# Patient Record
Sex: Female | Born: 1940 | Race: White | Hispanic: No | Smoking: Former smoker
Health system: Southern US, Community
[De-identification: ages and names within clinical notes are randomized; demographics above are authoritative.]

## PROBLEM LIST (undated history)

## (undated) DIAGNOSIS — E7800 Pure hypercholesterolemia, unspecified: Secondary | ICD-10-CM

## (undated) DIAGNOSIS — G8929 Other chronic pain: Secondary | ICD-10-CM

## (undated) DIAGNOSIS — W19XXXA Unspecified fall, initial encounter: Secondary | ICD-10-CM

## (undated) DIAGNOSIS — K219 Gastro-esophageal reflux disease without esophagitis: Secondary | ICD-10-CM

## (undated) DIAGNOSIS — E079 Disorder of thyroid, unspecified: Secondary | ICD-10-CM

## (undated) DIAGNOSIS — M0820 Juvenile rheumatoid arthritis with systemic onset, unspecified site: Secondary | ICD-10-CM

## (undated) DIAGNOSIS — I10 Essential (primary) hypertension: Secondary | ICD-10-CM

## (undated) DIAGNOSIS — M545 Low back pain, unspecified: Secondary | ICD-10-CM

## (undated) DIAGNOSIS — R269 Unspecified abnormalities of gait and mobility: Principal | ICD-10-CM

## (undated) DIAGNOSIS — D649 Anemia, unspecified: Secondary | ICD-10-CM

## (undated) DIAGNOSIS — Z86718 Personal history of other venous thrombosis and embolism: Secondary | ICD-10-CM

## (undated) HISTORY — DX: Unspecified abnormalities of gait and mobility: R26.9

## (undated) HISTORY — DX: Low back pain: M54.5

## (undated) HISTORY — DX: Personal history of other venous thrombosis and embolism: Z86.718

## (undated) HISTORY — DX: Other chronic pain: G89.29

## (undated) HISTORY — DX: Low back pain, unspecified: M54.50

## (undated) HISTORY — PX: CARPAL TUNNEL RELEASE: SHX101

## (undated) HISTORY — PX: TONSILLECTOMY: SUR1361

## (undated) HISTORY — PX: ABDOMINAL HYSTERECTOMY: SHX81

## (undated) HISTORY — PX: SHOULDER SURGERY: SHX246

## (undated) HISTORY — PX: JOINT REPLACEMENT: SHX530

## (undated) HISTORY — PX: APPENDECTOMY: SHX54

---

## 1999-11-13 ENCOUNTER — Encounter: Admission: RE | Admit: 1999-11-13 | Discharge: 1999-11-13 | Payer: Self-pay

## 2000-07-22 ENCOUNTER — Other Ambulatory Visit: Admission: RE | Admit: 2000-07-22 | Discharge: 2000-07-22 | Payer: Self-pay | Admitting: Orthopedic Surgery

## 2002-12-21 ENCOUNTER — Encounter: Payer: Self-pay | Admitting: Orthopedic Surgery

## 2002-12-22 ENCOUNTER — Inpatient Hospital Stay (HOSPITAL_COMMUNITY): Admission: RE | Admit: 2002-12-22 | Discharge: 2002-12-24 | Payer: Self-pay | Admitting: Orthopedic Surgery

## 2002-12-22 ENCOUNTER — Encounter (INDEPENDENT_AMBULATORY_CARE_PROVIDER_SITE_OTHER): Payer: Self-pay | Admitting: Specialist

## 2006-04-22 ENCOUNTER — Ambulatory Visit: Payer: Self-pay | Admitting: Internal Medicine

## 2006-05-13 ENCOUNTER — Ambulatory Visit: Payer: Self-pay | Admitting: Internal Medicine

## 2009-05-14 ENCOUNTER — Emergency Department (HOSPITAL_BASED_OUTPATIENT_CLINIC_OR_DEPARTMENT_OTHER): Admission: EM | Admit: 2009-05-14 | Discharge: 2009-05-14 | Payer: Self-pay | Admitting: Emergency Medicine

## 2010-08-11 ENCOUNTER — Inpatient Hospital Stay (HOSPITAL_COMMUNITY): Admission: RE | Admit: 2010-08-11 | Discharge: 2010-08-15 | Payer: Self-pay | Admitting: Orthopedic Surgery

## 2011-01-08 LAB — URINALYSIS, ROUTINE W REFLEX MICROSCOPIC
Bilirubin Urine: NEGATIVE
Ketones, ur: NEGATIVE mg/dL
Protein, ur: NEGATIVE mg/dL
Urobilinogen, UA: 0.2 mg/dL (ref 0.0–1.0)

## 2011-01-08 LAB — BASIC METABOLIC PANEL
BUN: 11 mg/dL (ref 6–23)
Chloride: 106 mEq/L (ref 96–112)
Glucose, Bld: 92 mg/dL (ref 70–99)
Potassium: 4.6 mEq/L (ref 3.5–5.1)

## 2011-01-08 LAB — CBC
HCT: 37.1 % (ref 36.0–46.0)
MCH: 28 pg (ref 26.0–34.0)
MCHC: 31.8 g/dL (ref 30.0–36.0)
MCV: 88.1 fL (ref 78.0–100.0)
RDW: 15.2 % (ref 11.5–15.5)

## 2011-01-08 LAB — PROTIME-INR
INR: 0.97 (ref 0.00–1.49)
Prothrombin Time: 13.1 seconds (ref 11.6–15.2)

## 2011-01-08 LAB — DIFFERENTIAL
Basophils Absolute: 0.1 10*3/uL (ref 0.0–0.1)
Eosinophils Relative: 3 % (ref 0–5)
Lymphocytes Relative: 33 % (ref 12–46)
Monocytes Absolute: 0.6 10*3/uL (ref 0.1–1.0)

## 2011-01-08 LAB — SURGICAL PCR SCREEN: Staphylococcus aureus: NEGATIVE

## 2011-01-08 LAB — APTT: aPTT: 31 seconds (ref 24–37)

## 2011-01-12 ENCOUNTER — Inpatient Hospital Stay (HOSPITAL_COMMUNITY): Payer: Medicare Other

## 2011-01-12 ENCOUNTER — Inpatient Hospital Stay (HOSPITAL_COMMUNITY)
Admission: RE | Admit: 2011-01-12 | Discharge: 2011-01-15 | DRG: 554 | Disposition: A | Discharge status: Home / Self Care | Payer: Medicare Other | Source: Ambulatory Visit | Attending: Orthopedic Surgery | Admitting: Orthopedic Surgery

## 2011-01-12 DIAGNOSIS — Z7982 Long term (current) use of aspirin: Secondary | ICD-10-CM

## 2011-01-12 DIAGNOSIS — M19019 Primary osteoarthritis, unspecified shoulder: Principal | ICD-10-CM | POA: Diagnosis present

## 2011-01-12 DIAGNOSIS — D62 Acute posthemorrhagic anemia: Secondary | ICD-10-CM | POA: Diagnosis not present

## 2011-01-13 LAB — BASIC METABOLIC PANEL
Calcium: 8.5 mg/dL (ref 8.4–10.5)
Creatinine, Ser: 0.89 mg/dL (ref 0.4–1.2)
GFR calc Af Amer: >60 mL/min (ref >60)
GFR calc non Af Amer: >60 mL/min (ref >60)
Glucose, Bld: 99 mg/dL (ref 70–99)
Sodium: 138 mEq/L (ref 135–145)

## 2011-01-14 LAB — BASIC METABOLIC PANEL
CO2: 25 mEq/L (ref 19–32)
Calcium: 8.4 mg/dL (ref 8.4–10.5)
Creatinine, Ser: 0.81 mg/dL (ref 0.4–1.2)
GFR calc Af Amer: >60 mL/min (ref >60)
GFR calc non Af Amer: >60 mL/min (ref >60)
Sodium: 138 mEq/L (ref 135–145)

## 2011-01-14 LAB — CBC
Hemoglobin: 6.9 g/dL — CL (ref 12.0–15.0)
MCHC: 32.4 g/dL (ref 30.0–36.0)
Platelets: 226 10*3/uL (ref 150–400)

## 2011-01-14 LAB — PREPARE RBC (CROSSMATCH)

## 2011-01-15 LAB — TYPE AND SCREEN: Unit division: 0

## 2011-01-15 LAB — CBC
MCV: 84 fL (ref 78.0–100.0)
Platelets: 222 10*3/uL (ref 150–400)
RBC: 3.51 MIL/uL — ABNORMAL LOW (ref 3.87–5.11)
RDW: 15.8 % — ABNORMAL HIGH (ref 11.5–15.5)
WBC: 8.7 10*3/uL (ref 4.0–10.5)

## 2011-01-23 NOTE — Op Note (Signed)
NAME:  Rhonda Davenport, Rhonda Davenport                ACCOUNT NO.:  [id number]  MEDICAL RECORD NO.:  [id number]           PATIENT TYPE:  I  LOCATION:  5015                         FACILITY:  [facility]  PHYSICIAN:  [name], M.D. DATE OF BIRTH:  07-Feb-1941  DATE OF PROCEDURE:  01/12/2011 DATE OF DISCHARGE:                              OPERATIVE REPORT   PREOPERATIVE DIAGNOSIS:  Right shoulder severe rotator cuff arthropathy.  POSTOPERATIVE DIAGNOSIS:  Right shoulder severe rotator cuff arthropathy.  PROCEDURE PERFORMED:  Right shoulder reverse total shoulder arthroplasty using DePuy Delta Xtend system.  ATTENDING SURGEON:  [name], MD  ASSISTANT:  Donnie Coffin. Dixon, PA  General anesthesia plus interscalene block anesthesia was used.  BLOOD LOSS:  200 mL.  FLUID REPLACEMENT:  Crystalloid 1500 mL.  INSTRUMENT COUNTS:  Correct.  There were no complications.  Perioperative antibiotics were given.  INDICATIONS:  The patient is a 70 year old female with a history of worsening right shoulder pain secondary to severe end-stage right shoulder rotator cuff arthropathy.  The patient has failed an extensive period of conservative management.  She has had a prior reverse performed on her left shoulder and has done well with that.  She now has disabling pain and functional limitations, desiring total shoulder arthroplasty on the right.  Informed consent was obtained.  DESCRIPTION OF PROCEDURE:  After adequate local of anesthesia was achieved, the patient was positioned in the modified beach-chair position.  Right shoulder was sterilely prepped and draped in usual manner.  Deltopectoral incision was started at the coracoid process extending down to the anterior humerus, dissection down through subcu tissues using Bovie.  Cephalic vein was identified and taken laterally to the deltoid.  A 0.5-cm pectoralis was released.  Subscapularis was then released, and the shoulder was delivered out  of the wound.  We went ahead and placed our reamers through the superior humeral head down into the shaft reaming up to size 10.  We then selected the deltopectoral cutting guide, set on 10 degrees of retroversion and made our head cut, placed a Shim to protect the remaining bone on the humerus.  We subluxed the humerus posteriorly and then visualized the socket in a 360 manner. We performed a circumferential capsule release and labral debridement, protecting the axillary nerve.  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  We had to use a bur to bur down the anterior lip of the glenoid and as well the inferior portion of glenoid as well.  We had a good view of the anterior neck of the scapula which was our reference point and basically did our glenoid preparation for the metaglene off that anterior face and also we were able to feel down that inferior scapular neck.  Despite that, it was fairly difficult to place our guidepin in a position that would enable [place] to get a good metaglene preparation, but we positioned that guidepin again referencing the visible anatomy and the palpable anatomy that we had given so much deformity on the glenoid face.  Once we  had that guidepin, we used our reamer and reamed for the metaglene.  We then drilled out our central peg hole.  We did our peripheral reaming and then we placed our metaglene in position.  We had really good fit with the metaglene.  It was just up off the bone a little bit superiorly, but I felt if we had reamed anymore inferior bone we would have risked compromising any anchor point for the metaglene, so we packed bone graft up under that superiorly through the superior hole in the metaglene and packed as high as we could with available bone graft from the head and we secured the metaglene with 36 up on the superior and inferior holes.   They were locked screws and then the 24 locked posteriorly.  We had excellent purchase.  The anterior screw hole was only half on bone and half off. We then selected a 38 neutral glenosphere and screwed that in position. Next, directed our attention towards the humerus and prepared that with metaphyseal reaming using the metaphyseal reamer and placed our trial 10 stem with the size 1 right eccentric metaphysis, screwed into position. We planned that with 10 degrees of retroversion and then reduced the shoulder with first a 38 +3 and then a 36 +6.  The +6 worked a little bit better.  The patient did have some tension when I placed the arm on the stomach lifting the shoulder blade up.  We looked posteriorly and did find some excessive capsule back there that we removed carefully protecting the axillary nerve which we visualized coming around the posterior aspect of the humerus.  Once we had that capsule, it seemed to fit a little bit better with less soft tissue impingement just with the way the glenosphere was positioned.  It was positioned with an up- inclination such that with the arm is at the patient's side there is probably a third or half of the implant just hanging off the inferior part of the glenosphere.  This was quite stable throughout, full range of motion, no gapping, no sulcus.  No opening with external, so we felt like we had a nice stable construct.  Really the glenosphere had to be where it was.  There was no other possibility for stable fixation in any other orientation.  We were pleased with the range of motion.  Again a little tension with the patient's arm came onto her stomach, but the remainder of it no impingement.  Thoroughly irrigated everything, took out our trial size 10, one right, and then implanted the real Press-Fit insert with hydroxyapatite coating, had nice secure fit, trialed again with a +3, +6, and +6 worked the best.  We implanted that, reduced  the shoulder again, happy with soft tissue balance and stability, thoroughly irrigation, and then at this point went in and closed the deltopectoral interval with 0 Vicryl suture followed by 2-0 Vicryl for subcutaneous closure and 4-0 Monocryl for skin.  Steri-Strips were applied followed by a sterile dressing.  The patient tolerated the surgery well.     [name], M.D.     SRN/MEDQ  D:  01/12/2011  T:  01/13/2011  Job:  [id number]  Electronically Signed by Malon Kindle  on 01/23/2011 11:28:36 AM

## 2011-01-23 NOTE — Discharge Summary (Signed)
  NAME:  Rhonda Davenport, Rhonda Davenport                ACCOUNT NO.:  [id number]  MEDICAL RECORD NO.:  [id number]           PATIENT TYPE:  I  LOCATION:  5015                         FACILITY:  [facility]  PHYSICIAN:  [name], M.D. DATE OF BIRTH:  09/16/1941  DATE OF ADMISSION:  01/12/2011 DATE OF DISCHARGE:  01/15/2011                              DISCHARGE SUMMARY   ADMISSION DIAGNOSIS:  Right shoulder rotator cuff arthropathy causing severe shoulder pain.  DISCHARGE DIAGNOSES: 1. Right shoulder pain improved after right total shoulder     arthroplasty. 2. Blood loss anemia.  BRIEF HISTORY:  The patient is a 70 year old female with worsening right shoulder pain secondary to rotator cuff arthropathy.  The patient elected for surgery to decrease pain and increase function.  PROCEDURE:  The patient had a right total shoulder arthroplasty reversed by Dr. Malon Kindle on January 12, 2011.  Assistant was [employer], PA-C.  General anesthesia was used.  No complications.  HOSPITAL COURSE:  The patient was admitted on January 12, 2011, for the above-stated procedure which she tolerated well.  After adequate time in the Postanesthesia Anesthesia Care Unit, she was transferred to 5000. On postop day #1, the patient complained of minimal to moderate pain in the right shoulder, was otherwise stable.  On postop day #2, the patient did drop her blood counts down to about 6.9.  Thus, she did have a transfusion of 2 units, feeling much better on postop day #3 with a hemoglobin of 10.  The patient was stable and asymptomatic. Neurologically, she was intact.  She was able to work with occupational therapy quite well and wound was healing well.  DISCHARGE/PLAN:  The patient will be discharged home with home health physical therapy on January 15, 2011.  CONDITION:  Stable.  DIET:  Regular.  FOLLOWUP:  The patient will follow back up with Dr. Malon Kindle in 2 weeks.  The patient does have an allergy  to CODEINE.  DISCHARGE MEDICATIONS: 1. Aspirin 81 mg daily. 2. Os-Cal 1/2 tablet b.i.d. 3. Klonopin 1 mg nightly. 4. Folic acid 1 mg daily. 5. Levothyroxine 50 mcg daily. 6. Loxapine 10 mg nightly. 7. Robaxin 500 mg p.o. q.6 h. 8. Paxil 40 mg nightly. 9. Zocor 20 mg daily. 10.Ambien 10 mg nightly. 11.Norco 5/325 one to two tablets q.4-6 h. p.r.n. pain.     Thomas B. Dixon, P.A.   ______________________________ [name], M.D.    TBD/MEDQ  D:  01/15/2011  T:  01/15/2011  Job:  [id number]  Electronically Signed by Standley Dakins P.A. on 01/16/2011 10:35:03 AM Electronically Signed by Malon Kindle  on 01/23/2011 11:28:30 AM

## 2011-01-23 NOTE — H&P (Signed)
  NAME:  Rhonda Davenport, Moxie                ACCOUNT NO.:  [id number]  MEDICAL RECORD NO.:  [id number]           PATIENT TYPE:  LOCATION:                                 FACILITY:  PHYSICIAN:  [name], M.D. DATE OF BIRTH:  April 01, 1941  DATE OF ADMISSION: DATE OF DISCHARGE:                             HISTORY & PHYSICAL   CHIEF COMPLAINTS:  Right shoulder pain.  HISTORY OF PRESENT ILLNESS:  The patient is a 70 year old female with worsening right shoulder pain secondary to rotator cuff arthropathy. The patient has elected to have a right total shoulder arthroplasty reversed by Dr. Malon Kindle to decrease pain and increase function of the right upper extremity.  PAST MEDICAL HISTORY:  Hypertension, anxiety, rheumatoid arthritis.  FAMILY MEDICAL HISTORY:  Coronary artery disease.  SOCIAL HISTORY:  Patient of Dr. Suszanne Conners.  Does not smoke or use alcohol.  The patient does have allergies to CODEINE.  Current medications include Synthroid, Paxil, and Ambien, unknown dosages.  Review of systems shows pain on range of motion of the right upper extremity.  PHYSICAL EXAMINATION:  VITAL SIGNS:  Pulse 73, respirations 16, blood pressure 108/72. GENERAL:  The patient is a healthy-appearing 70 year old female in acute distress.  Pleasant mood and affect.  Alert and oriented x3. HEAD AND NECK:  Cranial nerves II through XII grossly intact.  She has full range of motion without any tenderness.  She has active breath sounds bilaterally with no wheezes, rhonchi, or rales. HEART:  Regular rate and rhythm.  No murmur. ABDOMEN:  Nontender, nondistended with active bowel sounds. EXTREMITIES:  Moderate tenderness and weakness of the right upper extremity and shoulder region with limited strength, 4/5. NEUROLOGIC:  Otherwise, she is intact. SKIN:  No rashes, and she has no pedal edema.  X-rays show end-stage osteoarthritis and rotator cuff arthropathy.  IMPRESSION:  Right shoulder pain  secondary to rotator cuff arthropathy.  PLAN OF ACTION:  Right total shoulder arthroplasty reversed by Dr. Malon Kindle.     Thomas B. Dixon, P.A.   ______________________________ [name], M.D.    TBD/MEDQ  D:  12/26/2010  T:  12/27/2010  Job:  [id number]  Electronically Signed by Standley Dakins P.A. on 01/02/2011 09:55:26 AM Electronically Signed by Malon Kindle  on 01/23/2011 11:28:34 AM

## 2011-02-22 LAB — BASIC METABOLIC PANEL
CO2: 26 mEq/L (ref 19–32)
CO2: 24 mEq/L (ref 19–32)
Calcium: 8.2 mg/dL — ABNORMAL LOW (ref 8.4–10.5)
Chloride: 106 mEq/L (ref 96–112)
Chloride: 109 mEq/L (ref 96–112)
GFR calc Af Amer: >60 mL/min (ref >60)
GFR calc Af Amer: >60 mL/min (ref >60)
GFR calc Af Amer: >60 mL/min (ref >60)
GFR calc non Af Amer: >60 mL/min (ref >60)
GFR calc non Af Amer: >60 mL/min (ref >60)
Glucose, Bld: 133 mg/dL — ABNORMAL HIGH (ref 70–99)
Glucose, Bld: 93 mg/dL (ref 70–99)
Potassium: 3.4 mEq/L — ABNORMAL LOW (ref 3.5–5.1)
Potassium: 3.7 mEq/L (ref 3.5–5.1)
Sodium: 135 mEq/L (ref 135–145)
Sodium: 136 mEq/L (ref 135–145)
Sodium: 138 mEq/L (ref 135–145)

## 2011-02-22 LAB — DIFFERENTIAL
Basophils Relative: 1 % (ref 0–1)
Eosinophils Absolute: 0.1 10*3/uL (ref 0.0–0.7)
Eosinophils Relative: 2 % (ref 0–5)
Lymphs Abs: 1.4 10*3/uL (ref 0.7–4.0)
Monocytes Absolute: 0.7 10*3/uL (ref 0.1–1.0)
Monocytes Relative: 12 % (ref 3–12)

## 2011-02-22 LAB — CBC
HCT: 27.2 % — ABNORMAL LOW (ref 36.0–46.0)
HCT: 29.5 % — ABNORMAL LOW (ref 36.0–46.0)
Hemoglobin: 9.2 g/dL — ABNORMAL LOW (ref 12.0–15.0)
Hemoglobin: 12.8 g/dL (ref 12.0–15.0)
MCH: 30.6 pg (ref 26.0–34.0)
MCH: 30.8 pg (ref 26.0–34.0)
MCHC: 33.8 g/dL (ref 30.0–36.0)
MCHC: 32.9 g/dL (ref 30.0–36.0)
MCV: 92.5 fL (ref 78.0–100.0)
MCV: 93.5 fL (ref 78.0–100.0)
RBC: 3.01 MIL/uL — ABNORMAL LOW (ref 3.87–5.11)
RBC: 3.19 MIL/uL — ABNORMAL LOW (ref 3.87–5.11)
RBC: 4.16 MIL/uL (ref 3.87–5.11)
RDW: 13.5 % (ref 11.5–15.5)
WBC: 8.2 10*3/uL (ref 4.0–10.5)

## 2011-02-22 LAB — URINALYSIS, ROUTINE W REFLEX MICROSCOPIC
Glucose, UA: NEGATIVE mg/dL
Hgb urine dipstick: NEGATIVE
Specific Gravity, Urine: 1.02 (ref 1.005–1.030)
Urobilinogen, UA: 0.2 mg/dL (ref 0.0–1.0)
pH: 5.5 (ref 5.0–8.0)

## 2011-02-22 LAB — SURGICAL PCR SCREEN: Staphylococcus aureus: NEGATIVE

## 2011-02-22 LAB — TYPE AND SCREEN
ABO/RH(D): O POS
Antibody Screen: NEGATIVE

## 2011-02-22 LAB — URINE MICROSCOPIC-ADD ON

## 2011-04-05 ENCOUNTER — Other Ambulatory Visit: Payer: Self-pay | Admitting: Orthopedic Surgery

## 2011-04-05 DIAGNOSIS — M25512 Pain in left shoulder: Secondary | ICD-10-CM

## 2011-04-06 ENCOUNTER — Ambulatory Visit
Admission: RE | Admit: 2011-04-06 | Discharge: 2011-04-06 | Disposition: A | Discharge status: Home / Self Care | Payer: Medicare Other | Source: Ambulatory Visit | Attending: Orthopedic Surgery | Admitting: Orthopedic Surgery

## 2011-04-06 DIAGNOSIS — M25512 Pain in left shoulder: Secondary | ICD-10-CM

## 2011-04-27 NOTE — Op Note (Signed)
NAME:  Rhonda Davenport, Rhonda Davenport                          ACCOUNT NO.:  [id number]   MEDICAL RECORD NO.:  [id number]                   PATIENT TYPE:  INP   LOCATION:  5002                                 FACILITY:  [facility]   PHYSICIAN:  Dionne Ano. Everlene Other, M.D.         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   PROCEDURES:  1. Tenosynovectomy of flexor carpi ulnaris tendon sheath of the right wrist.  2. Tenosynovectomy of the third dorsal compartment of the right wrist.  3. Tenosynovectomy of the second dorsal compartment of the right wrist.  4. Tenosynovectomy of the fourth dorsal compartment of the right wrist.  5. Right wrist fusion with Steinmann pin fixation.  6. Synovectomy of the right wrist.  7. Stress radiography of the right wrist.   SURGEON:  Dionne Ano. Amanda Pea, M.D.   ASSISTANT:  Karie Chimera, P.A.-C.   ANESTHESIA:  Axillary/infraclavicular block performed by Quita Skye. Krista Blue,  M.D., and IV sedation.   COMPLICATIONS:  None.   DRAINS:  Two.   SPECIMENS:  Cultures and specimen were sent of the diseased tissue.   TOURNIQUET TIME:  Approximately one hour.   INDICATIONS FOR THE PROCEDURE:  This patient is a very pleasant female who  presented with the above-mentioned diagnosis.  I have counseled in regards  to the preoperatively risks and benefits, including bleeding, infection,  anesthesia, damage to structures, and failure of surgery and possible need  for resurgery,  and tendinosis and ligament symptoms that may start  infection. The patient understands  and agrees to proceed.  All questions  have been encouraged and answered preoperatively.   OPERATIVE FINDINGS:  This patient had two masses about the wrist, one dorsal  and one volar ulna in location.  These appear to represent a combination of  thrombosed vein and inflammation.  I sent segments and tissue for specimen  and culture.  These have been present for quite some time and she does have  a history of superficial thrombophlebitis in this region.  The patient also  underwent tenosynovectomies about the ECU and second, third, and fourth  dorsal compartments.  She underwent synovectomy and right wrist fusion  without difficulty.  She had a significant amount of loss of bone with  erosion typical of rheumatologic disease.  She underwent bone grafting with  allograft bone graft without difficulty.  This was in the form of cancellous  bone chips and ostial inductive bone putty from [employer].   DESCRIPTION OF PROCEDURE:  The patient underwent general anesthesia and was  taken to the operative suite.  She was counseled in the holding area.  An  infraclavicular block was placed.  Preoperative antibiotics were given.  Permit signed.  Labs check.  All questions encouraged and answered.  She was  then taken to the operative suite.  She underwent appropriate padding.  She  was then laid supine.  She underwent a prep and drape about the right upper  extremity with tourniquet application.  The patient then underwent sterile  draping.  Following this, she had the tourniquet insufflated to 250 mmHg.  An incision was made in volar ulnar about the wrist.  Dissection was carried  down.  A thrombosed area was noted with gelatinous red fluid in the area.  This was cultured and sent for specimen.  I sent a portion of this also for  pathology.  It appeared to be a combination of tenosynovitis and portions of  a thrombosed vein.  I performed incision of the FCU sheath, decompressed  this, and removed all  tenosynovitic mass.  The dorsal sensory branch of the  ulnar nerve was intimately involved in this mass and stretched.  I noted  this as well. The nerve appeared to be very afraid and ragged and it was  treated with a barium procedure.  There was a portion of it where a jagged  end was noted.  I delicately handled the tissue at all times, creating full  flaps, I should note.  The distal radial ulnar preoperatively did not have  significant end-stage disease and thus we did not resect it.  I did perform  a tenosynovectomy in this area as well.  Following this, I deflated the  tourniquet and assured hemostasis.  I then placed a drain in this area and  loosely closed it.  I then made a dorsal radial incision.  Immediately  another area of thrombosed vein and tenosynovitis was noted.  I sent this  for specimen and pathology as well and then proceeded with culture and  specimen of this.  Following this, I accessed the fourth, third, and second  dorsal compartments.  The ETL tendon sheath was opened.  The ETL was  transposed anteriorly.  Following this, the patient had access to the wrist  joint accomplished.  Once this was done, I performed a synovectomy of the  wrist joint.  Following this, the ELB and ECRL underwent a tenosynovectomy  about the second dorsal compartment.  The ETL had previously undergone a  tenosynovectomy.  I then directed efforts at the fourth dorsal compartment,  leaving an area of retinaculum above it to prevent bow stringing  postoperatively.  The fourth dorsal compartment underwent a tenosynovectomy  without difficulty and I was pleased with this following this.  I then  performed additional synovectomy of the wrist joint itself.  The T capsule  incision allowed access to the joint.  I then prepared the surfaces and  remained any remaining cartilage.  There was a large amount of defect in the  radial styloid region indicative of inflammatory arthritis.  The bone  was crushed with rongeur.  Following this, I placed cancellus bone chips and  [employer] ostial inductive bone putty.  This filled the void well.  I then  made an incision over the second web space and introduced a large Steinmann  pin.  The Steinmann pin well-appearing placed between the second and third  metacarpals, went through the wrist carpals, and then engaged the radius.  I  seated this nicely and  it had firm excellent fixation.  Given the small  diameter of her wrist, I do not feel that additional fixation would be  necessary or worthy given the fact that it could crack her bone.  She had a  small canal diameter.  With this noted, we then recessed the pin and  deflated the tourniquet.  We packed additional bone in the area and then  took x-rays, which looked excellent in the AP and lateral views.  I was  pleased with the neutral position of the wrist and the hemostasis.  I then  closed the T capsule and following this irrigated the wounds copiously.  I  took a final copy of the x-rays and performed stress radiography without  difficulty.  Once this was done, we then closed the retinaculum  appropriately, ensured that there was no catching of the tendons in this,  and then closed the wound with Vicryl followed by Prolene in the skin edges  in an interrupted fashion over a drain.  The volar ulna wound was closed  similarly over a drain.  Prior to this, hemostasis was noted to be  excellent.  I was pleased with this and closed the incision between the  second and third metacarpal heads with simple sutures.  She had excellent  refill of soft compartments.  She was placed in a long-arm splint without  difficulty.  She was then transferred to the recovery room where she was  noted to  be stable condition.  All sponge, needle, and instrument counts were  reported as correct.  The patient will be monitored closely.  She will be  given IV antibiotics, OT consult, and postoperative measures.   We have  discussed with her all issues and all questions have been encouraged and  answered.                                               Dionne Ano. Everlene Other, M.D.    Nash Mantis  D:  12/24/2002  T:  12/24/2002  Job:  [id number]   cc:   Areatha Keas, M.D.  [address]  [address]  [place]  [place] [address]  Fax: [phone]   [facility]

## 2012-12-19 ENCOUNTER — Emergency Department (HOSPITAL_BASED_OUTPATIENT_CLINIC_OR_DEPARTMENT_OTHER)
Admission: EM | Admit: 2012-12-19 | Discharge: 2012-12-19 | Disposition: A | Discharge status: Home / Self Care | Payer: Medicare Other | Attending: Emergency Medicine | Admitting: Emergency Medicine

## 2012-12-19 ENCOUNTER — Emergency Department (HOSPITAL_BASED_OUTPATIENT_CLINIC_OR_DEPARTMENT_OTHER): Payer: Medicare Other

## 2012-12-19 ENCOUNTER — Encounter (HOSPITAL_BASED_OUTPATIENT_CLINIC_OR_DEPARTMENT_OTHER): Payer: Self-pay | Admitting: Emergency Medicine

## 2012-12-19 DIAGNOSIS — Z862 Personal history of diseases of the blood and blood-forming organs and certain disorders involving the immune mechanism: Secondary | ICD-10-CM | POA: Insufficient documentation

## 2012-12-19 DIAGNOSIS — Z87891 Personal history of nicotine dependence: Secondary | ICD-10-CM | POA: Insufficient documentation

## 2012-12-19 DIAGNOSIS — Z79899 Other long term (current) drug therapy: Secondary | ICD-10-CM | POA: Insufficient documentation

## 2012-12-19 DIAGNOSIS — J40 Bronchitis, not specified as acute or chronic: Secondary | ICD-10-CM

## 2012-12-19 DIAGNOSIS — Z90710 Acquired absence of both cervix and uterus: Secondary | ICD-10-CM | POA: Insufficient documentation

## 2012-12-19 DIAGNOSIS — R059 Cough, unspecified: Secondary | ICD-10-CM | POA: Insufficient documentation

## 2012-12-19 DIAGNOSIS — R062 Wheezing: Secondary | ICD-10-CM | POA: Insufficient documentation

## 2012-12-19 DIAGNOSIS — R05 Cough: Secondary | ICD-10-CM | POA: Insufficient documentation

## 2012-12-19 DIAGNOSIS — Z8739 Personal history of other diseases of the musculoskeletal system and connective tissue: Secondary | ICD-10-CM | POA: Insufficient documentation

## 2012-12-19 DIAGNOSIS — E079 Disorder of thyroid, unspecified: Secondary | ICD-10-CM | POA: Insufficient documentation

## 2012-12-19 DIAGNOSIS — I10 Essential (primary) hypertension: Secondary | ICD-10-CM | POA: Insufficient documentation

## 2012-12-19 DIAGNOSIS — J45909 Unspecified asthma, uncomplicated: Secondary | ICD-10-CM | POA: Insufficient documentation

## 2012-12-19 DIAGNOSIS — E7800 Pure hypercholesterolemia, unspecified: Secondary | ICD-10-CM | POA: Insufficient documentation

## 2012-12-19 DIAGNOSIS — K219 Gastro-esophageal reflux disease without esophagitis: Secondary | ICD-10-CM | POA: Insufficient documentation

## 2012-12-19 DIAGNOSIS — R079 Chest pain, unspecified: Secondary | ICD-10-CM | POA: Insufficient documentation

## 2012-12-19 HISTORY — DX: Anemia, unspecified: D64.9

## 2012-12-19 HISTORY — DX: Disorder of thyroid, unspecified: E07.9

## 2012-12-19 HISTORY — DX: Juvenile rheumatoid arthritis with systemic onset, unspecified site: M08.20

## 2012-12-19 HISTORY — DX: Pure hypercholesterolemia, unspecified: E78.00

## 2012-12-19 HISTORY — DX: Gastro-esophageal reflux disease without esophagitis: K21.9

## 2012-12-19 HISTORY — DX: Essential (primary) hypertension: I10

## 2012-12-19 LAB — COMPREHENSIVE METABOLIC PANEL
ALT: 9 U/L (ref 0–35)
Albumin: 3.8 g/dL (ref 3.5–5.2)
Calcium: 9.1 mg/dL (ref 8.4–10.5)
GFR calc Af Amer: 64 mL/min — ABNORMAL LOW (ref >90)
Glucose, Bld: 159 mg/dL — ABNORMAL HIGH (ref 70–99)
Potassium: 3.9 mEq/L (ref 3.5–5.1)
Sodium: 141 mEq/L (ref 135–145)
Total Protein: 7.4 g/dL (ref 6.0–8.3)

## 2012-12-19 LAB — CBC WITH DIFFERENTIAL/PLATELET
Eosinophils Absolute: 0 10*3/uL (ref 0.0–0.7)
Eosinophils Relative: 0 % (ref 0–5)
Lymphs Abs: 0.9 10*3/uL (ref 0.7–4.0)
MCH: 22.7 pg — ABNORMAL LOW (ref 26.0–34.0)
MCHC: 30.4 g/dL (ref 30.0–36.0)
MCV: 74.8 fL — ABNORMAL LOW (ref 78.0–100.0)
Monocytes Absolute: 0.6 10*3/uL (ref 0.1–1.0)
Neutrophils Relative %: 82 % — ABNORMAL HIGH (ref 43–77)
Platelets: 376 10*3/uL (ref 150–400)
RDW: 16.7 % — ABNORMAL HIGH (ref 11.5–15.5)

## 2012-12-19 MED ORDER — ALBUTEROL SULFATE (5 MG/ML) 0.5% IN NEBU
2.5000 mg | INHALATION_SOLUTION | Freq: Once | RESPIRATORY_TRACT | Status: AC
  Administered 2012-12-19: 2.5 mg via RESPIRATORY_TRACT

## 2012-12-19 MED ORDER — IPRATROPIUM BROMIDE 0.02 % IN SOLN
RESPIRATORY_TRACT | Status: AC
  Administered 2012-12-19: 0.5 mg
  Filled 2012-12-19: qty 2.5

## 2012-12-19 MED ORDER — ALBUTEROL SULFATE HFA 108 (90 BASE) MCG/ACT IN AERS
2.0000 | INHALATION_SPRAY | Freq: Once | RESPIRATORY_TRACT | Status: AC
  Administered 2012-12-19: 2 via RESPIRATORY_TRACT
  Filled 2012-12-19: qty 6.7

## 2012-12-19 MED ORDER — METHYLPREDNISOLONE SODIUM SUCC 125 MG IJ SOLR
125.0000 mg | Freq: Once | INTRAMUSCULAR | Status: AC
  Administered 2012-12-19: 125 mg via INTRAVENOUS
  Filled 2012-12-19: qty 2

## 2012-12-19 MED ORDER — ALBUTEROL SULFATE (5 MG/ML) 0.5% IN NEBU
INHALATION_SOLUTION | RESPIRATORY_TRACT | Status: AC
  Administered 2012-12-19: 2.5 mg via RESPIRATORY_TRACT
  Filled 2012-12-19: qty 0.5

## 2012-12-19 MED ORDER — ALBUTEROL SULFATE (5 MG/ML) 0.5% IN NEBU
INHALATION_SOLUTION | RESPIRATORY_TRACT | Status: AC
  Administered 2012-12-19: 5 mg
  Filled 2012-12-19: qty 1

## 2012-12-19 NOTE — Patient Instructions (Signed)
Instructed pt on the proper use of administering albuteral mdi via aerochamber pt tolerated well 

## 2012-12-19 NOTE — ED Provider Notes (Signed)
History     CSN: [id number]  Arrival date & time 12/19/12  1742   First MD Initiated Contact with Patient 12/19/12 1815      Chief Complaint  Patient presents with  . Shortness of Breath  . Cough    (Consider location/radiation/quality/duration/timing/severity/associated sxs/prior treatment) Patient is a 72 y.o. female presenting with cough. The history is provided by the patient. No language interpreter was used.  Cough This is a new problem. The current episode started 2 days ago. The problem occurs constantly. The problem has been gradually worsening. The cough is non-productive. There has been no fever. Associated symptoms include chest pain, shortness of breath and wheezing. She has tried nothing for the symptoms. The treatment provided moderate relief. She is not a smoker. Her past medical history is significant for asthma.  Pt began having shortness of breath and asthma problems. After surgery in September.   Pt is on zithromax and prednisone.   Pt has inhaler but is expired  Past Medical History  Diagnosis Date  . Hypertension   . High cholesterol   . Still's disease   . GERD (gastroesophageal reflux disease)   . Anemia   . Thyroid disease     Past Surgical History  Procedure Date  . Arm surgery   . Joint replacement   . Abdominal hysterectomy   . Appendectomy     No family history on file.  History  Substance Use Topics  . Smoking status: Former Games developer  . Smokeless tobacco: Never Used  . Alcohol Use: No    OB History    Grav Para Term Preterm Abortions TAB SAB Ect Mult Living                  Review of Systems  Respiratory: Positive for cough, shortness of breath and wheezing.   Cardiovascular: Positive for chest pain.  All other systems reviewed and are negative.    Allergies  Codeine  Home Medications   Current Outpatient Rx  Name  Route  Sig  Dispense  Refill  . AZITHROMYCIN 1 G PO PACK   Oral   Take 1 packet by mouth once.           Marland Kitchen CALCIUM CITRATE 950 MG PO TABS   Oral   Take 1 tablet by mouth daily.         Marland Kitchen CLONAZEPAM 1 MG PO TABS   Oral   Take 1 mg by mouth 2 (two) times daily as needed.         Marland Kitchen FOLIC ACID 20 MG PO CAPS   Oral   Take 20 mg by mouth daily.         Marland Kitchen HYDROCODONE-ACETAMINOPHEN 5-500 MG PO CAPS   Oral   Take 1 capsule by mouth every 6 (six) hours as needed.         Marland Kitchen POLYSACCHARIDE IRON COMPLEX 150 MG PO CAPS   Oral   Take 150 mg by mouth 2 (two) times daily.         Marland Kitchen LEVOTHYROXINE SODIUM 50 MCG PO TABS   Oral   Take 50 mcg by mouth daily.         Marland Kitchen LOVASTATIN 40 MG PO TABS   Oral   Take 40 mg by mouth at bedtime.         Marland Kitchen METOPROLOL TARTRATE 5 MG/5ML IV SOLN   Intravenous   Inject into the vein daily.         Marland Kitchen  NAPROXEN 500 MG PO TABS   Oral   Take 500 mg by mouth daily as needed.         . OMEGA-3 EPA FISH OIL PO   Oral   Take 980-1,400 mg by mouth daily.         Marland Kitchen OMEPRAZOLE 40 MG PO CPDR   Oral   Take 40 mg by mouth daily.         Marland Kitchen PAROXETINE HCL 40 MG PO TABS   Oral   Take 40 mg by mouth every morning.         Marland Kitchen PERPHENAZINE 2 MG PO TABS   Oral   Take 2 mg by mouth at bedtime.         Marland Kitchen PREDNISONE 10 MG PO TABS   Oral   Take 10 mg by mouth daily.         Marland Kitchen ZOLPIDEM TARTRATE 10 MG PO TABS   Oral   Take 10 mg by mouth at bedtime as needed.           BP 148/80  Pulse 77  Temp 99 F (37.2 C) (Oral)  Resp 24  Ht 4\' 11"  (1.499 m)  Wt 101 lb (45.813 kg)  BMI 20.40 kg/m2  SpO2 98%  Physical Exam  Nursing note and vitals reviewed. Constitutional: She is oriented to person, place, and time. She appears well-developed and well-nourished.  HENT:  Head: Normocephalic.  Right Ear: External ear normal.  Left Ear: External ear normal.  Nose: Nose normal.  Mouth/Throat: Oropharynx is clear and moist.  Eyes: Conjunctivae normal are normal. Pupils are equal, round, and reactive to light.  Neck: Normal range of motion.  Neck supple.  Cardiovascular: Normal rate and normal heart sounds.   Pulmonary/Chest: Effort normal. She has wheezes.  Abdominal: Soft.  Neurological: She is alert and oriented to person, place, and time.  Skin: Skin is warm.  Psychiatric: She has a normal mood and affect.    ED Course  Procedures (including critical care time)  Labs Reviewed - No data to display No results found.   1. Bronchitis       MDM  Pt has some relief with albuterol,  Continued expiratory wheezing.   Pt much improved after 2nd neb,   Pt given IV solumedrol.   Pt drank ice water and reports some increased shortness of breath.  Pt given albuterol inhaler  2 puffs,   Pt advised recheck with primary on Monday.         Lonia Skinner [place], [place] 12/19/12 2159  Lonia Skinner [place], [place] 12/19/12 2203

## 2012-12-19 NOTE — ED Notes (Signed)
Ok to give po fluids per K. Sophia PA

## 2012-12-19 NOTE — ED Notes (Signed)
PA at bedside.

## 2012-12-19 NOTE — ED Notes (Signed)
Dx w/ "bronchitis" x 2 weeks ago. Increased difficulty breathing x 2 hrs ago. Also c/o nonproductive cough. Pt currently on Prednisone and Z-Pack.

## 2012-12-20 NOTE — ED Provider Notes (Signed)
Medical screening examination/treatment/procedure(s) were performed by non-physician practitioner and as supervising physician I was immediately available for consultation/collaboration.    Nelia Shi, MD 12/20/12 [phone]

## 2012-12-25 ENCOUNTER — Ambulatory Visit (INDEPENDENT_AMBULATORY_CARE_PROVIDER_SITE_OTHER): Payer: Medicare Other | Admitting: Internal Medicine

## 2012-12-25 ENCOUNTER — Encounter: Payer: Self-pay | Admitting: Internal Medicine

## 2012-12-25 VITALS — BP 122/82 | HR 99 | Temp 98.7°F | Ht 59.0 in | Wt 121.2 lb

## 2012-12-25 DIAGNOSIS — R05 Cough: Secondary | ICD-10-CM | POA: Insufficient documentation

## 2012-12-25 MED ORDER — FAMOTIDINE 20 MG PO TABS: ORAL_TABLET | ORAL | Status: DC

## 2012-12-25 MED ORDER — MOMETASONE FURO-FORMOTEROL FUM 100-5 MCG/ACT IN AERO: INHALATION_SPRAY | RESPIRATORY_TRACT | Status: DC

## 2012-12-25 MED ORDER — OMEPRAZOLE 40 MG PO CPDR: 40.0000 mg | DELAYED_RELEASE_CAPSULE | Freq: Every day | ORAL | Status: DC

## 2012-12-25 NOTE — Progress Notes (Signed)
  Subjective:    Patient ID: Rhonda Davenport, female    DOB: [birth year]  MRN: [id number]  HPI  11 yowf quit smoking in 1984 with pattern recurrent cough "all her life" seemed sine better p quit smoking but still needed to see Dr several times a year for same problem referred Dr Derrell Lolling with recurrent cough since Dec 2013.   12/25/2012  1st pulmonary ov/ acute onset mid Dec 2013 cough with chest tightness and no mucus production present daily worse when head hits pillow and interferes with sleep with 3 visits > 0 better including p ventolin hfa and prednisone. Mostly sob just when coughing. Did have sore throat but his is better.  No better p saba but note technique quite poor (see a/p)  Sleeping ok without nocturnal  or early am exacerbation  of respiratory  c/o's or need for noct saba. Also denies any obvious fluctuation of symptoms with weather or environmental changes or other aggravating or alleviating factors except as outlined above   Review of Systems  Constitutional: Negative for fever, chills and unexpected weight change.  HENT: Positive for sore throat. Negative for ear pain, nosebleeds, congestion, rhinorrhea, sneezing, trouble swallowing, dental problem, voice change, postnasal drip and sinus pressure.   Eyes: Negative for visual disturbance.  Respiratory: Positive for cough and shortness of breath. Negative for choking.   Cardiovascular: Negative for chest pain and leg swelling.  Gastrointestinal: Negative for vomiting, abdominal pain and diarrhea.  Genitourinary: Negative for difficulty urinating.  Musculoskeletal: Positive for arthralgias.  Skin: Negative for rash.  Neurological: Negative for tremors, syncope and headaches.  Hematological: Does not bruise/bleed easily.       Objective:   Physical Exam  amb wf nad with classic pseudowheeze who  failed to answer a single question asked in a straightforward manner, tending to go off on tangents or answer questions with ambiguous  medical terms or diagnoses ("bronchitis" "pneumonia"and seemed genuinely perplexed  when asked the same question more than once for clarification. )  HEENT mild turbinate edema.  Oropharynx no thrush or excess pnd or cobblestoning.  No JVD or cervical adenopathy. Mild accessory muscle hypertrophy. Trachea midline, nl thryroid. Chest was hyperinflated by percussion with diminished breath sounds and moderate increased exp time without wheeze. Hoover sign positive at mid inspiration. Regular rate and rhythm without murmur gallop or rub or increase P2 or edema.  Abd: no hsm, nl excursion. Ext warm without cyanosis or clubbing.   cxr reported nl 12/19/12      Assessment & Plan:

## 2012-12-25 NOTE — Assessment & Plan Note (Addendum)
The most common causes of chronic cough in immunocompetent adults include the following: upper airway cough syndrome (UACS), previously referred to as postnasal drip syndrome (PNDS), which is caused by variety of rhinosinus conditions; (2) asthma; (3) GERD; (4) chronic bronchitis from cigarette smoking or other inhaled environmental irritants; (5) nonasthmatic eosinophilic bronchitis; and (6) bronchiectasis.   These conditions, singly or in combination, have accounted for up to 94% of the causes of chronic cough in prospective studies.   Other conditions have constituted no >6% of the causes in prospective studies These have included bronchogenic carcinoma, chronic interstitial pneumonia, sarcoidosis, left ventricular failure, ACEI-induced cough, and aspiration from a condition associated with pharyngeal dysfunction.    Chronic cough is often simultaneously caused by more than one condition. A single cause has been found from 38 to 82% of the time, multiple causes from 18 to 62%. Multiply caused cough has been the result of three diseases up to 42% of the time.       ? If this is  Classic Upper airway cough syndrome, so named because it's frequently impossible to sort out how much is  CR/sinusitis with freq throat clearing (which can be related to primary GERD)   vs  causing  secondary (" extra esophageal")  GERD from wide swings in gastric pressure that occur with throat clearing, often  promoting self use of mint and menthol lozenges that reduce the lower esophageal sphincter tone and exacerbate the problem further in a cyclical fashion.   These are the same pts (now being labeled as having "irritable larynx syndrome" by some cough centers) who not infrequently have a history of having failed to tolerate ace inhibitors,  dry powder inhalers or biphosphonates or report having atypical reflux symptoms that don't respond to standard doses of PPI , and are easily confused as having aecopd or asthma flares  by even experienced allergists/ pulmonologists.   Since hasn't really tried saba yet due to poor technique worthwhile giving a trial of dulera 100 2 bid while max rx of gerd.  If better in 2 weeks would do trial off dulera but on gerd rx to see if flares again ( the reverse of a therapeutic trial)

## 2012-12-25 NOTE — Patient Instructions (Addendum)
Dulera 100 (blue inhaler) 100 Take 2 puffs first thing in am and then another 2 puffs about 12 hours later.   Work on inhaler technique:  relax and gently blow all the way out then take a nice smooth deep breath back in, triggering the inhaler at same time you start breathing in.  Hold for up to 5 seconds if you can.  Rinse and gargle with water when done   If your mouth or throat starts to bother you,   I suggest you time the inhaler to your dental care and after using the inhaler(s) brush teeth and tongue with a baking soda containing toothpaste and when you rinse this out, gargle with it first to see if this helps your mouth and throat.     Try prilosec (omeprazole) 40mg   Take 30-60 min before first meal of the day and Pepcid (famotidine) 20 mg one bedtime until cough is completely gone for at least a week without the need for cough suppression  For cough use lorcet take one every 4 hours  Follow up: See Tammy in [place] in 2 weeks

## 2013-01-13 ENCOUNTER — Ambulatory Visit: Payer: Medicare Other | Admitting: Adult Health

## 2013-02-03 ENCOUNTER — Ambulatory Visit: Payer: Medicare Other | Admitting: Adult Health

## 2013-02-28 ENCOUNTER — Emergency Department (HOSPITAL_BASED_OUTPATIENT_CLINIC_OR_DEPARTMENT_OTHER)
Admission: EM | Admit: 2013-02-28 | Discharge: 2013-02-28 | Disposition: A | Discharge status: Home / Self Care | Payer: Medicare Other | Attending: Emergency Medicine | Admitting: Emergency Medicine

## 2013-02-28 ENCOUNTER — Emergency Department (HOSPITAL_BASED_OUTPATIENT_CLINIC_OR_DEPARTMENT_OTHER): Payer: Medicare Other

## 2013-02-28 ENCOUNTER — Encounter (HOSPITAL_BASED_OUTPATIENT_CLINIC_OR_DEPARTMENT_OTHER): Payer: Self-pay | Admitting: *Deleted

## 2013-02-28 DIAGNOSIS — E7800 Pure hypercholesterolemia, unspecified: Secondary | ICD-10-CM | POA: Insufficient documentation

## 2013-02-28 DIAGNOSIS — I10 Essential (primary) hypertension: Secondary | ICD-10-CM | POA: Insufficient documentation

## 2013-02-28 DIAGNOSIS — E079 Disorder of thyroid, unspecified: Secondary | ICD-10-CM | POA: Insufficient documentation

## 2013-02-28 DIAGNOSIS — R42 Dizziness and giddiness: Secondary | ICD-10-CM | POA: Insufficient documentation

## 2013-02-28 DIAGNOSIS — D649 Anemia, unspecified: Secondary | ICD-10-CM | POA: Insufficient documentation

## 2013-02-28 DIAGNOSIS — Z86718 Personal history of other venous thrombosis and embolism: Secondary | ICD-10-CM | POA: Insufficient documentation

## 2013-02-28 DIAGNOSIS — K219 Gastro-esophageal reflux disease without esophagitis: Secondary | ICD-10-CM | POA: Insufficient documentation

## 2013-02-28 DIAGNOSIS — Z87891 Personal history of nicotine dependence: Secondary | ICD-10-CM | POA: Insufficient documentation

## 2013-02-28 DIAGNOSIS — Z79899 Other long term (current) drug therapy: Secondary | ICD-10-CM | POA: Insufficient documentation

## 2013-02-28 LAB — CBC WITH DIFFERENTIAL/PLATELET
Eosinophils Relative: 4 % (ref 0–5)
HCT: 31 % — ABNORMAL LOW (ref 36.0–46.0)
Hemoglobin: 9.4 g/dL — ABNORMAL LOW (ref 12.0–15.0)
Lymphocytes Relative: 29 % (ref 12–46)
MCHC: 30.3 g/dL (ref 30.0–36.0)
MCV: 76.4 fL — ABNORMAL LOW (ref 78.0–100.0)
Monocytes Absolute: 0.5 10*3/uL (ref 0.1–1.0)
Monocytes Relative: 11 % (ref 3–12)
Neutro Abs: 2.5 10*3/uL (ref 1.7–7.7)
WBC: 4.5 10*3/uL (ref 4.0–10.5)

## 2013-02-28 LAB — COMPREHENSIVE METABOLIC PANEL
BUN: 12 mg/dL (ref 6–23)
CO2: 23 mEq/L (ref 19–32)
Calcium: 9.4 mg/dL (ref 8.4–10.5)
Chloride: 106 mEq/L (ref 96–112)
Creatinine, Ser: 1.2 mg/dL — ABNORMAL HIGH (ref 0.50–1.10)
GFR calc Af Amer: 51 mL/min — ABNORMAL LOW (ref >90)
GFR calc non Af Amer: 44 mL/min — ABNORMAL LOW (ref >90)
Total Bilirubin: 0.2 mg/dL — ABNORMAL LOW (ref 0.3–1.2)

## 2013-02-28 LAB — URINALYSIS, ROUTINE W REFLEX MICROSCOPIC
Leukocytes, UA: NEGATIVE
Nitrite: NEGATIVE
Protein, ur: NEGATIVE mg/dL
Urobilinogen, UA: 0.2 mg/dL (ref 0.0–1.0)

## 2013-02-28 MED ORDER — MECLIZINE HCL 25 MG PO TABS: 25.0000 mg | ORAL_TABLET | Freq: Three times a day (TID) | ORAL | Status: DC | PRN

## 2013-02-28 MED ORDER — GADOBENATE DIMEGLUMINE 529 MG/ML IV SOLN
5.0000 mL | Freq: Once | INTRAVENOUS | Status: AC | PRN
  Administered 2013-02-28: 5 mL via INTRAVENOUS

## 2013-02-28 NOTE — ED Notes (Signed)
Patient transported to MRI 

## 2013-02-28 NOTE — ED Notes (Signed)
Pt states she has been dizzy and weak since the holidays (November/December) Has been evaluated and had CT, labs, etc. "but they have not found anything". S/S worse for about a week.

## 2013-02-28 NOTE — ED Provider Notes (Signed)
History     CSN: [id number]  Arrival date & time 02/28/13  1308   First MD Initiated Contact with Patient 02/28/13 1325      Chief Complaint  Patient presents with  . Fatigue    (Consider location/radiation/quality/duration/timing/severity/associated sxs/prior treatment) HPI Comments: Patient presents with dizziness and weakness. She states this has been going on for the last 3 months. She states she originally saw her primary care physician and have labs and CT scan but they did not found.the etiology of her symptoms. She states she continues to have dizziness and generalized weakness. She describes the dizziness as feeling like she's going to passout. She denies any vertiginous-type symptoms. She now states that she feels like she's trunk and has a staggering gait. She states she's had this before but it seems to be worse today since she woke up. She also feels like her right arm is weaker than it has been in the past. She denies any numbness to her extremities. She denies any slurred speech. She denies any vision changes. She denies any cough or chest congestion. She denies any chest pain or shortness of breath. She denies any fevers or chills or other recent illnesses.   Past Medical History  Diagnosis Date  . Hypertension   . High cholesterol   . Still's disease   . GERD (gastroesophageal reflux disease)   . Anemia   . Thyroid disease   . H/O blood clots     pt states had blood clot in her rt arm    Past Surgical History  Procedure Laterality Date  . Joint replacement    . Abdominal hysterectomy    . Appendectomy      Family History  Problem Relation Age of Onset  . Rheum arthritis Mother   . Rheum arthritis Father     History  Substance Use Topics  . Smoking status: Former Smoker -- 1.00 packs/day for 26 years    Types: Cigarettes    Quit date: 12/10/1982  . Smokeless tobacco: Never Used  . Alcohol Use: No    OB History   Grav Para Term Preterm Abortions TAB  SAB Ect Mult Living                  Review of Systems  Constitutional: Positive for fatigue. Negative for fever, chills and diaphoresis.  HENT: Negative for congestion, rhinorrhea and sneezing.   Eyes: Negative.   Respiratory: Negative for cough, chest tightness and shortness of breath.   Cardiovascular: Negative for chest pain and leg swelling.  Gastrointestinal: Negative for nausea, vomiting, abdominal pain, diarrhea and blood in stool.  Genitourinary: Negative for frequency, hematuria, flank pain and difficulty urinating.  Musculoskeletal: Negative for back pain and arthralgias.  Skin: Negative for rash.  Neurological: Positive for dizziness, weakness and light-headedness. Negative for speech difficulty, numbness and headaches.    Allergies  Codeine  Home Medications   Current Outpatient Rx  Name  Route  Sig  Dispense  Refill  . calcium citrate (CALCITRATE - DOSED IN MG ELEMENTAL CALCIUM) 950 MG tablet   Oral   Take 1 tablet by mouth daily.         . clonazePAM (KLONOPIN) 1 MG tablet   Oral   Take 1 mg by mouth 2 (two) times daily as needed.         . famotidine (PEPCID) 20 MG tablet      One at bedtime   30 tablet   11   .  Folic Acid 20 MG CAPS   Oral   Take 20 mg by mouth daily.         . hydrocodone-acetaminophen (LORCET-HD) 5-500 MG per capsule   Oral   Take 1 capsule by mouth every 6 (six) hours as needed.         . iron polysaccharides (NIFEREX) 150 MG capsule   Oral   Take 150 mg by mouth 2 (two) times daily.         Marland Kitchen levothyroxine (SYNTHROID, LEVOTHROID) 50 MCG tablet   Oral   Take 50 mcg by mouth daily.         Marland Kitchen lovastatin (MEVACOR) 40 MG tablet   Oral   Take 40 mg by mouth at bedtime.         . meclizine (ANTIVERT) 25 MG tablet   Oral   Take 1 tablet (25 mg total) by mouth 3 (three) times daily as needed.   15 tablet   0   . metoprolol succinate (TOPROL-XL) 25 MG 24 hr tablet   Oral   Take 25 mg by mouth daily.          . mometasone-formoterol (DULERA) 100-5 MCG/ACT AERO      Take 2 puffs first thing in am and then another 2 puffs about 12 hours later.   1 Inhaler   3   . naproxen (NAPROSYN) 500 MG tablet   Oral   Take 500 mg by mouth daily as needed.         Marland Kitchen omeprazole (PRILOSEC) 40 MG capsule   Oral   Take 1 capsule (40 mg total) by mouth daily.   30 capsule   2   . PARoxetine (PAXIL) 40 MG tablet   Oral   Take 40 mg by mouth every morning.         Marland Kitchen perphenazine (TRILAFON) 2 MG tablet   Oral   Take 2 mg by mouth at bedtime.         Marland Kitchen zolpidem (AMBIEN) 10 MG tablet   Oral   Take 10 mg by mouth at bedtime as needed.           BP 135/73  Pulse 92  Temp(Src) 98.2 F (36.8 C) (Oral)  Resp 20  Ht 5' (1.524 m)  Wt 110 lb (49.896 kg)  BMI 21.48 kg/m2  SpO2 97%  Physical Exam  Constitutional: She is oriented to person, place, and time. She appears well-developed and well-nourished.  HENT:  Head: Normocephalic and atraumatic.  No nystagmus  Eyes: Pupils are equal, round, and reactive to light.  Neck: Normal range of motion. Neck supple.  Cardiovascular: Normal rate, regular rhythm and normal heart sounds.   Pulmonary/Chest: Effort normal and breath sounds normal. No respiratory distress. She has no wheezes. She has no rales. She exhibits no tenderness.  Abdominal: Soft. Bowel sounds are normal. There is no tenderness. There is no rebound and no guarding.  Musculoskeletal: Normal range of motion. She exhibits no edema.  Lymphadenopathy:    She has no cervical adenopathy.  Neurological: She is alert and oriented to person, place, and time. She has normal strength. No cranial nerve deficit or sensory deficit. GCS eye subscore is 4. GCS verbal subscore is 5. GCS motor subscore is 6.  Skin: Skin is warm and dry. No rash noted.  Psychiatric: She has a normal mood and affect.    ED Course  Procedures (including critical care time)  Results for orders placed during the  hospital encounter of 02/28/13  CBC WITH DIFFERENTIAL      Result Value Range   WBC 4.5  4.0 - 10.5 K/uL   RBC 4.06  3.87 - 5.11 MIL/uL   Hemoglobin 9.4 (*) 12.0 - 15.0 g/dL   HCT 16.1 (*) 09.6 - 04.5 %   MCV 76.4 (*) 78.0 - 100.0 fL   MCH 23.2 (*) 26.0 - 34.0 pg   MCHC 30.3  30.0 - 36.0 g/dL   RDW 40.9 (*) 81.1 - 91.4 %   Platelets 385  150 - 400 K/uL   Neutrophils Relative 54  43 - 77 %   Neutro Abs 2.5  1.7 - 7.7 K/uL   Lymphocytes Relative 29  12 - 46 %   Lymphs Abs 1.3  0.7 - 4.0 K/uL   Monocytes Relative 11  3 - 12 %   Monocytes Absolute 0.5  0.1 - 1.0 K/uL   Eosinophils Relative 4  0 - 5 %   Eosinophils Absolute 0.2  0.0 - 0.7 K/uL   Basophils Relative 1  0 - 1 %   Basophils Absolute 0.1  0.0 - 0.1 K/uL  COMPREHENSIVE METABOLIC PANEL      Result Value Range   Sodium 142  135 - 145 mEq/L   Potassium 4.4  3.5 - 5.1 mEq/L   Chloride 106  96 - 112 mEq/L   CO2 23  19 - 32 mEq/L   Glucose, Bld 96  70 - 99 mg/dL   BUN 12  6 - 23 mg/dL   Creatinine, Ser 7.82 (*) 0.50 - 1.10 mg/dL   Calcium 9.4  8.4 - 95.6 mg/dL   Total Protein 7.3  6.0 - 8.3 g/dL   Albumin 4.0  3.5 - 5.2 g/dL   AST 21  0 - 37 U/L   ALT 10  0 - 35 U/L   Alkaline Phosphatase 117  39 - 117 U/L   Total Bilirubin 0.2 (*) 0.3 - 1.2 mg/dL   GFR calc non Af Amer 44 (*) >90 mL/min   GFR calc Af Amer 51 (*) >90 mL/min  URINALYSIS, ROUTINE W REFLEX MICROSCOPIC      Result Value Range   Color, Urine YELLOW  YELLOW   APPearance CLEAR  CLEAR   Specific Gravity, Urine 1.022  1.005 - 1.030   pH 5.5  5.0 - 8.0   Glucose, UA NEGATIVE  NEGATIVE mg/dL   Hgb urine dipstick NEGATIVE  NEGATIVE   Bilirubin Urine NEGATIVE  NEGATIVE   Ketones, ur NEGATIVE  NEGATIVE mg/dL   Protein, ur NEGATIVE  NEGATIVE mg/dL   Urobilinogen, UA 0.2  0.0 - 1.0 mg/dL   Nitrite NEGATIVE  NEGATIVE   Leukocytes, UA NEGATIVE  NEGATIVE   No results found.    Date: 02/28/2013  Rate: 87  Rhythm: normal sinus rhythm  QRS Axis: normal   Intervals: normal  ST/T Wave abnormalities: normal  Conduction Disutrbances:none  Narrative Interpretation:   Old EKG Reviewed: unchanged   1. Dizziness       MDM  Pt with weakness/dizziness for 3 months associated with worsening staggering gait (although she has had this before).  Awaiting MRI.  Hgb low, but similar to prior values.  Creatinine mildly elevated over prior values.        Rolan Bucco, MD 03/01/13 [phone]

## 2013-05-16 DIAGNOSIS — E039 Hypothyroidism, unspecified: Secondary | ICD-10-CM | POA: Insufficient documentation

## 2013-05-16 DIAGNOSIS — E785 Hyperlipidemia, unspecified: Secondary | ICD-10-CM | POA: Insufficient documentation

## 2014-02-15 DIAGNOSIS — F411 Generalized anxiety disorder: Secondary | ICD-10-CM | POA: Insufficient documentation

## 2014-03-21 ENCOUNTER — Emergency Department (HOSPITAL_COMMUNITY): Payer: Medicare Other

## 2014-03-21 ENCOUNTER — Encounter (HOSPITAL_COMMUNITY): Payer: Self-pay | Admitting: Emergency Medicine

## 2014-03-21 ENCOUNTER — Emergency Department (HOSPITAL_COMMUNITY)
Admission: EM | Admit: 2014-03-21 | Discharge: 2014-03-21 | Disposition: A | Discharge status: Home / Self Care | Payer: Medicare Other | Attending: Emergency Medicine | Admitting: Emergency Medicine

## 2014-03-21 DIAGNOSIS — Y929 Unspecified place or not applicable: Secondary | ICD-10-CM | POA: Insufficient documentation

## 2014-03-21 DIAGNOSIS — Y9389 Activity, other specified: Secondary | ICD-10-CM | POA: Insufficient documentation

## 2014-03-21 DIAGNOSIS — I10 Essential (primary) hypertension: Secondary | ICD-10-CM | POA: Insufficient documentation

## 2014-03-21 DIAGNOSIS — W19XXXA Unspecified fall, initial encounter: Secondary | ICD-10-CM

## 2014-03-21 DIAGNOSIS — E7800 Pure hypercholesterolemia, unspecified: Secondary | ICD-10-CM | POA: Insufficient documentation

## 2014-03-21 DIAGNOSIS — Z86718 Personal history of other venous thrombosis and embolism: Secondary | ICD-10-CM | POA: Insufficient documentation

## 2014-03-21 DIAGNOSIS — Z79899 Other long term (current) drug therapy: Secondary | ICD-10-CM | POA: Insufficient documentation

## 2014-03-21 DIAGNOSIS — E079 Disorder of thyroid, unspecified: Secondary | ICD-10-CM | POA: Insufficient documentation

## 2014-03-21 DIAGNOSIS — W010XXA Fall on same level from slipping, tripping and stumbling without subsequent striking against object, initial encounter: Secondary | ICD-10-CM | POA: Insufficient documentation

## 2014-03-21 DIAGNOSIS — Z87891 Personal history of nicotine dependence: Secondary | ICD-10-CM | POA: Insufficient documentation

## 2014-03-21 DIAGNOSIS — Z9181 History of falling: Secondary | ICD-10-CM | POA: Insufficient documentation

## 2014-03-21 DIAGNOSIS — S0990XA Unspecified injury of head, initial encounter: Secondary | ICD-10-CM | POA: Insufficient documentation

## 2014-03-21 DIAGNOSIS — K219 Gastro-esophageal reflux disease without esophagitis: Secondary | ICD-10-CM | POA: Insufficient documentation

## 2014-03-21 DIAGNOSIS — Z7982 Long term (current) use of aspirin: Secondary | ICD-10-CM | POA: Insufficient documentation

## 2014-03-21 DIAGNOSIS — M083 Juvenile rheumatoid polyarthritis (seronegative): Secondary | ICD-10-CM | POA: Insufficient documentation

## 2014-03-21 HISTORY — DX: Unspecified fall, initial encounter: W19.XXXA

## 2014-03-21 LAB — URINALYSIS, ROUTINE W REFLEX MICROSCOPIC
BILIRUBIN URINE: NEGATIVE
Glucose, UA: NEGATIVE mg/dL
HGB URINE DIPSTICK: NEGATIVE
Ketones, ur: NEGATIVE mg/dL
Nitrite: NEGATIVE
PH: 6 (ref 5.0–8.0)
Protein, ur: NEGATIVE mg/dL
SPECIFIC GRAVITY, URINE: 1.024 (ref 1.005–1.030)
UROBILINOGEN UA: 0.2 mg/dL (ref 0.0–1.0)

## 2014-03-21 LAB — CBC WITH DIFFERENTIAL/PLATELET
Basophils Absolute: 0 10*3/uL (ref 0.0–0.1)
Basophils Relative: 0 % (ref 0–1)
EOS ABS: 0.2 10*3/uL (ref 0.0–0.7)
Eosinophils Relative: 2 % (ref 0–5)
HCT: 33.5 % — ABNORMAL LOW (ref 36.0–46.0)
HEMOGLOBIN: 10.6 g/dL — AB (ref 12.0–15.0)
LYMPHS ABS: 1.4 10*3/uL (ref 0.7–4.0)
LYMPHS PCT: 14 % (ref 12–46)
MCH: 30.5 pg (ref 26.0–34.0)
MCHC: 31.6 g/dL (ref 30.0–36.0)
MCV: 96.3 fL (ref 78.0–100.0)
MONOS PCT: 7 % (ref 3–12)
Monocytes Absolute: 0.7 10*3/uL (ref 0.1–1.0)
NEUTROS ABS: 8 10*3/uL — AB (ref 1.7–7.7)
NEUTROS PCT: 78 % — AB (ref 43–77)
PLATELETS: 540 10*3/uL — AB (ref 150–400)
RBC: 3.48 MIL/uL — AB (ref 3.87–5.11)
RDW: 14.7 % (ref 11.5–15.5)
WBC: 10.3 10*3/uL (ref 4.0–10.5)

## 2014-03-21 LAB — BASIC METABOLIC PANEL
BUN: 23 mg/dL (ref 6–23)
CHLORIDE: 104 meq/L (ref 96–112)
CO2: 22 meq/L (ref 19–32)
Calcium: 9.6 mg/dL (ref 8.4–10.5)
Creatinine, Ser: 0.89 mg/dL (ref 0.50–1.10)
GFR calc Af Amer: 73 mL/min — ABNORMAL LOW (ref >90)
GFR, EST NON AFRICAN AMERICAN: 63 mL/min — AB (ref >90)
GLUCOSE: 90 mg/dL (ref 70–99)
POTASSIUM: 4.4 meq/L (ref 3.7–5.3)
SODIUM: 142 meq/L (ref 137–147)

## 2014-03-21 LAB — I-STAT TROPONIN, ED: TROPONIN I, POC: 0.01 ng/mL (ref 0.00–0.08)

## 2014-03-21 LAB — URINE MICROSCOPIC-ADD ON

## 2014-03-21 MED ORDER — SODIUM CHLORIDE 0.9 % IV BOLUS (SEPSIS)
1000.0000 mL | Freq: Once | INTRAVENOUS | Status: AC
  Administered 2014-03-21: 1000 mL via INTRAVENOUS

## 2014-03-21 NOTE — Discharge Instructions (Signed)
Fall Prevention and Home Safety Falls cause injuries and can affect all age groups. It is possible to use preventive measures to significantly decrease the likelihood of falls. There are many simple measures which can make your home safer and prevent falls. OUTDOORS  Repair cracks and edges of walkways and driveways.  Remove high doorway thresholds.  Trim shrubbery on the main path into your home.  Have good outside lighting.  Clear walkways of tools, rocks, debris, and clutter.  Check that handrails are not broken and are securely fastened. Both sides of steps should have handrails.  Have leaves, snow, and ice cleared regularly.  Use sand or salt on walkways during winter months.  In the garage, clean up grease or oil spills. BATHROOM  Install night lights.  Install grab bars by the toilet and in the tub and shower.  Use non-skid mats or decals in the tub or shower.  Place a plastic non-slip stool in the shower to sit on, if needed.  Keep floors dry and clean up all water on the floor immediately.  Remove soap buildup in the tub or shower on a regular basis.  Secure bath mats with non-slip, double-sided rug tape.  Remove throw rugs and tripping hazards from the floors. BEDROOMS  Install night lights.  Make sure a bedside light is easy to reach.  Do not use oversized bedding.  Keep a telephone by your bedside.  Have a firm chair with side arms to use for getting dressed.  Remove throw rugs and tripping hazards from the floor. KITCHEN  Keep handles on pots and pans turned toward the center of the stove. Use back burners when possible.  Clean up spills quickly and allow time for drying.  Avoid walking on wet floors.  Avoid hot utensils and knives.  Position shelves so they are not too high or low.  Place commonly used objects within easy reach.  If necessary, use a sturdy step stool with a grab bar when reaching.  Keep electrical cables out of the  way.  Do not use floor polish or wax that makes floors slippery. If you must use wax, use non-skid floor wax.  Remove throw rugs and tripping hazards from the floor. STAIRWAYS  Never leave objects on stairs.  Place handrails on both sides of stairways and use them. Fix any loose handrails. Make sure handrails on both sides of the stairways are as long as the stairs.  Check carpeting to make sure it is firmly attached along stairs. Make repairs to worn or loose carpet promptly.  Avoid placing throw rugs at the top or bottom of stairways, or properly secure the rug with carpet tape to prevent slippage. Get rid of throw rugs, if possible.  Have an electrician put in a light switch at the top and bottom of the stairs. OTHER FALL PREVENTION TIPS  Wear low-heel or rubber-soled shoes that are supportive and fit well. Wear closed toe shoes.  When using a stepladder, make sure it is fully opened and both spreaders are firmly locked. Do not climb a closed stepladder.  Add color or contrast paint or tape to grab bars and handrails in your home. Place contrasting color strips on first and last steps.  Learn and use mobility aids as needed. Install an electrical emergency response system.  Turn on lights to avoid dark areas. Replace light bulbs that burn out immediately. Get light switches that glow.  Arrange furniture to create clear pathways. Keep furniture in the same place.  Firmly attach carpet with non-skid or double-sided tape. °· Eliminate uneven floor surfaces. °· Select a carpet pattern that does not visually hide the edge of steps. °· Be aware of all pets. °OTHER HOME SAFETY TIPS °· Set the water temperature for 120° F (48.8° C). °· Keep emergency numbers on or near the telephone. °· Keep smoke detectors on every level of the home and near sleeping areas. °Document Released: 11/16/2002 Document Revised: 05/27/2012 Document Reviewed: 02/15/2012 °[employer]® Patient Information ©2014  [employer]. ° °Head Injury, Adult °You have received a head injury. It does not appear serious at this time. Headaches and vomiting are common following head injury. It should be easy to awaken from sleeping. Sometimes it is necessary for you to stay in the emergency department for a while for observation. Sometimes admission to the hospital may be needed. After injuries such as yours, most problems occur within the first 24 hours, but side effects may occur up to 7 10 days after the injury. It is important for you to carefully monitor your condition and contact your health care provider or seek immediate medical care if there is a change in your condition. °WHAT ARE THE TYPES OF HEAD INJURIES? °Head injuries can be as minor as a bump. Some head injuries can be more severe. More severe head injuries include: °· A jarring injury to the brain (concussion). °· A bruise of the brain (contusion). This mean there is bleeding in the brain that can cause swelling. °· A cracked skull (skull fracture). °· Bleeding in the brain that collects, clots, and forms a bump (hematoma). °WHAT CAUSES A HEAD INJURY? °A serious head injury is most likely to happen to someone who is in a car wreck and is not wearing a seat belt. Other causes of major head injuries include bicycle or motorcycle accidents, sports injuries, and falls. °HOW ARE HEAD INJURIES DIAGNOSED? °A complete history of the event leading to the injury and your current symptoms will be helpful in diagnosing head injuries. Many times, pictures of the brain, such as CT or MRI are needed to see the extent of the injury. Often, an overnight hospital stay is necessary for observation.  °WHEN SHOULD I SEEK IMMEDIATE MEDICAL CARE?  °You should get help right away if: °· You have confusion or drowsiness. °· You feel sick to your stomach (nauseous) or have continued, forceful vomiting. °· You have dizziness or unsteadiness that is getting worse. °· You have severe, continued  headaches not relieved by medicine. Only take over-the-counter or prescription medicines for pain, fever, or discomfort as directed by your health care provider. °· You do not have normal function of the arms or legs or are unable to walk. °· You notice changes in the black spots in the center of the colored part of your eye (pupil). °· You have a clear or bloody fluid coming from your nose or ears. °· You have a loss of vision. °During the next 24 hours after the injury, you must stay with someone who can watch you for the warning signs. This person should contact local emergency services (911 in the [place] if you have seizures, you become unconscious, or you are unable to wake up. °HOW CAN I PREVENT A HEAD INJURY IN THE FUTURE? °The most important factor for preventing major head injuries is avoiding motor vehicle accidents.  To minimize the potential for damage to your head, it is crucial to wear seat belts while riding in motor vehicles. Wearing helmets while bike riding   and playing collision sports (like football) is also helpful. Also, avoiding dangerous activities around the house will further help reduce your risk of head injury.  WHEN CAN I RETURN TO NORMAL ACTIVITIES AND ATHLETICS? You should be reevaluated by your health care provider before returning to these activities. If you have any of the following symptoms, you should not return to activities or contact sports until 1 week after the symptoms have stopped:  Persistent headache.  Dizziness or vertigo.  Poor attention and concentration.  Confusion.  Memory problems.  Nausea or vomiting.  Fatigue or tire easily.  Irritability.  Intolerant of bright lights or loud noises.  Anxiety or depression.  Disturbed sleep. MAKE SURE YOU:   Understand these instructions.  Will watch your condition.  Will get help right away if you are not doing well or get worse. Document Released: 11/26/2005 Document Revised: 09/16/2013 Document  Reviewed: 08/03/2013 [facility] Patient Information 2014 [place].  Head Injury, Adult You have received a head injury. It does not appear serious at this time. Headaches and vomiting are common following head injury. It should be easy to awaken from sleeping. Sometimes it is necessary for you to stay in the emergency department for a while for observation. Sometimes admission to the hospital may be needed. After injuries such as yours, most problems occur within the first 24 hours, but side effects may occur up to 7 10 days after the injury. It is important for you to carefully monitor your condition and contact your health care provider or seek immediate medical care if there is a change in your condition. WHAT ARE THE TYPES OF HEAD INJURIES? Head injuries can be as minor as a bump. Some head injuries can be more severe. More severe head injuries include:  A jarring injury to the brain (concussion).  A bruise of the brain (contusion). This mean there is bleeding in the brain that can cause swelling.  A cracked skull (skull fracture).  Bleeding in the brain that collects, clots, and forms a bump (hematoma). WHAT CAUSES A HEAD INJURY? A serious head injury is most likely to happen to someone who is in a car wreck and is not wearing a seat belt. Other causes of major head injuries include bicycle or motorcycle accidents, sports injuries, and falls. HOW ARE HEAD INJURIES DIAGNOSED? A complete history of the event leading to the injury and your current symptoms will be helpful in diagnosing head injuries. Many times, pictures of the brain, such as CT or MRI are needed to see the extent of the injury. Often, an overnight hospital stay is necessary for observation.  WHEN SHOULD I SEEK IMMEDIATE MEDICAL CARE?  You should get help right away if:  You have confusion or drowsiness.  You feel sick to your stomach (nauseous) or have continued, forceful vomiting.  You have dizziness or  unsteadiness that is getting worse.  You have severe, continued headaches not relieved by medicine. Only take over-the-counter or prescription medicines for pain, fever, or discomfort as directed by your health care provider.  You do not have normal function of the arms or legs or are unable to walk.  You notice changes in the black spots in the center of the colored part of your eye (pupil).  You have a clear or bloody fluid coming from your nose or ears.  You have a loss of vision. During the next 24 hours after the injury, you must stay with someone who can watch you for the warning signs.  This person should contact local emergency services (911 in the [place] if you have seizures, you become unconscious, or you are unable to wake up. HOW CAN I PREVENT A HEAD INJURY IN THE FUTURE? The most important factor for preventing major head injuries is avoiding motor vehicle accidents. To minimize the potential for damage to your head, it is crucial to wear seat belts while riding in motor vehicles. Wearing helmets while bike riding and playing collision sports (like football) is also helpful. Also, avoiding dangerous activities around the house will further help reduce your risk of head injury.  WHEN CAN I RETURN TO NORMAL ACTIVITIES AND ATHLETICS? You should be reevaluated by your health care provider before returning to these activities. If you have any of the following symptoms, you should not return to activities or contact sports until 1 week after the symptoms have stopped:  Persistent headache.  Dizziness or vertigo.  Poor attention and concentration.  Confusion.  Memory problems.  Nausea or vomiting.  Fatigue or tire easily.  Irritability.  Intolerant of bright lights or loud noises.  Anxiety or depression.  Disturbed sleep. MAKE SURE YOU:   Understand these instructions.  Will watch your condition.  Will get help right away if you are not doing well or get  worse. Document Released: 11/26/2005 Document Revised: 09/16/2013 Document Reviewed: 08/03/2013 [facility] Patient Information 2014 [place].

## 2014-03-21 NOTE — ED Provider Notes (Signed)
TIME SEEN: 1:45 PM  CHIEF COMPLAINT: Frequent falls, head injury  HPI: Patient is a 73 y.o. F with history of hypertension, hyperlipidemia, hypothyroidism who presents emergency department with a fall and head injury today. Per patient's sister normal, patient normally ambulates with a walker. Over the past 6 months she has had increasing falls or she will lose her balance. They report over this week she has had 5 falls. Today she fell and hit her head on concrete. There was no loss of consciousness. She is not on anticoagulation. She's been seen by her PCP regarding his frequent falls and has had negative workups. She is currently wearing a Holter monitor. She denies any chest pain, shortness of breath, dizziness, palpitations that led to her fall. No recent fevers, cough, vomiting or diarrhea. No bloody stool or melena. No numbness, tingling or focal weakness.  ROS: See HPI Constitutional: no fever  Eyes: no drainage  ENT: no runny nose   Cardiovascular:  no chest pain  Resp: no SOB  GI: no vomiting GU: no dysuria Integumentary: no rash  Allergy: no hives  Musculoskeletal: no leg swelling  Neurological: no slurred speech ROS otherwise negative  PAST MEDICAL HISTORY/PAST SURGICAL HISTORY:  Past Medical History  Diagnosis Date  . Hypertension   . High cholesterol   . Still's disease   . GERD (gastroesophageal reflux disease)   . Anemia   . Thyroid disease   . H/O blood clots     pt states had blood clot in her rt arm  . Fall     MEDICATIONS:  Prior to Admission medications   Medication Sig Start Date End Date Taking? Authorizing Provider  calcium citrate (CALCITRATE - DOSED IN MG ELEMENTAL CALCIUM) 950 MG tablet Take 1 tablet by mouth daily.    Historical Provider, MD  clonazePAM (KLONOPIN) 1 MG tablet Take 1 mg by mouth 2 (two) times daily as needed.    Historical Provider, MD  famotidine (PEPCID) 20 MG tablet One at bedtime 12/25/12   Tanda Rockers, MD  Folic Acid 20 MG CAPS  Take 20 mg by mouth daily.    Historical Provider, MD  hydrocodone-acetaminophen (LORCET-HD) 5-500 MG per capsule Take 1 capsule by mouth every 6 (six) hours as needed.    Historical Provider, MD  iron polysaccharides (NIFEREX) 150 MG capsule Take 150 mg by mouth 2 (two) times daily.    Historical Provider, MD  levothyroxine (SYNTHROID, LEVOTHROID) 50 MCG tablet Take 50 mcg by mouth daily.    Historical Provider, MD  lovastatin (MEVACOR) 40 MG tablet Take 40 mg by mouth at bedtime.    Historical Provider, MD  meclizine (ANTIVERT) 25 MG tablet Take 1 tablet (25 mg total) by mouth 3 (three) times daily as needed. 02/28/13   Veryl Speak, MD  metoprolol succinate (TOPROL-XL) 25 MG 24 hr tablet Take 25 mg by mouth daily.    Historical Provider, MD  mometasone-formoterol (DULERA) 100-5 MCG/ACT AERO Take 2 puffs first thing in am and then another 2 puffs about 12 hours later. 12/25/12   Tanda Rockers, MD  naproxen (NAPROSYN) 500 MG tablet Take 500 mg by mouth daily as needed.    Historical Provider, MD  omeprazole (PRILOSEC) 40 MG capsule Take 1 capsule (40 mg total) by mouth daily. 12/25/12   Tanda Rockers, MD  PARoxetine (PAXIL) 40 MG tablet Take 40 mg by mouth every morning.    Historical Provider, MD  perphenazine (TRILAFON) 2 MG tablet Take 2 mg by  mouth at bedtime.    Historical Provider, MD  zolpidem (AMBIEN) 10 MG tablet Take 10 mg by mouth at bedtime as needed.    Historical Provider, MD    ALLERGIES:  Allergies  Allergen Reactions  . Codeine Palpitations    SOCIAL HISTORY:  History  Substance Use Topics  . Smoking status: Former Smoker -- 1.00 packs/day for 26 years    Types: Cigarettes    Quit date: 12/10/1982  . Smokeless tobacco: Never Used  . Alcohol Use: No    FAMILY HISTORY: Family History  Problem Relation Age of Onset  . Rheum arthritis Mother   . Rheum arthritis Father     EXAM: BP 147/76  Pulse 83  Temp(Src) 98.1 F (36.7 C) (Oral)  Resp 16  SpO2  94% CONSTITUTIONAL: Alert and oriented and responds appropriately to questions. Well-appearing; well-nourished; GCS 15 HEAD: Normocephalic; atraumatic EYES: Conjunctivae clear, PERRL, EOMI ENT: normal nose; no rhinorrhea; moist mucous membranes; pharynx without lesions noted; no dental injury; no septal hematoma NECK: Supple, no meningismus, no LAD; no midline spinal tenderness, step-off or deformity CARD: RRR; S1 and S2 appreciated; no murmurs, no clicks, no rubs, no gallops RESP: Normal chest excursion without splinting or tachypnea; breath sounds clear and equal bilaterally; no wheezes, no rhonchi, no rales; chest wall stable, nontender to palpation ABD/GI: Normal bowel sounds; non-distended; soft, non-tender, no rebound, no guarding PELVIS:  stable, nontender to palpation BACK:  The back appears normal and is non-tender to palpation, there is no CVA tenderness; no midline spinal tenderness, step-off or deformity EXT: Normal ROM in all joints; non-tender to palpation; no edema; normal capillary refill; no cyanosis    SKIN: Normal color for age and race; warm NEURO: Moves all extremities equally PSYCH: The patient's mood and manner are appropriate. Grooming and personal hygiene are appropriate.  MEDICAL DECISION MAKING: Patient here with frequent falls. We'll obtain CT imaging of her head, cervical spine. No other signs of trauma on exam. Will also obtain labs and urine to evaluate for any possible organic causes for her falls. We'll give IV fluids and attempt to ambulate.  ED PROGRESS: Patient's labs are unremarkable. Urine shows moderate leukocytes but no other sign of infection. Troponin is negative. EKG shows no new ischemic changes. Head and cervical spine imaging showed no acute changes. Patient is able to ambulate but is very unsteady. Discussed with patient's sister-in-law that we have no criteria for inpatient admission. Social work, Social research officer, government, has seen the patient and the case manager,  Elmo Putt, will see the patient as well. Patient is not able to meet criteria for a three-day inpatient stay and therefore placement would have to be paid for out of pocket. Will order home physical therapy and OT to eval and treat. Physical therapist, Cyndi, is at bedside for evaluation.  Sister in law states someone can stay with patient overnight and they will see in PCP in AM.       [place], DO 03/21/14 1654

## 2014-03-21 NOTE — Progress Notes (Signed)
   CARE MANAGEMENT NOTE 03/21/2014  Patient:  Rhonda Davenport, Rhonda Davenport   Account Number:  [id number]  Date Initiated:  03/21/2014  Documentation initiated by:  [facility]  Subjective/Objective Assessment:   falls     Action/Plan:   lives alone   Anticipated DC Date:  03/21/2014   Anticipated DC Plan:  [place]  CM consult      [facility] Choice  HOME HEALTH   Choice offered to / List presented to:  C-1 Patient   DME arranged  3-N-1  [place]      DME agency  [place] arranged  [facility].   Status of service:  Completed, signed off Medicare Important Message given?   (If response is "NO", the following Medicare IM given date fields will be blank) Date Medicare IM given:   Date Additional Medicare IM given:    Discharge Disposition:  [place]  Per UR Regulation:    If discussed at Long Length of Stay Meetings, dates discussed:    Comments:  03/21/2014 1745 NCM spoke to pt and offered choice for [facility]. Pt requested [facility] for HH. Notified [facility] for DME and HH. Spoke to pt and sister-in-law, Barnie Alderman # [phone], brother, Rennis Petty # [phone], niece Alison Murray # [phone]. Pt having multiple falls at home. States she does use her RW at home. Pt can stay with brother but she wants to stay home. Niece will stay tonight with pt and family will arrange assist/aide to come into assist pt. Jonnie Finner RN CCM Case Mgmt phone [phone]

## 2014-03-21 NOTE — ED Notes (Signed)
PT in with patient and ambulated patient in the hallway with her walker and a gait belt.

## 2014-03-21 NOTE — ED Notes (Signed)
Patient ambulated into the hallway with a walker. Patient walks with weight on bilateral heels, leaning backwards. Patient did not comprehend putting weight on toes and lean slightly forward.

## 2014-03-21 NOTE — ED Notes (Signed)
Patient transported to CT 

## 2014-03-21 NOTE — ED Notes (Signed)
Bed: FV43 Expected date: 03/21/14 Expected time: 1:30 PM Means of arrival: Ambulance Comments: fall

## 2014-03-21 NOTE — Evaluation (Signed)
Physical Therapy Evaluation Patient Details Name: Rhonda Davenport MRN: [id number] DOB: 06-19-1941 Today's Date: 03/21/2014   History of Present Illness  HPI: Patient is a 73 y.o. F with history of hypertension, hyperlipidemia, hypothyroidism who presents emergency department with a fall and head injury today.  Clinical Impression  Patient presents with multiple falls with injury.  Though she is unsafe to be left alone, she is unable to be placed for SNF rehab due to cannot afford to pay out of pocket and MD unable to admit.  Patient will benefit from HHPT/OT/aide and wheelchair to improve safety and independence with decreased falls.      Follow Up Recommendations Home health PT;Other (comment);Supervision/Assistance - 24 hour (HHOT, [place] aide)    Equipment Recommendations  Wheelchair (measurements PT) (16x16 with low floor to seat height (17"))    Recommendations for Other Services       Precautions / Restrictions Precautions Precautions: Fall Precaution Comments: has been falling at home about a year, has been able to get up or else uses her life alert system Required Braces or Orthoses: Other Brace/Splint Other Brace/Splint: left knee hinged brace      Mobility  Bed Mobility Overal bed mobility: Needs Assistance Bed Mobility: Supine to Sit     Supine to sit: Min guard;Supervision     General bed mobility comments: up from high stretcher in ED assist for safety (sister in law reports her bed at home really high, she recommends using bed in another room)  Transfers Overall transfer level: Needs assistance Equipment used: Rolling walker (2 wheeled) Transfers: Sit to/from Stand Sit to Stand: Min guard         General transfer comment: leans posteriorly initially with hips braced against bed, min assist to initiate anterior weight shift  Ambulation/Gait Ambulation/Gait assistance: Min assist Ambulation Distance (Feet): 90 Feet Assistive device: Rolling walker (2  wheeled) Gait Pattern/deviations: Step-through pattern;Decreased stride length;Shuffle;Leaning posteriorly   Gait velocity interpretation: <1.8 ft/sec, indicative of risk for recurrent falls General Gait Details: slow shuffling gait with initial steps with center of gravity posterior to base of support, improved with increased ambulation to get closer to neutral positioning; with turning needed assist for safety for walker management and for balance  Stairs            Wheelchair Mobility    Modified Rankin (Stroke Patients Only)       Balance Overall balance assessment: Needs assistance;History of Falls Sitting-balance support: Feet unsupported Sitting balance-Leahy Scale: Fair Sitting balance - Comments: able to balance no UE support sitting edge of stretcher, but leaning posteriorly Postural control: Posterior lean Standing balance support: Bilateral upper extremity supported Standing balance-Leahy Scale: Poor Standing balance comment: requires assist for balance with UE support initially                             Pertinent Vitals/Pain 9/10 left knee with ambulation    Home Living Family/patient expects to be discharged to:: Private residence Living Arrangements: Alone   Type of Home: House Home Access: Level entry     Home Layout: One level Home Equipment: Environmental consultant - 2 wheels;Cane - single point      Prior Function Level of Independence: Independent         Comments: sister-in-law with patient reports pt has fallen multiple times a week for a year; has had two broken arms, broken fibula and vertebrae; has frozen shoulders     Hand  Dominance        Extremity/Trunk Assessment               Lower Extremity Assessment: RLE deficits/detail;LLE deficits/detail RLE Deficits / Details: AROM WFL except ankle DF about -10 , strength grossly 4/5 LLE Deficits / Details: AROM WFL except ankle DF about -10 and knee flexion limited to about 95  with knee brace under pants , strength grossly 4/5; severe valgus deformity with ankle inversion  Cervical / Trunk Assessment: Kyphotic  Communication   Communication: No difficulties  Cognition Arousal/Alertness: Awake/alert Behavior During Therapy: WFL for tasks assessed/performed Overall Cognitive Status: Within Functional Limits for tasks assessed                      General Comments      Exercises Other Exercises Other Exercises: educated pt in need for rubber soled shoes with backs, for adequate lighting, for removing obstacles in path including rugs if able due to high fall risk; encouraged continued wearing of left knee brace      Assessment/Plan    PT Assessment All further PT needs can be met in the next venue of care  PT Diagnosis Abnormality of gait;Acute pain   PT Problem List Decreased balance;Decreased mobility;Decreased safety awareness;Decreased knowledge of use of DME;Pain  PT Treatment Interventions     PT Goals (Current goals can be found in the Care Plan section) Acute Rehab PT Goals PT Goal Formulation: No goals set, d/c therapy    Frequency     Barriers to discharge        Co-evaluation               End of Session Equipment Utilized During Treatment: Gait belt;Other (comment) (left hinged knee brace) Activity Tolerance: Patient limited by pain Patient left: in chair;with family/visitor present      Functional Assessment Tool Used: Clinical judgement Functional Limitation: Mobility: Walking and moving around Mobility: Walking and Moving Around Current Status ([id number]): At least 20 percent but less than 40 percent impaired, limited or restricted Mobility: Walking and Moving Around Goal Status [phone]): At least 20 percent but less than 40 percent impaired, limited or restricted Mobility: Walking and Moving Around Discharge Status [phone]): At least 20 percent but less than 40 percent impaired, limited or restricted    Time:  1640-1720 PT Time Calculation (min): 40 min   Charges:   PT Evaluation $Initial PT Evaluation Tier I: 1 Procedure PT Treatments $Gait Training: 8-22 mins $Self Care/Home Management: 8-22   PT G Codes:   Functional Assessment Tool Used: Clinical judgement Functional Limitation: Mobility: Walking and moving around    Max Sane 03/21/2014, 5:38 PM Magda Kiel, [place] 03/21/2014

## 2014-03-21 NOTE — ED Notes (Signed)
Per EMS- patient has fallen 5 times in a week. Today, patient states she turned loose of her walker and fell backwards, hitting her head on a tile floor. No hematoma or laceration./ Fall was witnessed. Patient has seen physicians this week regarding falls. Patient presently has a cardiac monitor on.

## 2014-03-21 NOTE — ED Notes (Signed)
Patient ambulated to the bathroom with 2 person assist. patient had "wobbly" knees and a shuffling gait.

## 2014-03-24 ENCOUNTER — Emergency Department (HOSPITAL_COMMUNITY): Payer: Medicare Other

## 2014-03-24 ENCOUNTER — Inpatient Hospital Stay (HOSPITAL_COMMUNITY)
Admission: EM | Admit: 2014-03-24 | Discharge: 2014-03-29 | DRG: 640 | Disposition: A | Discharge status: Skilled Nursing Facility | Payer: Medicare Other | Attending: Internal Medicine | Admitting: Internal Medicine

## 2014-03-24 ENCOUNTER — Encounter (HOSPITAL_COMMUNITY): Payer: Self-pay | Admitting: Emergency Medicine

## 2014-03-24 ENCOUNTER — Other Ambulatory Visit: Payer: Self-pay

## 2014-03-24 DIAGNOSIS — K219 Gastro-esophageal reflux disease without esophagitis: Secondary | ICD-10-CM | POA: Diagnosis present

## 2014-03-24 DIAGNOSIS — R4182 Altered mental status, unspecified: Secondary | ICD-10-CM | POA: Diagnosis present

## 2014-03-24 DIAGNOSIS — Z79899 Other long term (current) drug therapy: Secondary | ICD-10-CM

## 2014-03-24 DIAGNOSIS — R296 Repeated falls: Secondary | ICD-10-CM

## 2014-03-24 DIAGNOSIS — F05 Delirium due to known physiological condition: Secondary | ICD-10-CM

## 2014-03-24 DIAGNOSIS — E860 Dehydration: Principal | ICD-10-CM | POA: Diagnosis present

## 2014-03-24 DIAGNOSIS — Z9181 History of falling: Secondary | ICD-10-CM

## 2014-03-24 DIAGNOSIS — G20A1 Parkinson's disease without dyskinesia, without mention of fluctuations: Secondary | ICD-10-CM | POA: Diagnosis present

## 2014-03-24 DIAGNOSIS — R410 Disorientation, unspecified: Secondary | ICD-10-CM

## 2014-03-24 DIAGNOSIS — G20 Parkinson's disease: Secondary | ICD-10-CM | POA: Diagnosis present

## 2014-03-24 DIAGNOSIS — E039 Hypothyroidism, unspecified: Secondary | ICD-10-CM | POA: Diagnosis present

## 2014-03-24 DIAGNOSIS — G9340 Encephalopathy, unspecified: Secondary | ICD-10-CM | POA: Diagnosis present

## 2014-03-24 DIAGNOSIS — I10 Essential (primary) hypertension: Secondary | ICD-10-CM | POA: Diagnosis present

## 2014-03-24 DIAGNOSIS — F0390 Unspecified dementia without behavioral disturbance: Secondary | ICD-10-CM | POA: Diagnosis present

## 2014-03-24 DIAGNOSIS — F329 Major depressive disorder, single episode, unspecified: Secondary | ICD-10-CM | POA: Diagnosis present

## 2014-03-24 DIAGNOSIS — Z87891 Personal history of nicotine dependence: Secondary | ICD-10-CM

## 2014-03-24 DIAGNOSIS — N390 Urinary tract infection, site not specified: Secondary | ICD-10-CM | POA: Diagnosis present

## 2014-03-24 DIAGNOSIS — M083 Juvenile rheumatoid polyarthritis (seronegative): Secondary | ICD-10-CM | POA: Diagnosis present

## 2014-03-24 DIAGNOSIS — F3289 Other specified depressive episodes: Secondary | ICD-10-CM | POA: Diagnosis present

## 2014-03-24 DIAGNOSIS — E079 Disorder of thyroid, unspecified: Secondary | ICD-10-CM | POA: Diagnosis present

## 2014-03-24 DIAGNOSIS — F29 Unspecified psychosis not due to a substance or known physiological condition: Secondary | ICD-10-CM

## 2014-03-24 DIAGNOSIS — E7800 Pure hypercholesterolemia, unspecified: Secondary | ICD-10-CM | POA: Diagnosis present

## 2014-03-24 DIAGNOSIS — R05 Cough: Secondary | ICD-10-CM

## 2014-03-24 DIAGNOSIS — R569 Unspecified convulsions: Secondary | ICD-10-CM | POA: Diagnosis present

## 2014-03-24 DIAGNOSIS — R059 Cough, unspecified: Secondary | ICD-10-CM

## 2014-03-24 LAB — COMPREHENSIVE METABOLIC PANEL
ALT: 14 U/L (ref 0–35)
AST: 18 U/L (ref 0–37)
Albumin: 3.7 g/dL (ref 3.5–5.2)
Alkaline Phosphatase: 88 U/L (ref 39–117)
BUN: 20 mg/dL (ref 6–23)
CALCIUM: 9.1 mg/dL (ref 8.4–10.5)
CO2: 22 meq/L (ref 19–32)
CREATININE: 0.8 mg/dL (ref 0.50–1.10)
Chloride: 108 mEq/L (ref 96–112)
GFR, EST AFRICAN AMERICAN: 83 mL/min — AB (ref >90)
GFR, EST NON AFRICAN AMERICAN: 71 mL/min — AB (ref >90)
GLUCOSE: 89 mg/dL (ref 70–99)
Potassium: 3.8 mEq/L (ref 3.7–5.3)
Sodium: 145 mEq/L (ref 137–147)
TOTAL PROTEIN: 6.9 g/dL (ref 6.0–8.3)
Total Bilirubin: 0.3 mg/dL (ref 0.3–1.2)

## 2014-03-24 LAB — CBC WITH DIFFERENTIAL/PLATELET
Basophils Absolute: 0 10*3/uL (ref 0.0–0.1)
Basophils Relative: 0 % (ref 0–1)
EOS PCT: 2 % (ref 0–5)
Eosinophils Absolute: 0.1 10*3/uL (ref 0.0–0.7)
HEMATOCRIT: 32 % — AB (ref 36.0–46.0)
HEMOGLOBIN: 10.1 g/dL — AB (ref 12.0–15.0)
LYMPHS ABS: 0.7 10*3/uL (ref 0.7–4.0)
LYMPHS PCT: 11 % — AB (ref 12–46)
MCH: 30.1 pg (ref 26.0–34.0)
MCHC: 31.6 g/dL (ref 30.0–36.0)
MCV: 95.5 fL (ref 78.0–100.0)
MONOS PCT: 10 % (ref 3–12)
Monocytes Absolute: 0.7 10*3/uL (ref 0.1–1.0)
Neutro Abs: 5.2 10*3/uL (ref 1.7–7.7)
Neutrophils Relative %: 78 % — ABNORMAL HIGH (ref 43–77)
PLATELETS: 410 10*3/uL — AB (ref 150–400)
RBC: 3.35 MIL/uL — AB (ref 3.87–5.11)
RDW: 15.1 % (ref 11.5–15.5)
WBC: 6.7 10*3/uL (ref 4.0–10.5)

## 2014-03-24 LAB — TROPONIN I: Troponin I: <0.3 ng/mL (ref <0.30)

## 2014-03-24 LAB — TSH: TSH: 1.97 u[IU]/mL (ref 0.350–4.500)

## 2014-03-24 LAB — URINALYSIS, ROUTINE W REFLEX MICROSCOPIC
Bilirubin Urine: NEGATIVE
Glucose, UA: NEGATIVE mg/dL
Hgb urine dipstick: NEGATIVE
Ketones, ur: NEGATIVE mg/dL
Leukocytes, UA: NEGATIVE
NITRITE: NEGATIVE
PROTEIN: NEGATIVE mg/dL
Specific Gravity, Urine: 1.019 (ref 1.005–1.030)
UROBILINOGEN UA: 1 mg/dL (ref 0.0–1.0)
pH: 6.5 (ref 5.0–8.0)

## 2014-03-24 LAB — PHOSPHORUS: Phosphorus: 2.9 mg/dL (ref 2.3–4.6)

## 2014-03-24 LAB — AMMONIA: Ammonia: 65 umol/L — ABNORMAL HIGH (ref 11–60)

## 2014-03-24 LAB — CBG MONITORING, ED: GLUCOSE-CAPILLARY: 114 mg/dL — AB (ref 70–99)

## 2014-03-24 LAB — MRSA PCR SCREENING: MRSA BY PCR: NEGATIVE

## 2014-03-24 LAB — I-STAT TROPONIN, ED: TROPONIN I, POC: 0.03 ng/mL (ref 0.00–0.08)

## 2014-03-24 LAB — MAGNESIUM: MAGNESIUM: 2.1 mg/dL (ref 1.5–2.5)

## 2014-03-24 LAB — I-STAT CG4 LACTIC ACID, ED: Lactic Acid, Venous: 3.27 mmol/L — ABNORMAL HIGH (ref 0.5–2.2)

## 2014-03-24 MED ORDER — ONDANSETRON HCL 4 MG PO TABS: 4.0000 mg | ORAL_TABLET | Freq: Four times a day (QID) | ORAL | Status: DC | PRN

## 2014-03-24 MED ORDER — ONDANSETRON HCL 4 MG/2ML IJ SOLN: 4.0000 mg | Freq: Four times a day (QID) | INTRAMUSCULAR | Status: DC | PRN

## 2014-03-24 MED ORDER — SODIUM CHLORIDE 0.9 % IV SOLN: INTRAVENOUS | Status: DC

## 2014-03-24 MED ORDER — ENOXAPARIN SODIUM 40 MG/0.4ML ~~LOC~~ SOLN
40.0000 mg | SUBCUTANEOUS | Status: DC
  Administered 2014-03-24 – 2014-03-28 (×4): 40 mg via SUBCUTANEOUS
  Filled 2014-03-24 (×6): qty 0.4

## 2014-03-24 MED ORDER — LEVOTHYROXINE SODIUM 50 MCG PO TABS
50.0000 ug | ORAL_TABLET | Freq: Every day | ORAL | Status: DC
  Administered 2014-03-25 – 2014-03-29 (×5): 50 ug via ORAL
  Filled 2014-03-24 (×6): qty 1

## 2014-03-24 MED ORDER — METHOTREXATE 2.5 MG PO TABS: 15.0000 mg | ORAL_TABLET | ORAL | Status: DC

## 2014-03-24 MED ORDER — SODIUM CHLORIDE 0.9 % IV SOLN
INTRAVENOUS | Status: DC
  Administered 2014-03-24: 75 mL/h via INTRAVENOUS
  Administered 2014-03-25 – 2014-03-26 (×2): via INTRAVENOUS
  Administered 2014-03-27: 75 mL via INTRAVENOUS
  Administered 2014-03-27 – 2014-03-29 (×3): via INTRAVENOUS

## 2014-03-24 MED ORDER — BUPROPION HCL ER (XL) 150 MG PO TB24
150.0000 mg | ORAL_TABLET | Freq: Every day | ORAL | Status: DC
  Filled 2014-03-24 (×2): qty 1

## 2014-03-24 MED ORDER — PANTOPRAZOLE SODIUM 40 MG PO TBEC
40.0000 mg | DELAYED_RELEASE_TABLET | Freq: Every day | ORAL | Status: DC
  Administered 2014-03-25 – 2014-03-29 (×5): 40 mg via ORAL
  Filled 2014-03-24 (×5): qty 1

## 2014-03-24 MED ORDER — SERTRALINE HCL 100 MG PO TABS
100.0000 mg | ORAL_TABLET | Freq: Every day | ORAL | Status: DC
  Administered 2014-03-24 – 2014-03-28 (×5): 100 mg via ORAL
  Filled 2014-03-24 (×6): qty 1

## 2014-03-24 MED ORDER — SODIUM CHLORIDE 0.9 % IV SOLN
1000.0000 mg | Freq: Once | INTRAVENOUS | Status: DC
  Filled 2014-03-24: qty 10

## 2014-03-24 MED ORDER — HYDRALAZINE HCL 20 MG/ML IJ SOLN
10.0000 mg | Freq: Once | INTRAMUSCULAR | Status: AC
  Administered 2014-03-24: 10 mg via INTRAVENOUS
  Filled 2014-03-24: qty 1

## 2014-03-24 MED ORDER — ASPIRIN 81 MG PO TABS: 81.0000 mg | ORAL_TABLET | Freq: Every day | ORAL | Status: DC

## 2014-03-24 MED ORDER — CEFTRIAXONE SODIUM 1 G IJ SOLR
1.0000 g | INTRAMUSCULAR | Status: DC
  Administered 2014-03-24 – 2014-03-28 (×5): 1 g via INTRAVENOUS
  Filled 2014-03-24 (×7): qty 10

## 2014-03-24 MED ORDER — ASPIRIN 81 MG PO CHEW
81.0000 mg | CHEWABLE_TABLET | Freq: Every day | ORAL | Status: DC
  Administered 2014-03-24 – 2014-03-28 (×5): 81 mg via ORAL
  Filled 2014-03-24 (×6): qty 1

## 2014-03-24 MED ORDER — AMANTADINE HCL 100 MG PO CAPS
100.0000 mg | ORAL_CAPSULE | Freq: Two times a day (BID) | ORAL | Status: DC
  Administered 2014-03-24 – 2014-03-26 (×5): 100 mg via ORAL
  Filled 2014-03-24 (×7): qty 1

## 2014-03-24 MED ORDER — METOPROLOL TARTRATE 1 MG/ML IV SOLN
5.0000 mg | Freq: Two times a day (BID) | INTRAVENOUS | Status: DC
  Administered 2014-03-25 (×2): 5 mg via INTRAVENOUS
  Filled 2014-03-24 (×3): qty 5

## 2014-03-24 MED ORDER — ONDANSETRON HCL 4 MG/2ML IJ SOLN: 4.0000 mg | Freq: Three times a day (TID) | INTRAMUSCULAR | Status: DC | PRN

## 2014-03-24 MED ORDER — SODIUM CHLORIDE 0.9 % IV BOLUS (SEPSIS)
500.0000 mL | Freq: Once | INTRAVENOUS | Status: AC
  Administered 2014-03-24: 500 mL via INTRAVENOUS

## 2014-03-24 MED ORDER — LORAZEPAM 2 MG/ML IJ SOLN
0.5000 mg | Freq: Once | INTRAMUSCULAR | Status: AC
  Administered 2014-03-24: 0.5 mg via INTRAVENOUS
  Filled 2014-03-24: qty 1

## 2014-03-24 MED ORDER — PERPHENAZINE 2 MG PO TABS
2.0000 mg | ORAL_TABLET | Freq: Every day | ORAL | Status: DC
  Administered 2014-03-24 – 2014-03-26 (×3): 2 mg via ORAL
  Filled 2014-03-24 (×4): qty 1

## 2014-03-24 MED ORDER — LORAZEPAM 2 MG/ML IJ SOLN: 1.0000 mg | INTRAMUSCULAR | Status: DC | PRN

## 2014-03-24 MED ORDER — FLUDROCORTISONE ACETATE 0.1 MG PO TABS
0.1000 mg | ORAL_TABLET | Freq: Every day | ORAL | Status: DC
  Administered 2014-03-25 – 2014-03-29 (×5): 0.1 mg via ORAL
  Filled 2014-03-24 (×6): qty 1

## 2014-03-24 NOTE — ED Provider Notes (Signed)
CSN: [id number]     Arrival date & time 03/24/14  1154 History   First MD Initiated Contact with Patient 03/24/14 1239     Chief Complaint  Patient presents with  . Weakness  . 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  Pt has had worsening general weakness and more frequent falls.  Lightheaded and general weakness causing falls.  Pt has monitor in place for cardiac monitoring.  ASA use.  Pt hit head and her entire body hurts.  Mild tremor worse RLE recently.  Pt has had work ups for frequent falls, no known cause.  Mild confusion recently.  Constant sxs.  Recently stopped bp med and placed on florinef to see if that would prevent falls/ lightheaded.   Patient is a 73 y.o. female presenting with weakness and fall. The history is provided by the patient and a relative.  Weakness This is a recurrent problem. Pertinent negatives include no chest pain, no abdominal pain, no headaches and no shortness of breath.  Fall Pertinent negatives include no chest pain, no abdominal pain, no headaches and no shortness of breath.    Past Medical History  Diagnosis Date  . Hypertension   . High cholesterol   . Still's disease   . GERD (gastroesophageal reflux disease)   . Anemia   . Thyroid disease   . H/O blood clots     pt states had blood clot in her rt arm  . Fall    Past Surgical History  Procedure Laterality Date  . Joint replacement    . Abdominal hysterectomy    . Appendectomy    . Shoulder surgery     Family History  Problem Relation Age of Onset  . Rheum arthritis Mother   . Rheum arthritis Father    History  Substance Use Topics  . Smoking status: Former Smoker -- 1.00 packs/day for 26 years    Types: Cigarettes    Quit date: 12/10/1982  . Smokeless tobacco: Never Used  .  Alcohol Use: No   OB History   Grav Para Term Preterm Abortions TAB SAB Ect Mult Living                 Review of Systems  Constitutional: Negative for fever and chills.  HENT: Negative for congestion.   Eyes: Positive for visual disturbance (blurry).  Respiratory: Negative for shortness of breath.   Cardiovascular: Negative for chest pain.  Gastrointestinal: Negative for vomiting and abdominal pain.  Genitourinary: Negative for dysuria and flank pain.  Musculoskeletal: Positive for arthralgias and back pain (lower). Negative for neck pain and neck stiffness.  Skin: Negative for rash.  Neurological: Positive for tremors, syncope, weakness and light-headedness. Negative for headaches.      Allergies  Codeine  Home Medications   Prior to Admission medications   Medication Sig Start Date End Date Taking? Authorizing Provider  amantadine (SYMMETREL) 100 MG capsule Take 100 mg by mouth 2 (two) times daily.   Yes Historical Provider, MD  aspirin 81 MG tablet Take 81 mg by mouth at bedtime.   Yes Historical Provider, MD  buPROPion (WELLBUTRIN XL) 150 MG 24 hr tablet Take 150 mg by mouth daily after breakfast.   Yes Historical Provider, MD  clonazePAM (KLONOPIN) 1 MG tablet Take 1 mg by mouth at bedtime.    Yes Historical Provider,  MD  ergocalciferol (VITAMIN D2) 50000 UNITS capsule Take 50,000 Units by mouth once a week. Wednesdays. For 12 weeks, ends on 04/21/14.   Yes Historical Provider, MD  fludrocortisone (FLORINEF) 0.1 MG tablet Take 0.1 mg by mouth daily after breakfast.   Yes Historical Provider, MD  HYDROcodone-acetaminophen (NORCO/VICODIN) 5-325 MG per tablet Take 1-2 tablets by mouth every 4 (four) hours as needed for moderate pain.   Yes Historical Provider, MD  levothyroxine (SYNTHROID, LEVOTHROID) 50 MCG tablet Take 50 mcg by mouth daily before breakfast.    Yes Historical Provider, MD  meclizine (ANTIVERT) 12.5 MG tablet Take 12.5 mg by mouth 2 (two) times daily as needed  for dizziness. Twice a day for 1 week(reduced from TID starting on 4/13), then once a day for a week, then discontinue.   Yes Historical Provider, MD  methotrexate (RHEUMATREX) 2.5 MG tablet Take 15 mg by mouth once a week. Caution:Chemotherapy. Protect from light. Takes on Wednesday's   Yes Historical Provider, MD  omeprazole (PRILOSEC) 40 MG capsule Take 1 capsule (40 mg total) by mouth daily. 12/25/12  Yes Tanda Rockers, MD  perphenazine (TRILAFON) 2 MG tablet Take 2 mg by mouth at bedtime.   Yes Historical Provider, MD  polyvinyl alcohol (LIQUIFILM TEARS) 1.4 % ophthalmic solution Place 1 drop into both eyes 3 (three) times daily as needed for dry eyes.   Yes Historical Provider, MD  sertraline (ZOLOFT) 100 MG tablet Take 100 mg by mouth at bedtime.   Yes Historical Provider, MD  zolpidem (AMBIEN) 5 MG tablet Take 5 mg by mouth at bedtime as needed for sleep.   Yes Historical Provider, MD   BP 134/83  Pulse 95  Temp(Src) 98.3 F (36.8 C) (Oral)  Resp 16  SpO2 94% Physical Exam  Nursing note and vitals reviewed. Constitutional: She appears well-nourished. No distress.  HENT:  Head: Normocephalic and atraumatic.  Mild gen weakness Dry mm  Eyes: Conjunctivae are normal. Right eye exhibits no discharge. Left eye exhibits no discharge.  Neck: Normal range of motion. Neck supple. No tracheal deviation present.  Cardiovascular: Normal rate and regular rhythm.   Pulmonary/Chest: Effort normal and breath sounds normal.  Abdominal: Soft. She exhibits no distension. There is no tenderness. There is no guarding.  Musculoskeletal: She exhibits no edema.  Neurological: She is alert. No sensory deficit. GCS eye subscore is 4. GCS verbal subscore is 4. GCS motor subscore is 6.  Alert, mild gen confusion General weakness, moves all ext equal bilateral however decr due to shoulder pain which is chronic EOMFI, perrl Finger nose showed dysmetria  Skin: Skin is warm. No rash noted. She is not  diaphoretic.  Psychiatric: She has a normal mood and affect.    ED Course  Procedures (including critical care time) Labs Review Labs Reviewed  CBC WITH DIFFERENTIAL - Abnormal; Notable for the following:    RBC 3.35 (*)    Hemoglobin 10.1 (*)    HCT 32.0 (*)    Platelets 410 (*)    Neutrophils Relative % 78 (*)    Lymphocytes Relative 11 (*)    All other components within normal limits  COMPREHENSIVE METABOLIC PANEL - Abnormal; Notable for the following:    GFR calc non Af Amer 71 (*)    GFR calc Af Amer 83 (*)    All other components within normal limits  AMMONIA - Abnormal; Notable for the following:    Ammonia 65 (*)    All other components within normal  limits  CBG MONITORING, ED - Abnormal; Notable for the following:    Glucose-Capillary 114 (*)    All other components within normal limits  I-STAT CG4 LACTIC ACID, ED - Abnormal; Notable for the following:    Lactic Acid, Venous 3.27 (*)    All other components within normal limits  URINE CULTURE  MRSA PCR SCREENING  URINALYSIS, ROUTINE W REFLEX MICROSCOPIC  MAGNESIUM  PHOSPHORUS  TROPONIN I  TSH  TROPONIN I  TROPONIN I  URINALYSIS, ROUTINE W REFLEX MICROSCOPIC  I-STAT TROPOININ, ED    Imaging Review Dg Hip Complete Left  03/24/2014   CLINICAL DATA:  Weakness, fall and left hip pain.  EXAM: LEFT HIP - COMPLETE 2+ VIEW  COMPARISON:  None.  FINDINGS: No acute fracture or dislocation is identified. Mild degenerative changes are seen involving the hip joint. The bony pelvis appears unremarkable. Soft tissues are within normal limits. No bony lesions are seen.  IMPRESSION: No acute fracture identified.   Electronically Signed   By: Aletta Edouard M.D.   On: 03/24/2014 14:49   Dg Tibia/fibula Right  03/24/2014   CLINICAL DATA:  Weakness/fall  EXAM: RIGHT TIBIA AND FIBULA - 2 VIEW  COMPARISON:  None.  FINDINGS: No fracture or dislocation is seen.  Mild degenerative changes of the knee.  The visualized soft tissues are  unremarkable.  IMPRESSION: No fracture or dislocation is seen.   Electronically Signed   By: Julian Hy M.D.   On: 03/24/2014 15:06   Ct Head Wo Contrast  03/24/2014   CLINICAL DATA:  Patient was seen in treated for weakness 3 days ago, patient fell last night and was found on floor, has developed tremors and complaining of dizziness  EXAM: CT HEAD WITHOUT CONTRAST  CT CERVICAL SPINE WITHOUT CONTRAST  TECHNIQUE: Multidetector CT imaging of the head and cervical spine was performed following the standard protocol without intravenous contrast. Multiplanar CT image reconstructions of the cervical spine were also generated.  COMPARISON:  CT HEAD W/O CM dated 03/21/2014; CT C SPINE W/O CM dated 03/21/2014  FINDINGS: CT HEAD FINDINGS  No skull fracture. Moderate inflammatory change left maxillary sinus. Moderate diffuse atrophy and low attenuation in the deep white matter. No evidence of hemorrhage, extra-axial fluid, or vascular territory infarct.  CT CERVICAL SPINE FINDINGS  No fracture. No prevertebral soft tissue swelling. Sharply reversed lordosis in the central cervical spine with grade 1 borderline grade 2 anterior listhesis of C4 on C5. Severe multilevel degenerative disc disease. C5 and C6 are fused.  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  Chronic intracranial involutional change. Mild to moderate inflammatory change in the maxillary sinus on the left   Electronically Signed   By: Skipper Cliche M.D.   On: 03/24/2014 15:45   Ct Cervical Spine Wo Contrast  03/24/2014   CLINICAL DATA:  Patient was seen in treated for weakness 3 days ago, patient fell last night and was found on floor, has developed tremors and complaining of dizziness  EXAM: CT HEAD WITHOUT CONTRAST  CT CERVICAL SPINE WITHOUT CONTRAST  TECHNIQUE: Multidetector CT imaging of the head and cervical spine was performed following the standard protocol without intravenous contrast.  Multiplanar CT image reconstructions of the cervical spine were also generated.  COMPARISON:  CT HEAD W/O CM dated 03/21/2014; CT C SPINE W/O CM dated 03/21/2014  FINDINGS: CT HEAD FINDINGS  No skull fracture. Moderate inflammatory change left maxillary sinus. Moderate diffuse atrophy and low attenuation  in the deep white matter. No evidence of hemorrhage, extra-axial fluid, or vascular territory infarct.  CT CERVICAL SPINE FINDINGS  No fracture. No prevertebral soft tissue swelling. Sharply reversed lordosis in the central cervical spine with grade 1 borderline grade 2 anterior listhesis of C4 on C5. Severe multilevel degenerative disc disease. C5 and C6 are fused.  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  Chronic intracranial involutional change. Mild to moderate inflammatory change in the maxillary sinus on the left   Electronically Signed   By: Skipper Cliche M.D.   On: 03/24/2014 15:45     EKG Interpretation None      Date: 03/24/2014  Rate: 106  Rhythm: sinus tachycardia  QRS Axis: indeterminate  Intervals: QT prolonged  ST/T Wave abnormalities: nonspecific ST changes  Conduction Disutrbances:none   MDM   Final diagnoses:  Seizure  Altered mental state  Frequent falls    Pt had witnessed seizure in ED lasting 1 min. POC glu, ekg, CT head.  No neck stiffness or fevers.  Unknown cause of seizures at this time. Neuro paged.  Discussed with neuro, rec keppra and they will evaluate. CT no acute findings.    Any x-rays performed were personally reviewed by myself.   Differential diagnosis were considered with the presenting HPI.  EKG: reviewed  Filed Vitals:   03/24/14 1203 03/24/14 1207  BP: 134/83   Pulse: 95   Temp: 98.3 F (36.8 C)   TempSrc: Oral   Resp: 16   SpO2: 95% 94%    Admission/ observation were discussed with the admitting physician, patient and/or family and they are comfortable with the plan.     Mariea Clonts, MD 03/24/14 [phone]

## 2014-03-24 NOTE — ED Notes (Signed)
Bed: WA08 Expected date:  Expected time:  Means of arrival:  Comments: EMS- AMS, lethargic

## 2014-03-24 NOTE — H&P (Signed)
[facility] History and Physical  Rhonda Davenport NFA:[id number] DOB: January 09, 1941 DOA: 03/24/2014  Referring physician: ER physician PCP: Rhonda Davenport., MD   Chief Complaint: altered mental status  HPI:  73 year old pleasant female with multiple medical co-morbidities including but not limited to rheumatoid arthritis, anxiety and depression, hypertension, GERD who presented to [facility] ED 03/24/2014 with ongoing weakness and although inconsistent but nevertheless present on and off mental stats changes in past few months prior to this admission. Pt was seen in ED 4/12 for evaluation of fall and weakness but ultimately discharged home as her imaging studies were unremarkable. She now comes back, her PO intake is very poor, she is weak and has sustained several falls even in past few days or so. No complaints of fever or chills but apparently she was thought to have UTI on this recent ED visit (I can't confirm if she had any antibiotics on discharge). No abdominal pain, nausea or vomiting. No reports of blood in stool or urine.  In ED, vitals are stable with BP 129/90, HR 95-105 and Tmax 99.3 F. Oxygen saturation was 99 % on room air. Her UA did not show leukocytes on this admission but on 4/12 there were some leukocytes present. In addition, she had 1 episode of generalized seizure which has improved on its own somewhat as patient was still post-ictal. She did receive ativan 0.5 mg IV in ED. Neurology was consulted by ED physician.  Assessment and Plan:  Principal Problem: Acute encephalopathy, weakness, dehydration - unclear etiology but would start rocephin to treat possible UTI. Although her UA was unremarkable on this admission she did have some leukocytes on UA on 4/12. She was not given abx. She continues to have changes in mental status. Will order urine culture as well. Start IV fluids 75 cc/hr - PT evaluation for possible SNF placement versus CIR - neurology consultation requested as  well - may require psych eval once able to participate to eval psych meds she is currently taking (maybe they are too strong or not helping and needs readjustment)  Active Problems: Hypertension - start metoprolol 5 mg IV every 12 hours Seizure disorder - obtain EEG - ativan 1 mg every 4 hours PRN IV - neurology consulted  Code Status: Full Family Communication: Pt at bedside Disposition Plan: Admit for further evaluation  [name], MD  [facility] Pager [phone]  Review of Systems:  Constitutional: Negative for fever, chills and malaise/fatigue. Negative for diaphoresis.  HENT: Negative for hearing loss, ear pain, nosebleeds, congestion, sore throat, neck pain, tinnitus and ear discharge.   Eyes: Negative for blurred vision, double vision, photophobia, pain, discharge and redness.  Respiratory: Negative for cough, hemoptysis, sputum production, shortness of breath, wheezing and stridor.   Cardiovascular: Negative for chest pain, palpitations, orthopnea, claudication and leg swelling.  Gastrointestinal: Negative for nausea, vomiting and abdominal pain. Negative for heartburn, constipation, blood in stool and melena.  Genitourinary: Negative for dysuria, urgency, frequency, hematuria and flank pain.  Musculoskeletal: Negative for myalgias, back pain Skin: Negative for itching and rash.  Neurological: positive for weakness. Positive for seizure in ED  Endo/Heme/Allergies: Negative for environmental allergies and polydipsia. Does not bruise/bleed easily.  Psychiatric/Behavioral: Negative for suicidal ideas. The patient is not nervous/anxious.      Past Medical History  Diagnosis Date  . Hypertension   . High cholesterol   . Still's disease   . GERD (gastroesophageal reflux disease)   . Anemia   . Thyroid disease   .  H/O blood clots     pt states had blood clot in her rt arm  . Fall    Past Surgical History  Procedure Laterality Date  . Joint replacement    .  Abdominal hysterectomy    . Appendectomy    . Shoulder surgery     Social History:  reports that she quit smoking about 31 years ago. Her smoking use included Cigarettes. She has a 26 pack-year smoking history. She has never used smokeless tobacco. She reports that she does not drink alcohol or use illicit drugs.  Allergies  Allergen Reactions  . Codeine Palpitations    Family History  Problem Relation Age of Onset  . Rheum arthritis Mother   . Rheum arthritis Father      Prior to Admission medications   Medication Sig Start Date End Date Taking? Authorizing Provider  amantadine (SYMMETREL) 100 MG capsule Take 100 mg by mouth 2 (two) times daily.   Yes Historical Provider, MD  aspirin 81 MG tablet Take 81 mg by mouth at bedtime.   Yes Historical Provider, MD  buPROPion (WELLBUTRIN XL) 150 MG 24 hr tablet Take 150 mg by mouth daily after breakfast.   Yes Historical Provider, MD  clonazePAM (KLONOPIN) 1 MG tablet Take 1 mg by mouth at bedtime.    Yes Historical Provider, MD  ergocalciferol (VITAMIN D2) 50000 UNITS capsule Take 50,000 Units by mouth once a week. Wednesdays. For 12 weeks, ends on 04/21/14.   Yes Historical Provider, MD  fludrocortisone (FLORINEF) 0.1 MG tablet Take 0.1 mg by mouth daily after breakfast.   Yes Historical Provider, MD  HYDROcodone-acetaminophen (NORCO/VICODIN) 5-325 MG per tablet Take 1-2 tablets by mouth every 4 (four) hours as needed for moderate pain.   Yes Historical Provider, MD  levothyroxine (SYNTHROID, LEVOTHROID) 50 MCG tablet Take 50 mcg by mouth daily before breakfast.    Yes Historical Provider, MD  meclizine (ANTIVERT) 12.5 MG tablet Take 12.5 mg by mouth 2 (two) times daily as needed for dizziness. Twice a day for 1 week(reduced from TID starting on 4/13), then once a day for a week, then discontinue.   Yes Historical Provider, MD  methotrexate (RHEUMATREX) 2.5 MG tablet Take 15 mg by mouth once a week. Caution:Chemotherapy. Protect from  light. Takes on Wednesday's   Yes Historical Provider, MD  omeprazole (PRILOSEC) 40 MG capsule Take 1 capsule (40 mg total) by mouth daily. 12/25/12  Yes Tanda Rockers, MD  perphenazine (TRILAFON) 2 MG tablet Take 2 mg by mouth at bedtime.   Yes Historical Provider, MD  polyvinyl alcohol (LIQUIFILM TEARS) 1.4 % ophthalmic solution Place 1 drop into both eyes 3 (three) times daily as needed for dry eyes.   Yes Historical Provider, MD  sertraline (ZOLOFT) 100 MG tablet Take 100 mg by mouth at bedtime.   Yes Historical Provider, MD  zolpidem (AMBIEN) 5 MG tablet Take 5 mg by mouth at bedtime as needed for sleep.   Yes Historical Provider, MD   Physical Exam: Filed Vitals:   03/24/14 1203 03/24/14 1207 03/24/14 1400  BP: 134/83  137/69  Pulse: 95  103  Temp: 98.3 F (36.8 C)    TempSrc: Oral    Resp: 16  24  SpO2: 95% 94% 97%    Physical Exam  Constitutional: Appears well-developed and well-nourished. No distress.  HENT: Normocephalic. External right and left ear normal. Oropharynx is clear and moist.  Eyes: Conjunctivae and EOM are normal. PERRLA, no scleral icterus.  Neck:  Normal ROM. Neck supple. No JVD. No tracheal deviation. No thyromegaly.  CVS: RRR, S1/S2 +, no murmurs, no gallops, no carotid bruit.  Pulmonary: Effort and breath sounds normal, no stridor, rhonchi, wheezes, rales.  Abdominal: Soft. BS +,  no distension, tenderness, rebound or guarding.  Musculoskeletal: Normal range of motion. No edema and no tenderness.  Lymphadenopathy: No lymphadenopathy noted, cervical, inguinal. Neuro: Alert. Normal reflexes, muscle tone coordination. No cranial nerve deficit. Skin: Skin is warm and dry. No rash noted. Not diaphoretic. No erythema. No pallor.  Psychiatric: Normal mood and affect. Behavior, judgment, thought content normal.   Labs on Admission:  Basic Metabolic Panel:  Recent Labs Lab 03/21/14 1430  NA 142  K 4.4  CL 104  CO2 22  GLUCOSE 90  BUN 23  CREATININE 0.89   CALCIUM 9.6   Liver Function Tests: No results found for this basename: AST, ALT, ALKPHOS, BILITOT, PROT, ALBUMIN,  in the last 168 hours No results found for this basename: LIPASE, AMYLASE,  in the last 168 hours No results found for this basename: AMMONIA,  in the last 168 hours CBC:  Recent Labs Lab 03/21/14 1430 03/24/14 1351  WBC 10.3 6.7  NEUTROABS 8.0* 5.2  HGB 10.6* 10.1*  HCT 33.5* 32.0*  MCV 96.3 95.5  PLT 540* 410*   Cardiac Enzymes: No results found for this basename: CKTOTAL, CKMB, CKMBINDEX, TROPONINI,  in the last 168 hours BNP: No components found with this basename: POCBNP,  CBG:  Recent Labs Lab 03/24/14 1355  GLUCAP 114*    If 7PM-7AM, please contact night-coverage [email or web address] Password [email or web address] 03/24/2014, 2:26 PM

## 2014-03-24 NOTE — ED Notes (Signed)
MD at bedside. 

## 2014-03-24 NOTE — Progress Notes (Signed)
Pt family has an extensive medical background, Brother Rennis Petty is a 20yr [facility], Barnie Alderman Sister in Sports coach is a 72yr [place] nurse & Niece is a Education officer, museum at [employer]. The pt has no children of her own but these family members are caring for the pt at home and have a lot of concerns.   Family members have been staying with the pt 24 hours for the last 1.5 weeks, despite their care the pt has fallen 8 times in 1.5 weeks.   With pt visits to her Rheumatologist Dr Ouida Sills and also with home health PT Claiborne Billings) both stated that they feel there is a possible neuro problem due to her gate and inability to stand. Family also stated that pt's oral intake has been reduced with very little protein. Pt resort's to eating mostly cookies and snacks.

## 2014-03-24 NOTE — Progress Notes (Deleted)
Clinical Social Work Department BRIEF PSYCHOSOCIAL ASSESSMENT 03/24/2014  Patient:  Rhonda Davenport     Account Number:  [id number]     Admit date:  03/23/2014  Clinical Social Worker:  [employer]  Date/Time:  03/24/2014 03:24 PM  Referred by:  CSW  Date Referred:  03/24/2014 Referred for  SNF Placement   Other Referral:   Interview type:  Patient Other interview type:   and family    PSYCHOSOCIAL DATA Living Status:  ALONE Admitted from facility:   Level of care:   Primary support name:  Rennis Petty Primary support relationship to patient:  CHILD, ADULT Degree of support available:   strong    CURRENT CONCERNS Current Concerns  Post-Acute Placement   Other Concerns:    SOCIAL WORK ASSESSMENT / PLAN CSW met wiht pt and pt family at bedside. Pt alert to self and place however confused and not enaging in assessment. Pt currently lives at home alone with support from family. Pt has had multiple falls recently and has been weak. Pt famly interested in skilled nursing placement for short term rehab. Pt family interested in [place] skilled nursing facilities. CSW will complete fl2 for MD signature.   Assessment/plan status:  Psychosocial Support/Ongoing Assessment of Needs Other assessment/ plan:   Information/referral to community resources:   skilled nursing facility list    PATIENT'S/FAMILY'S RESPONSE TO PLAN OF CARE: Patient family thanked csw for concern and support.       Noreene Larsson [phone]  ED CSW 03/24/2014 1526pm

## 2014-03-24 NOTE — ED Notes (Signed)
Attempted in and out cath she did not have any urine return. Will try again once she gets some fluids.

## 2014-03-24 NOTE — ED Notes (Signed)
Pt in and out cath, with no urine output

## 2014-03-24 NOTE — ED Notes (Signed)
Family and tech reported pt had two seizures. First time family has seen pt have seizures. No incontinence or tongue injury noted.

## 2014-03-24 NOTE — ED Notes (Signed)
Per PTAR. Pt seen on Sunday  and treated for weakness at [facility]. Pt fell last night un witnessed and found on floor. Pt continues to be weak per family and has developed tremors. Denies N/V/D and fever. C/o of dizziness and denies pain

## 2014-03-24 NOTE — ED Notes (Signed)
Report given to [facility] on floor

## 2014-03-24 NOTE — Consult Note (Signed)
Reason for Consult: Altered mental status and generalized seizure.  HPI:                                                                                                                                          Rhonda Davenport is an 73 y.o. female with a history of rheumatoid arthritis, hypertension, GERD, hypercholesterolemia and hypothyroidism who presented to the emergency room today with weakness and mental status changes which have been progressing over several months. She was seen in the emergency room on 412 following a fall complaining of weakness. Evaluation was unremarkable and patient was discharged home. Intake of food and fluid reportedly has been poor for the past several days. Urinalysis and CBC were unremarkable. Comprehensive metabolic panel was unremarkable. Serum ammonia was 65. Patient had a witnessed generalized seizure in the emergency room. She has no previous history of seizure activity. CT scan of her head showed no acute intracranial abnormality. She's been taking Wellbutrin XL 150 mg every 24 hours. She was admitted for acute encephalopathy as well as weakness and dehydration. She's she is on Rocephin 1 g every 24 hours.  Past Medical History  Diagnosis Date  . Hypertension   . High cholesterol   . Still's disease   . GERD (gastroesophageal reflux disease)   . Anemia   . Thyroid disease   . H/O blood clots     pt states had blood clot in her rt arm  . Fall     Past Surgical History  Procedure Laterality Date  . Joint replacement    . Abdominal hysterectomy    . Appendectomy    . Shoulder surgery      Family History  Problem Relation Age of Onset  . Rheum arthritis Mother   . Rheum arthritis Father     Social History:  reports that she quit smoking about 31 years ago. Her smoking use included Cigarettes. She has a 26 pack-year smoking history. She has never used smokeless tobacco. She reports that she does not drink alcohol or use illicit drugs.  Allergies   Allergen Reactions  . Codeine Palpitations    MEDICATIONS:                                                                                                                     I have reviewed the patient's current medications.   ROS:  History obtained from sibling and the patient  General ROS: negative for - chills, fatigue, fever, night sweats, weight gain or weight loss Psychological ROS: negative for - behavioral disorder, hallucinations, memory difficulties, mood swings or suicidal ideation Ophthalmic ROS: negative for - blurry vision, double vision, eye pain or loss of vision ENT ROS: negative for - epistaxis, nasal discharge, oral lesions, sore throat, tinnitus or vertigo Allergy and Immunology ROS: negative for - hives or itchy/watery eyes Hematological and Lymphatic ROS: negative for - bleeding problems, bruising or swollen lymph nodes Endocrine ROS: negative for - galactorrhea, hair pattern changes, polydipsia/polyuria or temperature intolerance Respiratory ROS: negative for - cough, hemoptysis, shortness of breath or wheezing Cardiovascular ROS: negative for - chest pain, dyspnea on exertion, edema or irregular heartbeat Gastrointestinal ROS: negative for - abdominal pain, diarrhea, hematemesis, nausea/vomiting or stool incontinence Genito-Urinary ROS: negative for - dysuria, hematuria, incontinence or urinary frequency/urgency Musculoskeletal ROS: negative for - joint swelling or muscular weakness Neurological ROS: as noted in HPI Dermatological ROS: negative for rash and skin lesion changes   Blood pressure 149/77, pulse 93, temperature 99.1 F (37.3 C), temperature source Oral, resp. rate 22, height _0  (1.448 m), weight 49.2 kg (108 lb 7.5 oz), SpO2 99.00%.   Neurologic Examination:                                                                                                       Mental Status: Somewhat drowsy, oriented, thought content appropriate.  Speech fluent without evidence of aphasia. Able to follow commands without difficulty. Cranial Nerves: II-Visual fields were normal. III/IV/VI-Pupils were equal and reacted. Extraocular movements were full and conjugate.    V/VII-no facial numbness and no facial weakness. VIII-normal. X-normal speech and symmetrical palatal movement. XII-midline tongue extension Motor: 5/5 bilaterally with normal tone and bulk; tremulous at times, involving upper and lower extremities. Sensory: Normal throughout. Deep Tendon Reflexes: 1+ and symmetric. Plantars: Flexor bilaterally Cerebellar: Normal finger-to-nose testing. Meningeal signs: Negative.  No results found for this basename: cbc, bmp, coags, chol, tri, ldl, hga1c    Results for orders placed during the hospital encounter of 03/24/14 (from the past 48 hour(s))  CBC WITH DIFFERENTIAL     Status: Abnormal   Collection Time    03/24/14  1:51 PM      Result Value Ref Range   WBC 6.7  4.0 - 10.5 K/uL   RBC 3.35 (*) 3.87 - 5.11 MIL/uL   Hemoglobin 10.1 (*) 12.0 - 15.0 g/dL   HCT 32.0 (*) 36.0 - 46.0 %   MCV 95.5  78.0 - 100.0 fL   MCH 30.1  26.0 - 34.0 pg   MCHC 31.6  30.0 - 36.0 g/dL   RDW 15.1  11.5 - 15.5 %   Platelets 410 (*) 150 - 400 K/uL   Neutrophils Relative % 78 (*) 43 - 77 %   Neutro Abs 5.2  1.7 - 7.7 K/uL   Lymphocytes Relative 11 (*) 12 - 46 %   Lymphs Abs 0.7  0.7 - 4.0 K/uL   Monocytes Relative  10  3 - 12 %   Monocytes Absolute 0.7  0.1 - 1.0 K/uL   Eosinophils Relative 2  0 - 5 %   Eosinophils Absolute 0.1  0.0 - 0.7 K/uL   Basophils Relative 0  0 - 1 %   Basophils Absolute 0.0  0.0 - 0.1 K/uL  COMPREHENSIVE METABOLIC PANEL     Status: Abnormal   Collection Time    03/24/14  1:51 PM      Result Value Ref Range   Sodium 145  137 - 147 mEq/L   Potassium 3.8  3.7 - 5.3 mEq/L   Chloride 108  96 - 112  mEq/L   CO2 22  19 - 32 mEq/L   Glucose, Bld 89  70 - 99 mg/dL   BUN 20  6 - 23 mg/dL   Creatinine, Ser 0.80  0.50 - 1.10 mg/dL   Calcium 9.1  8.4 - 10.5 mg/dL   Total Protein 6.9  6.0 - 8.3 g/dL   Albumin 3.7  3.5 - 5.2 g/dL   AST 18  0 - 37 U/L   ALT 14  0 - 35 U/L   Alkaline Phosphatase 88  39 - 117 U/L   Total Bilirubin 0.3  0.3 - 1.2 mg/dL   GFR calc non Af Amer 71 (*) >90 mL/min   GFR calc Af Amer 83 (*) >90 mL/min   Comment: (NOTE)     The eGFR has been calculated using the CKD EPI equation.     This calculation has not been validated in all clinical situations.     eGFR's persistently <90 mL/min signify possible Chronic Kidney     Disease.  AMMONIA     Status: Abnormal   Collection Time    03/24/14  1:52 PM      Result Value Ref Range   Ammonia 65 (*) 11 - 60 umol/L  CBG MONITORING, ED     Status: Abnormal   Collection Time    03/24/14  1:55 PM      Result Value Ref Range   Glucose-Capillary 114 (*) 70 - 99 mg/dL  I-STAT TROPOININ, ED     Status: None   Collection Time    03/24/14  2:15 PM      Result Value Ref Range   Troponin i, poc 0.03  0.00 - 0.08 ng/mL   Comment 3            Comment: Due to the release kinetics of cTnI,     a negative result within the first hours     of the onset of symptoms does not rule out     myocardial infarction with certainty.     If myocardial infarction is still suspected,     repeat the test at appropriate intervals.  I-STAT CG4 LACTIC ACID, ED     Status: Abnormal   Collection Time    03/24/14  2:17 PM      Result Value Ref Range   Lactic Acid, Venous 3.27 (*) 0.5 - 2.2 mmol/L  URINALYSIS, ROUTINE W REFLEX MICROSCOPIC     Status: None   Collection Time    03/24/14  3:00 PM      Result Value Ref Range   Color, Urine YELLOW  YELLOW   APPearance CLEAR  CLEAR   Specific Gravity, Urine 1.019  1.005 - 1.030   pH 6.5  5.0 - 8.0   Glucose, UA NEGATIVE  NEGATIVE mg/dL   Hgb urine dipstick NEGATIVE  NEGATIVE   Bilirubin Urine  NEGATIVE  NEGATIVE   Ketones, ur NEGATIVE  NEGATIVE mg/dL   Protein, ur NEGATIVE  NEGATIVE mg/dL   Urobilinogen, UA 1.0  0.0 - 1.0 mg/dL   Nitrite NEGATIVE  NEGATIVE   Leukocytes, UA NEGATIVE  NEGATIVE   Comment: MICROSCOPIC NOT DONE ON URINES WITH NEGATIVE PROTEIN, BLOOD, LEUKOCYTES, NITRITE, OR GLUCOSE <1000 mg/dL.  TSH     Status: None   Collection Time    03/24/14  3:00 PM      Result Value Ref Range   TSH 1.970  0.350 - 4.500 uIU/mL   Comment: Please note change in reference range.     Performed at [facility]     Status: None   Collection Time    03/24/14  3:06 PM      Result Value Ref Range   Magnesium 2.1  1.5 - 2.5 mg/dL  PHOSPHORUS     Status: None   Collection Time    03/24/14  3:06 PM      Result Value Ref Range   Phosphorus 2.9  2.3 - 4.6 mg/dL  TROPONIN I     Status: None   Collection Time    03/24/14  3:06 PM      Result Value Ref Range   Troponin I <0.30  <0.30 ng/mL   Comment:            Due to the release kinetics of cTnI,     a negative result within the first hours     of the onset of symptoms does not rule out     myocardial infarction with certainty.     If myocardial infarction is still suspected,     repeat the test at appropriate intervals.  MRSA PCR SCREENING     Status: None   Collection Time    03/24/14  4:28 PM      Result Value Ref Range   MRSA by PCR NEGATIVE  NEGATIVE   Comment:            The GeneXpert MRSA Assay (FDA     approved for NASAL specimens     only), is one component of a     comprehensive MRSA colonization     surveillance program. It is not     intended to diagnose MRSA     infection nor to guide or     monitor treatment for     MRSA infections.    Dg Hip Complete Left  03/24/2014   CLINICAL DATA:  Weakness, fall and left hip pain.  EXAM: LEFT HIP - COMPLETE 2+ VIEW  COMPARISON:  None.  FINDINGS: No acute fracture or dislocation is identified. Mild degenerative changes are seen involving the hip  joint. The bony pelvis appears unremarkable. Soft tissues are within normal limits. No bony lesions are seen.  IMPRESSION: No acute fracture identified.   Electronically Signed   By: Aletta Edouard M.D.   On: 03/24/2014 14:49   Dg Tibia/fibula Right  03/24/2014   CLINICAL DATA:  Weakness/fall  EXAM: RIGHT TIBIA AND FIBULA - 2 VIEW  COMPARISON:  None.  FINDINGS: No fracture or dislocation is seen.  Mild degenerative changes of the knee.  The visualized soft tissues are unremarkable.  IMPRESSION: No fracture or dislocation is seen.   Electronically Signed   By: Julian Hy M.D.   On: 03/24/2014 15:06   Ct Head Wo Contrast  03/24/2014   CLINICAL DATA:  Patient was seen in treated for weakness 3 days ago, patient fell last night and was found on floor, has developed tremors and complaining of dizziness  EXAM: CT HEAD WITHOUT CONTRAST  CT CERVICAL SPINE WITHOUT CONTRAST  TECHNIQUE: Multidetector CT imaging of the head and cervical spine was performed following the standard protocol without intravenous contrast. Multiplanar CT image reconstructions of the cervical spine were also generated.  COMPARISON:  CT HEAD W/O CM dated 03/21/2014; CT C SPINE W/O CM dated 03/21/2014  FINDINGS: CT HEAD FINDINGS  No skull fracture. Moderate inflammatory change left maxillary sinus. Moderate diffuse atrophy and low attenuation in the deep white matter. No evidence of hemorrhage, extra-axial fluid, or vascular territory infarct.  CT CERVICAL SPINE FINDINGS  No fracture. No prevertebral soft tissue swelling. Sharply reversed lordosis in the central cervical spine with grade 1 borderline grade 2 anterior listhesis of C4 on C5. Severe multilevel degenerative disc disease. C5 and C6 are fused.  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  Chronic intracranial involutional change. Mild to moderate inflammatory change in the maxillary sinus on the left   Electronically Signed   By:  Skipper Cliche M.D.   On: 03/24/2014 15:45   Ct Cervical Spine Wo Contrast  03/24/2014   CLINICAL DATA:  Patient was seen in treated for weakness 3 days ago, patient fell last night and was found on floor, has developed tremors and complaining of dizziness  EXAM: CT HEAD WITHOUT CONTRAST  CT CERVICAL SPINE WITHOUT CONTRAST  TECHNIQUE: Multidetector CT imaging of the head and cervical spine was performed following the standard protocol without intravenous contrast. Multiplanar CT image reconstructions of the cervical spine were also generated.  COMPARISON:  CT HEAD W/O CM dated 03/21/2014; CT C SPINE W/O CM dated 03/21/2014  FINDINGS: CT HEAD FINDINGS  No skull fracture. Moderate inflammatory change left maxillary sinus. Moderate diffuse atrophy and low attenuation in the deep white matter. No evidence of hemorrhage, extra-axial fluid, or vascular territory infarct.  CT CERVICAL SPINE FINDINGS  No fracture. No prevertebral soft tissue swelling. Sharply reversed lordosis in the central cervical spine with grade 1 borderline grade 2 anterior listhesis of C4 on C5. Severe multilevel degenerative disc disease. C5 and C6 are fused.  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  Chronic intracranial involutional change. Mild to moderate inflammatory change in the maxillary sinus on the left   Electronically Signed   By: Skipper Cliche M.D.   On: 03/24/2014 15:45   Assessment/Plan: 73 year old lady with new onset generalized seizure activity associated with complaints of weakness as well as mental status changes and poor oral intake. Etiology for generalized seizure is unclear. Wellbutrin may be a significant contributing factor, however. She has no signs of meningeal irritation.  Recommendations: 1. MRI of the brain without and with contrast media 2. EEG, routine to study 3. No antiepileptic drug unless patient has a witnessed recurrent seizure activity. 4.  Recommend holding Wellbutrin, as this may be precipitating agent for seizure activity 5. Ativan 1 mg IV when necessary if patient has a recurrent seizure. 6. Lumbar puncture deferred, as patient has no signs of meningeal irritation and mental status is normal at this point.  We will continue to follow this patient with you.  C.R. Nicole Kindred, MD [facility] [phone]  03/24/2014, 8:07 PM

## 2014-03-24 NOTE — Progress Notes (Signed)
Utilization Review completed.  Mckaylah Bettendorf RN CM  

## 2014-03-25 ENCOUNTER — Inpatient Hospital Stay (HOSPITAL_COMMUNITY): Payer: Medicare Other

## 2014-03-25 ENCOUNTER — Inpatient Hospital Stay (HOSPITAL_COMMUNITY)
Admit: 2014-03-25 | Discharge: 2014-03-25 | Disposition: A | Discharge status: Home / Self Care | Payer: Medicare Other | Attending: Internal Medicine | Admitting: Internal Medicine

## 2014-03-25 DIAGNOSIS — N390 Urinary tract infection, site not specified: Secondary | ICD-10-CM

## 2014-03-25 LAB — BASIC METABOLIC PANEL
BUN: 15 mg/dL (ref 6–23)
CALCIUM: 8.1 mg/dL — AB (ref 8.4–10.5)
CO2: 18 meq/L — AB (ref 19–32)
Chloride: 107 mEq/L (ref 96–112)
Creatinine, Ser: 0.72 mg/dL (ref 0.50–1.10)
GFR calc non Af Amer: 83 mL/min — ABNORMAL LOW (ref >90)
Glucose, Bld: 98 mg/dL (ref 70–99)
Potassium: 3.7 mEq/L (ref 3.7–5.3)
SODIUM: 143 meq/L (ref 137–147)

## 2014-03-25 LAB — CBC
HCT: 31.8 % — ABNORMAL LOW (ref 36.0–46.0)
Hemoglobin: 10 g/dL — ABNORMAL LOW (ref 12.0–15.0)
MCH: 30.2 pg (ref 26.0–34.0)
MCHC: 31.4 g/dL (ref 30.0–36.0)
MCV: 96.1 fL (ref 78.0–100.0)
PLATELETS: 393 10*3/uL (ref 150–400)
RBC: 3.31 MIL/uL — AB (ref 3.87–5.11)
RDW: 15.2 % (ref 11.5–15.5)
WBC: 8.3 10*3/uL (ref 4.0–10.5)

## 2014-03-25 LAB — TROPONIN I: Troponin I: <0.3 ng/mL (ref <0.30)

## 2014-03-25 MED ORDER — METOPROLOL TARTRATE 25 MG PO TABS
25.0000 mg | ORAL_TABLET | Freq: Two times a day (BID) | ORAL | Status: DC
  Administered 2014-03-25 – 2014-03-29 (×9): 25 mg via ORAL
  Filled 2014-03-25 (×10): qty 1

## 2014-03-25 MED ORDER — LORAZEPAM 2 MG/ML IJ SOLN
1.0000 mg | INTRAMUSCULAR | Status: DC | PRN
  Administered 2014-03-25 – 2014-03-28 (×7): 1 mg via INTRAVENOUS
  Filled 2014-03-25 (×7): qty 1

## 2014-03-25 NOTE — Progress Notes (Addendum)
[facility] PROGRESS NOTE  Rhonda Davenport FYB:[id number] DOB: [birth year] DOA: 03/24/2014 PCP: Lilian Coma., MD  Brief narrative: 73 year old pleasant female with multiple medical co-morbidities including but not limited to rheumatoid arthritis, anxiety and depression, hypertension, GERD who presented to [facility] ED 03/24/2014 with ongoing weakness and mental stats changes in past few months prior to this admission. Pt was seen in ED 4/12 for evaluation of fall and weakness but ultimately discharged home as her imaging studies were unremarkable.  In ED, vitals are stable with BP 129/90, HR 95-105 and Tmax 99.3 F. Oxygen saturation was 99 % on room air. Her UA did not show leukocytes on this admission but on 4/12 there were some leukocytes present. In addition, she had 1 episode of generalized seizure which has improved on its own. She did receive ativan 0.5 mg IV in ED but seizure stopped prior to receiving ativan. Neurology was consulted.  Assessment and Plan:   Principal Problem:  Acute encephalopathy, weakness, dehydration  - unclear etiology, ? Parkinson's disease / LBD; possible urinary tract infection area as noted in admission note patient was started on Rocephin even though urinalysis was unremarkable. On 03/21/2014 urinalysis showed leukocytes but patient was not treated for urinary tract infection at that time. - Continue IV fluids, normal saline at 75 cc an hour - Follow up on EEG and MRI brain. Appreciate neurology following - Patient may transfer out of step down unit to medical floor today - Physical therapy evaluation ordered; will follow up on recommendations  Active Problems:  Hypertension  - May continue metoprolol but we will switch to by mouth regimen Seizure disorder  - obtain EEG and MRI brain  - continue ativan 1 mg every 4 hours PRN IV  - neurology is following Rheumatoid arthritis - Continue methotrexate Hypothyroidism - Continue Synthroid History of  depression - Continue amantadine and Zoloft.  Code Status: Full  Family Communication: family at the bedside Disposition Plan: remains inpatient  [name], MD  [facility] Pager [phone]  If 7PM-7AM, please contact night-coverage [email or web address] Password [email or web address] 03/25/2014, 1:52 PM   LOS: 1 day   Consultants:  Neurology   Procedures:  EEG 03/25/2104  Antibiotics:  Rocephin 03/24/2104 -->  HPI/Subjective: Still has resting tremor noted in upper and lower extremities especially when she falls asleep.  Objective: Filed Vitals:   03/25/14 0600 03/25/14 0745 03/25/14 0800 03/25/14 1200  BP: 146/98  146/86   Pulse:   96   Temp:  98.5 F (36.9 C)  98.8 F (37.1 C)  TempSrc:  Oral  Oral  Resp: 25  16   Height:      Weight:      SpO2:   95%     Intake/Output Summary (Last 24 hours) at 03/25/14 1352 Last data filed at 03/25/14 1200  Gross per 24 hour  Intake   1100 ml  Output   1050 ml  Net     50 ml    Exam:   General:  Pt is alert, follows commands appropriately, not in acute distress  Cardiovascular: Regular rate and rhythm, S1/S2 appreciated  Respiratory: Clear to auscultation bilaterally, no wheezing, no crackles, no rhonchi  Abdomen: Soft, non tender, non distended, bowel sounds present, no guarding  Extremities: No edema, pulses DP and PT palpable bilaterally  Neuro: Grossly nonfocal  Data Reviewed: Basic Metabolic Panel:  Recent Labs Lab 03/21/14 1430 03/24/14 1351 03/24/14 1506 03/25/14 0308  NA 142 145  --  143  K 4.4 3.8  --  3.7  CL 104 108  --  107  CO2 22 22  --  18*  GLUCOSE 90 89  --  98  BUN 23 20  --  15  CREATININE 0.89 0.80  --  0.72  CALCIUM 9.6 9.1  --  8.1*  MG  --   --  2.1  --   PHOS  --   --  2.9  --    Liver Function Tests:  Recent Labs Lab 03/24/14 1351  AST 18  ALT 14  ALKPHOS 88  BILITOT 0.3  PROT 6.9  ALBUMIN 3.7   No results found for this basename: LIPASE, AMYLASE,  in the last 168  hours  Recent Labs Lab 03/24/14 1352  AMMONIA 65*   CBC:  Recent Labs Lab 03/21/14 1430 03/24/14 1351 03/25/14 0308  WBC 10.3 6.7 8.3  NEUTROABS 8.0* 5.2  --   HGB 10.6* 10.1* 10.0*  HCT 33.5* 32.0* 31.8*  MCV 96.3 95.5 96.1  PLT 540* 410* 393   Cardiac Enzymes:  Recent Labs Lab 03/24/14 1506 03/24/14 2118 03/25/14 0305  TROPONINI <0.30 <0.30 <0.30   BNP: No components found with this basename: POCBNP,  CBG:  Recent Labs Lab 03/24/14 1355  GLUCAP 114*    MRSA PCR SCREENING     Status: None   Collection Time    03/24/14  4:28 PM      Result Value Ref Range Status   MRSA by PCR NEGATIVE  NEGATIVE Final     Studies: Dg Hip Complete Left 03/24/2014  IMPRESSION: No acute fracture identified.     Dg Tibia/fibula Right 03/24/2014   IMPRESSION: No fracture or dislocation is seen.   Ct Head Wo Contrast 03/24/2014    IMPRESSION: Severe degenerative changes in the cervical spine without acute traumatic injury.  Chronic intracranial involutional change. Mild to moderate inflammatory change in the maxillary sinus on the left     Ct Cervical Spine Wo Contrast 03/24/2014    IMPRESSION: Severe degenerative changes in the cervical spine without acute traumatic injury.  Chronic intracranial involutional change. Mild to moderate inflammatory change in the maxillary sinus on the left      Scheduled Meds: . amantadine  100 mg Oral BID  . aspirin  81 mg Oral QHS  . cefTRIAXone (ROCEPHIN)  IV  1 g Intravenous Q24H  . enoxaparin (LOVENOX) injection  40 mg Subcutaneous Q24H  . fludrocortisone  0.1 mg Oral QPC breakfast  . levothyroxine  50 mcg Oral QAC breakfast  . [START ON 03/31/2014] methotrexate  15 mg Oral Weekly  . metoprolol  5 mg Intravenous Q12H  . pantoprazole  40 mg Oral Daily  . perphenazine  2 mg Oral QHS  . sertraline  100 mg Oral QHS   Continuous Infusions: . sodium chloride 75 mL/hr at 03/25/14 0800

## 2014-03-25 NOTE — Progress Notes (Addendum)
Neuro checks are (-): Cranial Nerves asessment, Orientation assessment. However following commands is delayed and is accompanied by spastic, non purposeful movement / tremors it is especially noted when attempting to log roll the pt to change pad/sheet from incontinence, the pt becomes stiff almost in a locked state.

## 2014-03-25 NOTE — Progress Notes (Signed)
Pt has given [place] permission to speak to Brother Rennis Petty , Sister in [place] and Niece Alison Murray regarding her medical issues.

## 2014-03-25 NOTE — Procedures (Signed)
History: 73 year old female presenting with seizure  Sedation: None  Technique: This is a 17 channel routine scalp EEG performed at the bedside with bipolar and monopolar montages arranged in accordance to the international 10/20 system of electrode placement. One channel was dedicated to EKG recording.    Background: The background consists of intermixed alpha and beta activities. There is excessive beta activity. There is a well defined posterior dominant rhythm of  9 Hz that attenuates with eye opening. There is tremor artifact accompanied by the technician noting the patient having tremors. There is no clear electrographic discharge accompanying this.  Photic stimulation: Physiologic driving is not performed  EEG Abnormalities: None  Clinical Interpretation: This normal EEG is recorded in the waking and drowsy state. There was no seizure or seizure predisposition recorded on this study. The tremor recorded was not associated with any electrographic seizure activity. The excess beta activity is likely medication effect.  Roland Rack, MD [facility] [phone]  If 7pm- 7am, please page neurology on call as listed in [place].

## 2014-03-25 NOTE — Progress Notes (Signed)
EEG completed; results pending.    

## 2014-03-25 NOTE — Progress Notes (Signed)
Clinical Social Work Department BRIEF PSYCHOSOCIAL ASSESSMENT 03/25/2014  Patient:  Rhonda Davenport, Rhonda Davenport     Account Number:  [id number]     Admit date:  03/24/2014  Clinical Social Worker:  [employer]  Date/Time:  03/24/2014 03:24 PM  Referred by:  CSW  Date Referred:  03/25/2014 Referred for  SNF Placement   Other Referral:   Interview type:  Patient Other interview type:   and family    PSYCHOSOCIAL DATA Living Status:  ALONE Admitted from facility:   Level of care:   Primary support name:  Rennis Petty Primary support relationship to patient:  CHILD, ADULT Degree of support available:   strong, son    CURRENT CONCERNS Current Concerns  Post-Acute Placement   Other Concerns:    SOCIAL WORK ASSESSMENT / PLAN CSW met with pt and pt family at bedside. Pt alert to self and place however confused and not enaging in assessment. Pt currently lives at home alone with support from family. Pt has had multiple falls recently and has been weak. Pt famly interested in skilled nursing placement for short term rehab. Pt family interested in [place] skilled nursing facilities. CSW will complete fl2 for MD signature.   Assessment/plan status:  Psychosocial Support/Ongoing Assessment of Needs Other assessment/ plan:   Information/referral to community resources:   skilled nursing facility list    PATIENT'S/FAMILY'S RESPONSE TO PLAN OF CARE: Pt family thanked csw for concern and support. Pt family hopeful for patient to be able to go to short term rehab once medically stable.       Noreene Larsson [phone]  ED CSW 03/25/2014 1137am

## 2014-03-25 NOTE — Progress Notes (Signed)
NEURO HOSPITALIST PROGRESS NOTE   SUBJECTIVE:                                                                                                                        No further seizure events over night.  Has periods of confusion per nurse. patient has no complaints.   OBJECTIVE:                                                                                                                           Vital signs in last 24 hours: Temp:  [98.3 F (36.8 C)-99.8 F (37.7 C)] 98.5 F (36.9 C) (04/16 0745) Pulse Rate:  [85-105] 96 (04/16 0800) Resp:  [16-28] 16 (04/16 0800) BP: (127-210)/(43-191) 146/86 mmHg (04/16 0800) SpO2:  [94 %-100 %] 95 % (04/16 0800) Weight:  [49.2 kg (108 lb 7.5 oz)] 49.2 kg (108 lb 7.5 oz) (04/15 1625)  Intake/Output from previous day: 04/15 0701 - 04/16 0700 In: 1100 [I.V.:1050; IV Piggyback:50] Out: 550 [Urine:550] Intake/Output this shift: Total I/O In: -  Out: 200 [Urine:200] Nutritional status: NPO  Past Medical History  Diagnosis Date  . Hypertension   . High cholesterol   . Still's disease   . GERD (gastroesophageal reflux disease)   . Anemia   . Thyroid disease   . H/O blood clots     pt states had blood clot in her rt arm  . Fall       Neurologic Exam:   Mental Status:  awake, oriented, thought content appropriate. Speech fluent without evidence of aphasia. Able to follow commands without difficulty.  Cranial Nerves:  II-Visual fields were normal.  III/IV/VI-Pupils were equal and reactive. Extraocular movements were full and conjugate.  V/VII-no facial numbness and no facial weakness.  VIII-normal.  X-normal speech and symmetrical palatal movement.  XII-midline tongue extension  Motor: 5/5 bilaterally with normal tone and bulk; tremulous at times, involving upper and lower extremities. Postural tremor noted left greater than right. At rest she showed intermittent jerking throughout but not  parkinsonian.  Sensory: Normal throughout.  Deep Tendon Reflexes: 2+ and symmetric Bilateral UE and KJ 1+ AJ Plantars: Flexor bilaterally  Cerebellar: Normal finger-to-nose testing.    Lab Results: Basic Metabolic Panel:  Recent Labs Lab 03/21/14 1430 03/24/14 1351 03/24/14 1506 03/25/14 0308  NA 142 145  --  143  K 4.4 3.8  --  3.7  CL 104 108  --  107  CO2 22 22  --  18*  GLUCOSE 90 89  --  98  BUN 23 20  --  15  CREATININE 0.89 0.80  --  0.72  CALCIUM 9.6 9.1  --  8.1*  MG  --   --  2.1  --   PHOS  --   --  2.9  --     Liver Function Tests:  Recent Labs Lab 03/24/14 1351  AST 18  ALT 14  ALKPHOS 88  BILITOT 0.3  PROT 6.9  ALBUMIN 3.7   No results found for this basename: LIPASE, AMYLASE,  in the last 168 hours  Recent Labs Lab 03/24/14 1352  AMMONIA 65*    CBC:  Recent Labs Lab 03/21/14 1430 03/24/14 1351 03/25/14 0308  WBC 10.3 6.7 8.3  NEUTROABS 8.0* 5.2  --   HGB 10.6* 10.1* 10.0*  HCT 33.5* 32.0* 31.8*  MCV 96.3 95.5 96.1  PLT 540* 410* 393    Cardiac Enzymes:  Recent Labs Lab 03/24/14 1506 03/24/14 2118 03/25/14 0305  TROPONINI <0.30 <0.30 <0.30    Lipid Panel: No results found for this basename: CHOL, TRIG, HDL, CHOLHDL, VLDL, LDLCALC,  in the last 168 hours  CBG:  Recent Labs Lab 03/24/14 1355  [place] 114*    Microbiology: Results for orders placed during the hospital encounter of 03/24/14  MRSA PCR SCREENING     Status: None   Collection Time    03/24/14  4:28 PM      Result Value Ref Range Status   MRSA by PCR NEGATIVE  NEGATIVE Final   Comment:            The GeneXpert MRSA Assay (FDA     approved for NASAL specimens     only), is one component of a     comprehensive MRSA colonization     surveillance program. It is not     intended to diagnose MRSA     infection nor to guide or     monitor treatment for     MRSA infections.    Coagulation Studies: No results found for this basename: LABPROT, INR,  in  the last 72 hours  Imaging: Dg Hip Complete Left  03/24/2014   CLINICAL DATA:  Weakness, fall and left hip pain.  EXAM: LEFT HIP - COMPLETE 2+ VIEW  COMPARISON:  None.  FINDINGS: No acute fracture or dislocation is identified. Mild degenerative changes are seen involving the hip joint. The bony pelvis appears unremarkable. Soft tissues are within normal limits. No bony lesions are seen.  IMPRESSION: No acute fracture identified.   Electronically Signed   By: Aletta Edouard M.D.   On: 03/24/2014 14:49   Dg Tibia/fibula Right  03/24/2014   CLINICAL DATA:  Weakness/fall  EXAM: RIGHT TIBIA AND FIBULA - 2 VIEW  COMPARISON:  None.  FINDINGS: No fracture or dislocation is seen.  Mild degenerative changes of the knee.  The visualized soft tissues are unremarkable.  IMPRESSION: No fracture or dislocation is seen.   Electronically Signed   By: Julian Hy M.D.   On: 03/24/2014 15:06   Ct Head Wo Contrast  03/24/2014   CLINICAL DATA:  Patient was seen in treated for weakness 3 days ago, patient fell last night and was found  on floor, has developed tremors and complaining of dizziness  EXAM: CT HEAD WITHOUT CONTRAST  CT CERVICAL SPINE WITHOUT CONTRAST  TECHNIQUE: Multidetector CT imaging of the head and cervical spine was performed following the standard protocol without intravenous contrast. Multiplanar CT image reconstructions of the cervical spine were also generated.  COMPARISON:  CT HEAD W/O CM dated 03/21/2014; CT C SPINE W/O CM dated 03/21/2014  FINDINGS: CT HEAD FINDINGS  No skull fracture. Moderate inflammatory change left maxillary sinus. Moderate diffuse atrophy and low attenuation in the deep white matter. No evidence of hemorrhage, extra-axial fluid, or vascular territory infarct.  CT CERVICAL SPINE FINDINGS  No fracture. No prevertebral soft tissue swelling. Sharply reversed lordosis in the central cervical spine with grade 1 borderline grade 2 anterior listhesis of C4 on C5. Severe multilevel  degenerative disc disease. C5 and C6 are fused.  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  Chronic intracranial involutional change. Mild to moderate inflammatory change in the maxillary sinus on the left   Electronically Signed   By: Skipper Cliche M.D.   On: 03/24/2014 15:45   Ct Cervical Spine Wo Contrast  03/24/2014   CLINICAL DATA:  Patient was seen in treated for weakness 3 days ago, patient fell last night and was found on floor, has developed tremors and complaining of dizziness  EXAM: CT HEAD WITHOUT CONTRAST  CT CERVICAL SPINE WITHOUT CONTRAST  TECHNIQUE: Multidetector CT imaging of the head and cervical spine was performed following the standard protocol without intravenous contrast. Multiplanar CT image reconstructions of the cervical spine were also generated.  COMPARISON:  CT HEAD W/O CM dated 03/21/2014; CT C SPINE W/O CM dated 03/21/2014  FINDINGS: CT HEAD FINDINGS  No skull fracture. Moderate inflammatory change left maxillary sinus. Moderate diffuse atrophy and low attenuation in the deep white matter. No evidence of hemorrhage, extra-axial fluid, or vascular territory infarct.  CT CERVICAL SPINE FINDINGS  No fracture. No prevertebral soft tissue swelling. Sharply reversed lordosis in the central cervical spine with grade 1 borderline grade 2 anterior listhesis of C4 on C5. Severe multilevel degenerative disc disease. C5 and C6 are fused.  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  Chronic intracranial involutional change. Mild to moderate inflammatory change in the maxillary sinus on the left   Electronically Signed   By: Skipper Cliche M.D.   On: 03/24/2014 15:45       MEDICATIONS                                                                                                                        Scheduled: . amantadine  100 mg Oral BID  . aspirin  81 mg Oral QHS  . cefTRIAXone  (ROCEPHIN)  IV  1 g Intravenous Q24H  . enoxaparin (LOVENOX) injection  40 mg Subcutaneous Q24H  . fludrocortisone  0.1 mg Oral QPC breakfast  . levothyroxine  50 mcg Oral QAC breakfast  . [  START ON 03/31/2014] methotrexate  15 mg Oral Weekly  . metoprolol  5 mg Intravenous Q12H  . pantoprazole  40 mg Oral Daily  . perphenazine  2 mg Oral QHS  . sertraline  100 mg Oral QHS    ASSESSMENT/PLAN:                                                                                                             73 year old lady with new onset generalized seizure activity associated with complaints of weakness as well as mental status changes and poor oral intake. Etiology for generalized seizure is unclear. No further seizures over night and Wellbutrin has been DC'd.   Recommendations:  1. MRI of the brain without and with contrast media  2. EEG, routine to study  3. No antiepileptic drug unless patient has a witnessed recurrent seizure activity. 4. Ativan 1 mg IV when necessary if patient has a recurrent seizure    Assessment and plan discussed with with attending physician and they are in agreement.    Etta Quill PA-C [facility] [phone]  03/25/2014, 9:25 AM

## 2014-03-26 ENCOUNTER — Inpatient Hospital Stay (HOSPITAL_COMMUNITY): Payer: Medicare Other

## 2014-03-26 DIAGNOSIS — F05 Delirium due to known physiological condition: Secondary | ICD-10-CM

## 2014-03-26 DIAGNOSIS — R410 Disorientation, unspecified: Secondary | ICD-10-CM

## 2014-03-26 LAB — URINE CULTURE
Colony Count: NO GROWTH
Culture: NO GROWTH

## 2014-03-26 MED ORDER — SODIUM CHLORIDE 0.9 % IV SOLN
1000.0000 mg | Freq: Once | INTRAVENOUS | Status: AC
  Administered 2014-03-26: 1000 mg via INTRAVENOUS
  Filled 2014-03-26: qty 10

## 2014-03-26 MED ORDER — LEVETIRACETAM 500 MG/5ML IV SOLN
500.0000 mg | Freq: Two times a day (BID) | INTRAVENOUS | Status: DC
  Administered 2014-03-26 – 2014-03-27 (×2): 500 mg via INTRAVENOUS
  Filled 2014-03-26 (×3): qty 5

## 2014-03-26 NOTE — Consult Note (Signed)
[facility] Face-to-Face Psychiatry Consult   Reason for Consult:  Confusion and medication management Referring Physician:  [name] is an 73 y.o. female. Total Time spent with patient: 30 minutes  Assessment: AXIS I:  Mood Disorder NOS, Psychotic Disorder NOS and Delirium disorder AXIS II:  Deferred AXIS III:   Past Medical History  Diagnosis Date  . Hypertension   . High cholesterol   . Still's disease   . GERD (gastroesophageal reflux disease)   . Anemia   . Thyroid disease   . H/O blood clots     pt states had blood clot in her rt arm  . Fall    AXIS IV:  other psychosocial or environmental problems and problems related to social environment AXIS V:  31-40 impairment in reality testing  Plan:  Patient does not meet criteria for psychiatric inpatient admission. Medication management  Subjective:   Rhonda Davenport is a 73 y.o. female patient admitted with change in mental status.  HPI:  The patient seen chart reviewed.  Patient is a poor historian and it is very difficult to obtain information from her.  The patient is a 72 year old Caucasian female who was admitted because of change in her mental status .  Provisional diagnosis is acute encephalopathy.  She was given antibiotic and recently neurology seen her and recommended to start Espino.  Patient appears very confused, disoriented and tremulous.  She appears very anxious and tense.  She believes that she is at her mother's house.  Her eyes were closed most of the time.  She was able to respond on command and able to answer the question about memory however she appears disoriented and confused.  She has difficulty remaining things.  She is seeing psychiatrist Thayer Headings at [facility] but do not remember the details of her medication.  Patient denies any suicidal thoughts or homicidal thoughts.  She was getting Wellbutrin, Klonopin, Trilafon and amantadine which has been hold.  Patient denies any paranoia or any  auditory hallucination but does appear having some visual hallucination.  She's not agitated or aggressive.  She is unable to provide much information.  Patient endorses history of depression in her husband died in 68.  Patient denies any drinking or using any other substances.   Past Psychiatric History: Past Medical History  Diagnosis Date  . Hypertension   . High cholesterol   . Still's disease   . GERD (gastroesophageal reflux disease)   . Anemia   . Thyroid disease   . H/O blood clots     pt states had blood clot in her rt arm  . Fall     reports that she quit smoking about 31 years ago. Her smoking use included Cigarettes. She has a 26 pack-year smoking history. She has never used smokeless tobacco. She reports that she does not drink alcohol or use illicit drugs. Family History  Problem Relation Age of Onset  . Rheum arthritis Mother   . Rheum arthritis Father      Living Arrangements: Alone   Abuse/Neglect [facility]) Physical Abuse: Denies Verbal Abuse: Denies Sexual Abuse: Denies Allergies:   Allergies  Allergen Reactions  . Codeine Palpitations    ACT Assessment Complete:  No:   Past Psychiatric History: Patient has history of depression and seen Thayer Headings at [facility]. Objective: Blood pressure 176/82, pulse 85, temperature 98.5 F (36.9 C), temperature source Oral, resp. rate 33, height _0  (1.448 m), weight 110 lb 0.2 oz (  49.9 kg), SpO2 100.00%.Body mass index is 23.8 kg/(m^2). Results for orders placed during the hospital encounter of 03/24/14 (from the past 72 hour(s))  CBC WITH DIFFERENTIAL     Status: Abnormal   Collection Time    03/24/14  1:51 PM      Result Value Ref Range   WBC 6.7  4.0 - 10.5 K/uL   RBC 3.35 (*) 3.87 - 5.11 MIL/uL   Hemoglobin 10.1 (*) 12.0 - 15.0 g/dL   HCT 32.0 (*) 36.0 - 46.0 %   MCV 95.5  78.0 - 100.0 fL   MCH 30.1  26.0 - 34.0 pg   MCHC 31.6  30.0 - 36.0 g/dL   RDW 15.1  11.5 - 15.5 %   Platelets 410 (*) 150 -  400 K/uL   Neutrophils Relative % 78 (*) 43 - 77 %   Neutro Abs 5.2  1.7 - 7.7 K/uL   Lymphocytes Relative 11 (*) 12 - 46 %   Lymphs Abs 0.7  0.7 - 4.0 K/uL   Monocytes Relative 10  3 - 12 %   Monocytes Absolute 0.7  0.1 - 1.0 K/uL   Eosinophils Relative 2  0 - 5 %   Eosinophils Absolute 0.1  0.0 - 0.7 K/uL   Basophils Relative 0  0 - 1 %   Basophils Absolute 0.0  0.0 - 0.1 K/uL  COMPREHENSIVE METABOLIC PANEL     Status: Abnormal   Collection Time    03/24/14  1:51 PM      Result Value Ref Range   Sodium 145  137 - 147 mEq/L   Potassium 3.8  3.7 - 5.3 mEq/L   Chloride 108  96 - 112 mEq/L   CO2 22  19 - 32 mEq/L   Glucose, Bld 89  70 - 99 mg/dL   BUN 20  6 - 23 mg/dL   Creatinine, Ser 0.80  0.50 - 1.10 mg/dL   Calcium 9.1  8.4 - 10.5 mg/dL   Total Protein 6.9  6.0 - 8.3 g/dL   Albumin 3.7  3.5 - 5.2 g/dL   AST 18  0 - 37 U/L   ALT 14  0 - 35 U/L   Alkaline Phosphatase 88  39 - 117 U/L   Total Bilirubin 0.3  0.3 - 1.2 mg/dL   GFR calc non Af Amer 71 (*) >90 mL/min   GFR calc Af Amer 83 (*) >90 mL/min   Comment: (NOTE)     The eGFR has been calculated using the CKD EPI equation.     This calculation has not been validated in all clinical situations.     eGFR's persistently <90 mL/min signify possible Chronic Kidney     Disease.  AMMONIA     Status: Abnormal   Collection Time    03/24/14  1:52 PM      Result Value Ref Range   Ammonia 65 (*) 11 - 60 umol/L  CBG MONITORING, ED     Status: Abnormal   Collection Time    03/24/14  1:55 PM      Result Value Ref Range   Glucose-Capillary 114 (*) 70 - 99 mg/dL  I-STAT TROPOININ, ED     Status: None   Collection Time    03/24/14  2:15 PM      Result Value Ref Range   Troponin i, poc 0.03  0.00 - 0.08 ng/mL   Comment 3  Comment: Due to the release kinetics of cTnI,     a negative result within the first hours     of the onset of symptoms does not rule out     myocardial infarction with certainty.     If myocardial  infarction is still suspected,     repeat the test at appropriate intervals.  I-STAT CG4 LACTIC ACID, ED     Status: Abnormal   Collection Time    03/24/14  2:17 PM      Result Value Ref Range   Lactic Acid, Venous 3.27 (*) 0.5 - 2.2 mmol/L  URINALYSIS, ROUTINE W REFLEX MICROSCOPIC     Status: None   Collection Time    03/24/14  3:00 PM      Result Value Ref Range   Color, Urine YELLOW  YELLOW   APPearance CLEAR  CLEAR   Specific Gravity, Urine 1.019  1.005 - 1.030   pH 6.5  5.0 - 8.0   Glucose, UA NEGATIVE  NEGATIVE mg/dL   Hgb urine dipstick NEGATIVE  NEGATIVE   Bilirubin Urine NEGATIVE  NEGATIVE   Ketones, ur NEGATIVE  NEGATIVE mg/dL   Protein, ur NEGATIVE  NEGATIVE mg/dL   Urobilinogen, UA 1.0  0.0 - 1.0 mg/dL   Nitrite NEGATIVE  NEGATIVE   Leukocytes, UA NEGATIVE  NEGATIVE   Comment: MICROSCOPIC NOT DONE ON URINES WITH NEGATIVE PROTEIN, BLOOD, LEUKOCYTES, NITRITE, OR GLUCOSE <1000 mg/dL.  TSH     Status: None   Collection Time    03/24/14  3:00 PM      Result Value Ref Range   TSH 1.970  0.350 - 4.500 uIU/mL   Comment: Please note change in reference range.     Performed at [facility]     Status: None   Collection Time    03/24/14  3:00 PM      Result Value Ref Range   Specimen Description URINE, CATHETERIZED     Special Requests NONE     Culture  Setup Time       Value: 03/25/2014 09:05     Performed at [place] Count       Value: NO GROWTH     Performed at [facility]   Culture       Value: NO GROWTH     Performed at [facility]   Report Status 03/26/2014 FINAL    MAGNESIUM     Status: None   Collection Time    03/24/14  3:06 PM      Result Value Ref Range   Magnesium 2.1  1.5 - 2.5 mg/dL  PHOSPHORUS     Status: None   Collection Time    03/24/14  3:06 PM      Result Value Ref Range   Phosphorus 2.9  2.3 - 4.6 mg/dL  TROPONIN I     Status: None   Collection Time    03/24/14  3:06 PM       Result Value Ref Range   Troponin I <0.30  <0.30 ng/mL   Comment:            Due to the release kinetics of cTnI,     a negative result within the first hours     of the onset of symptoms does not rule out     myocardial infarction with certainty.     If myocardial infarction is still suspected,  repeat the test at appropriate intervals.  MRSA PCR SCREENING     Status: None   Collection Time    03/24/14  4:28 PM      Result Value Ref Range   MRSA by PCR NEGATIVE  NEGATIVE   Comment:            The GeneXpert MRSA Assay (FDA     approved for NASAL specimens     only), is one component of a     comprehensive MRSA colonization     surveillance program. It is not     intended to diagnose MRSA     infection nor to guide or     monitor treatment for     MRSA infections.  TROPONIN I     Status: None   Collection Time    03/24/14  9:18 PM      Result Value Ref Range   Troponin I <0.30  <0.30 ng/mL   Comment:            Due to the release kinetics of cTnI,     a negative result within the first hours     of the onset of symptoms does not rule out     myocardial infarction with certainty.     If myocardial infarction is still suspected,     repeat the test at appropriate intervals.  TROPONIN I     Status: None   Collection Time    03/25/14  3:05 AM      Result Value Ref Range   Troponin I <0.30  <0.30 ng/mL   Comment:            Due to the release kinetics of cTnI,     a negative result within the first hours     of the onset of symptoms does not rule out     myocardial infarction with certainty.     If myocardial infarction is still suspected,     repeat the test at appropriate intervals.  BASIC METABOLIC PANEL     Status: Abnormal   Collection Time    03/25/14  3:08 AM      Result Value Ref Range   Sodium 143  137 - 147 mEq/L   Potassium 3.7  3.7 - 5.3 mEq/L   Chloride 107  96 - 112 mEq/L   CO2 18 (*) 19 - 32 mEq/L   Glucose, Bld 98  70 - 99 mg/dL   BUN 15  6 - 23  mg/dL   Creatinine, Ser 0.72  0.50 - 1.10 mg/dL   Calcium 8.1 (*) 8.4 - 10.5 mg/dL   GFR calc non Af Amer 83 (*) >90 mL/min   GFR calc Af Amer >90  >90 mL/min   Comment: (NOTE)     The eGFR has been calculated using the CKD EPI equation.     This calculation has not been validated in all clinical situations.     eGFR's persistently <90 mL/min signify possible Chronic Kidney     Disease.  CBC     Status: Abnormal   Collection Time    03/25/14  3:08 AM      Result Value Ref Range   WBC 8.3  4.0 - 10.5 K/uL   RBC 3.31 (*) 3.87 - 5.11 MIL/uL   Hemoglobin 10.0 (*) 12.0 - 15.0 g/dL   HCT 31.8 (*) 36.0 - 46.0 %   MCV 96.1  78.0 - 100.0 fL   MCH 30.2  26.0 - 34.0 pg   MCHC 31.4  30.0 - 36.0 g/dL   RDW 15.2  11.5 - 15.5 %   Platelets 393  150 - 400 K/uL   Labs are reviewed.  Current Facility-Administered Medications  Medication Dose Route Frequency Provider Last Rate Last Dose  . 0.9 %  sodium chloride infusion   Intravenous Continuous [name], MD 75 mL/hr at 03/26/14 1235    . amantadine (SYMMETREL) capsule 100 mg  100 mg Oral BID [name], MD   100 mg at 03/26/14 1443  . aspirin chewable tablet 81 mg  81 mg Oral QHS [name], MD   81 mg at 03/25/14 2114  . cefTRIAXone (ROCEPHIN) 1 g in dextrose 5 % 50 mL IVPB  1 g Intravenous Q24H [name], MD   1 g at 03/25/14 2000  . enoxaparin (LOVENOX) injection 40 mg  40 mg Subcutaneous Q24H [name], MD   40 mg at 03/24/14 1839  . fludrocortisone (FLORINEF) tablet 0.1 mg  0.1 mg Oral QPC breakfast [name], MD   0.1 mg at 03/26/14 0800  . levETIRAcetam (KEPPRA) 500 mg in sodium chloride 0.9 % 100 mL IVPB  500 mg Intravenous Q12H Marliss Coots, PA-C      . levothyroxine (SYNTHROID, LEVOTHROID) tablet 50 mcg  50 mcg Oral QAC breakfast [name], MD   50 mcg at 03/26/14 0757  . LORazepam (ATIVAN) injection 1 mg  1 mg Intravenous Q4H PRN Theodis Blaze, MD   1 mg at 03/26/14 1627  . [START ON 03/31/2014] methotrexate  (RHEUMATREX) tablet 15 mg  15 mg Oral Weekly [name], MD      . metoprolol tartrate (LOPRESSOR) tablet 25 mg  25 mg Oral BID [name], MD   25 mg at 03/26/14 1540  . ondansetron (ZOFRAN) tablet 4 mg  4 mg Oral Q6H PRN [name], MD       Or  . ondansetron [facility]) injection 4 mg  4 mg Intravenous Q6H PRN [name], MD      . pantoprazole (PROTONIX) EC tablet 40 mg  40 mg Oral Daily [name], MD   40 mg at 03/26/14 0867  . perphenazine (TRILAFON) tablet 2 mg  2 mg Oral QHS [name], MD   2 mg at 03/25/14 2114  . sertraline (ZOLOFT) tablet 100 mg  100 mg Oral QHS [name], MD   100 mg at 03/25/14 2114    Psychiatric Specialty Exam:     Blood pressure 176/82, pulse 85, temperature 98.5 F (36.9 C), temperature source Oral, resp. rate 33, height _0  (1.448 m), weight 110 lb 0.2 oz (49.9 kg), SpO2 100.00%.Body mass index is 23.8 kg/(m^2).  General Appearance: Bizarre and Guarded  Eye Contact::  None  Speech:  Normal Rate  Volume:  Decreased  Mood:  Anxious  Affect:  Constricted, Depressed and Restricted  Thought Process:  Loose  Orientation:  NA  Thought Content:  Hallucinations: Visual and Rumination  Suicidal Thoughts:  No  Homicidal Thoughts:  No  Memory:  Immediate;   Poor Recent;   Poor Remote;   Fair  Judgement:  Impaired  Insight:  Shallow  Psychomotor Activity:  Decreased, Restlessness and Tremor  Concentration:  Poor  Recall:  Poor  Fund of Knowledge:Poor  Language: Fair  Akathisia:  Negative  Handed:  Right  AIMS (if indicated):     Assets:  Communication Skills Housing  Sleep:      Musculoskeletal: Strength & Muscle Tone: Tremors Gait & Station: Patient lying on the bed Patient leans: N/A  Treatment Plan Summary: Medication management, patient could have delirium disorder due to general medical condition.  Her amantadine and Wellbutrin has been discontinued.  Agreed with that.  Neurology recently started [place].  At this time  she is taking Trilafon.  Use benzodiazepine more frequently to help anxiety.  The patient has confusion and restlessness.  Patient does not need inpatient psychiatric treatment.  Consultation liaison will followup if needed.  Please call [phone] if you have any further questions.  Dossie Der T Chai Routh 03/26/2014 5:59 PM

## 2014-03-26 NOTE — Progress Notes (Addendum)
NEURO HOSPITALIST PROGRESS NOTE   SUBJECTIVE:                                                                                                                        Patient comfortable in room. follows commands. Tremor has decreased since yesterday  OBJECTIVE:                                                                                                                           Vital signs in last 24 hours: Temp:  [98.4 F (36.9 C)-99 F (37.2 C)] 99 F (37.2 C) (04/17 0327) Pulse Rate:  [77-119] 85 (04/17 0937) Resp:  [15-40] 33 (04/17 0900) BP: (121-179)/(82-146) 176/82 mmHg (04/17 0937) SpO2:  [94 %-100 %] 100 % (04/17 0900) Weight:  [49.9 kg (110 lb 0.2 oz)] 49.9 kg (110 lb 0.2 oz) (04/17 0128)  Intake/Output from previous day: 04/16 0701 - 04/17 0700 In: 34 [IV Piggyback:50] Out: 900 [Urine:900] Intake/Output this shift:   Nutritional status: General  Past Medical History  Diagnosis Date  . Hypertension   . High cholesterol   . Still's disease   . GERD (gastroesophageal reflux disease)   . Anemia   . Thyroid disease   . H/O blood clots     pt states had blood clot in her rt arm  . Fall       Neurologic Exam:  Mental Status:  awake, oriented, thought content appropriate. Speech fluent without evidence of aphasia. Able to follow commands without difficulty.  Cranial Nerves:  II-Visual fields were normal.  III/IV/VI-Pupils were equal and reactive. Extraocular movements were full and conjugate.  V/VII-no facial numbness and no facial weakness.  VIII-normal.  X-normal speech and symmetrical palatal movement.  XII-midline tongue extension  Motor: 5/5 bilaterally with normal tone and bulk; tremulous at times, involving upper and lower extremities. Postural tremor noted left greater than right. At rest she showed intermittent jerking throughout but not parkinsonian.  Sensory: Normal throughout.  Deep Tendon Reflexes: 2+ and  symmetric Bilateral UE and KJ 1+ AJ  Plantars: Flexor bilaterally  Cerebellar: Normal finger-to-nose testing.    Lab Results: Basic Metabolic Panel:  Recent Labs Lab 03/21/14 1430 03/24/14 1351 03/24/14 1506 03/25/14 0308  NA 142  145  --  143  K 4.4 3.8  --  3.7  CL 104 108  --  107  CO2 22 22  --  18*  GLUCOSE 90 89  --  98  BUN 23 20  --  15  CREATININE 0.89 0.80  --  0.72  CALCIUM 9.6 9.1  --  8.1*  MG  --   --  2.1  --   PHOS  --   --  2.9  --     Liver Function Tests:  Recent Labs Lab 03/24/14 1351  AST 18  ALT 14  ALKPHOS 88  BILITOT 0.3  PROT 6.9  ALBUMIN 3.7   No results found for this basename: LIPASE, AMYLASE,  in the last 168 hours  Recent Labs Lab 03/24/14 1352  AMMONIA 65*    CBC:  Recent Labs Lab 03/21/14 1430 03/24/14 1351 03/25/14 0308  WBC 10.3 6.7 8.3  NEUTROABS 8.0* 5.2  --   HGB 10.6* 10.1* 10.0*  HCT 33.5* 32.0* 31.8*  MCV 96.3 95.5 96.1  PLT 540* 410* 393    Cardiac Enzymes:  Recent Labs Lab 03/24/14 1506 03/24/14 2118 03/25/14 0305  TROPONINI <0.30 <0.30 <0.30    Lipid Panel: No results found for this basename: CHOL, TRIG, HDL, CHOLHDL, VLDL, LDLCALC,  in the last 168 hours  CBG:  Recent Labs Lab 03/24/14 1355  [place] 114*    Microbiology: Results for orders placed during the hospital encounter of 03/24/14  URINE CULTURE     Status: None   Collection Time    03/24/14  3:00 PM      Result Value Ref Range Status   Specimen Description URINE, CATHETERIZED   Final   Special Requests NONE   Final   Culture  Setup Time     Final   Value: 03/25/2014 09:05     Performed at [facility] Count     Final   Value: NO GROWTH     Performed at [facility]   Culture     Final   Value: NO GROWTH     Performed at [facility]   Report Status 03/26/2014 FINAL   Final  MRSA PCR SCREENING     Status: None   Collection Time    03/24/14  4:28 PM      Result Value Ref Range Status    MRSA by PCR NEGATIVE  NEGATIVE Final   Comment:            The GeneXpert MRSA Assay (FDA     approved for NASAL specimens     only), is one component of a     comprehensive MRSA colonization     surveillance program. It is not     intended to diagnose MRSA     infection nor to guide or     monitor treatment for     MRSA infections.    Coagulation Studies: No results found for this basename: LABPROT, INR,  in the last 72 hours  Imaging: Dg Hip Complete Left  03/24/2014   CLINICAL DATA:  Weakness, fall and left hip pain.  EXAM: LEFT HIP - COMPLETE 2+ VIEW  COMPARISON:  None.  FINDINGS: No acute fracture or dislocation is identified. Mild degenerative changes are seen involving the hip joint. The bony pelvis appears unremarkable. Soft tissues are within normal limits. No bony lesions are seen.  IMPRESSION: No acute fracture identified.   Electronically Signed  By: Aletta Edouard M.D.   On: 03/24/2014 14:49   Dg Tibia/fibula Right  03/24/2014   CLINICAL DATA:  Weakness/fall  EXAM: RIGHT TIBIA AND FIBULA - 2 VIEW  COMPARISON:  None.  FINDINGS: No fracture or dislocation is seen.  Mild degenerative changes of the knee.  The visualized soft tissues are unremarkable.  IMPRESSION: No fracture or dislocation is seen.   Electronically Signed   By: Julian Hy M.D.   On: 03/24/2014 15:06   Ct Head Wo Contrast  03/24/2014   CLINICAL DATA:  Patient was seen in treated for weakness 3 days ago, patient fell last night and was found on floor, has developed tremors and complaining of dizziness  EXAM: CT HEAD WITHOUT CONTRAST  CT CERVICAL SPINE WITHOUT CONTRAST  TECHNIQUE: Multidetector CT imaging of the head and cervical spine was performed following the standard protocol without intravenous contrast. Multiplanar CT image reconstructions of the cervical spine were also generated.  COMPARISON:  CT HEAD W/O CM dated 03/21/2014; CT C SPINE W/O CM dated 03/21/2014  FINDINGS: CT HEAD FINDINGS  No skull  fracture. Moderate inflammatory change left maxillary sinus. Moderate diffuse atrophy and low attenuation in the deep white matter. No evidence of hemorrhage, extra-axial fluid, or vascular territory infarct.  CT CERVICAL SPINE FINDINGS  No fracture. No prevertebral soft tissue swelling. Sharply reversed lordosis in the central cervical spine with grade 1 borderline grade 2 anterior listhesis of C4 on C5. Severe multilevel degenerative disc disease. C5 and C6 are fused.  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  Chronic intracranial involutional change. Mild to moderate inflammatory change in the maxillary sinus on the left   Electronically Signed   By: Skipper Cliche M.D.   On: 03/24/2014 15:45   Ct Cervical Spine Wo Contrast  03/24/2014   CLINICAL DATA:  Patient was seen in treated for weakness 3 days ago, patient fell last night and was found on floor, has developed tremors and complaining of dizziness  EXAM: CT HEAD WITHOUT CONTRAST  CT CERVICAL SPINE WITHOUT CONTRAST  TECHNIQUE: Multidetector CT imaging of the head and cervical spine was performed following the standard protocol without intravenous contrast. Multiplanar CT image reconstructions of the cervical spine were also generated.  COMPARISON:  CT HEAD W/O CM dated 03/21/2014; CT C SPINE W/O CM dated 03/21/2014  FINDINGS: CT HEAD FINDINGS  No skull fracture. Moderate inflammatory change left maxillary sinus. Moderate diffuse atrophy and low attenuation in the deep white matter. No evidence of hemorrhage, extra-axial fluid, or vascular territory infarct.  CT CERVICAL SPINE FINDINGS  No fracture. No prevertebral soft tissue swelling. Sharply reversed lordosis in the central cervical spine with grade 1 borderline grade 2 anterior listhesis of C4 on C5. Severe multilevel degenerative disc disease. C5 and C6 are fused.  IMPRESSION: Severe degenerative changes in the cervical spine without acute traumatic  injury.  Chronic intracranial involutional change. Mild to moderate inflammatory change in the maxillary sinus on the left   Electronically Signed   By: Skipper Cliche M.D.   On: 03/24/2014 15:45   Dg Chest Port 1 View  03/25/2014   CLINICAL DATA:  Rule out radio opaque foreign bodies or metallic foreign bodies prior to MRI  EXAM: PORTABLE CHEST - 1 VIEW  COMPARISON:  DG CHEST 2 VIEW dated 12/19/2012; MR HEAD WO/W CM dated 02/28/2013  FINDINGS: An electronic cardiac monitoring device is implanted in the left lower chest wall. The lungs are adequately inflated.  The interstitial markings are mildly prominent. The cardiopericardial silhouette is normal in size. There prosthetic shoulder joints bilaterally.  IMPRESSION: There is a metallic electronic cardiac monitoring device that projects over the lower thorax on the left which is likely a contraindication to MRI unless it is known to be MRI safe. The patient underwent cranial MRI in March of 2014 and had the prosthetic shoulder joints at that time. The electronic monitoring device was not present on a pre MRI chest x-ray dated December 19, 2012.   Electronically Signed   By: David  Martinique   On: 03/25/2014 14:10       MEDICATIONS                                                                                                                        Scheduled: . amantadine  100 mg Oral BID  . aspirin  81 mg Oral QHS  . cefTRIAXone (ROCEPHIN)  IV  1 g Intravenous Q24H  . enoxaparin (LOVENOX) injection  40 mg Subcutaneous Q24H  . fludrocortisone  0.1 mg Oral QPC breakfast  . levothyroxine  50 mcg Oral QAC breakfast  . [START ON 03/31/2014] methotrexate  15 mg Oral Weekly  . metoprolol tartrate  25 mg Oral BID  . pantoprazole  40 mg Oral Daily  . perphenazine  2 mg Oral QHS  . sertraline  100 mg Oral QHS   EEG: Clinical Interpretation: This normal EEG is recorded in the waking and drowsy state. There was no seizure or seizure predisposition recorded on  this study. The tremor recorded was not associated with any electrographic seizure activity. The excess beta activity is likely medication effect  ASSESSMENT/PLAN:                                                                                                            73 year old lady with new onset generalized seizure activity.  No further seizures over night and Wellbutrin has been DC'd. Continues to have resting and postural tremor but this has decreased. EEG normal with excess beta activity which likely is medication effect. Primary currently looking into removing loop recorder in order to get MRI.   Due to continued confusion would start AED for possible subclinical seizure activity.  Will start Keppra 500 mg BID with 1 gram load. Will follow   Assessment and plan discussed with with attending physician and they are in agreement.    Etta Quill PA-C [facility] [phone]  03/26/2014, 10:04 AM

## 2014-03-26 NOTE — Progress Notes (Signed)
Pt has medtronic implantable loop recorder that could have precluded pt from getting an MRI of brain and cervical spine. MD, MRI and pt family aware that recorder could be a contraindication of MRI. Monitor placement procedure report from cardiology office and MD Araby report faxed to MRI. Radiologist confirmed that pt can have MRI with monitor in place. However, radiologist conveyed manufacturer's advice  that cardiologist be present for MRI since nothing will be recorded during  MRI. MD Charlies Silvers made aware that nothing will be recorded and affirmed that no cardiologist was needed for MRI. Pt removed from telemetry as per MD's consent and central telemetry notified of pt's transfer to MRI.

## 2014-03-26 NOTE — Progress Notes (Signed)
Clinical Social Work  CSW attempted to meet with patient but patient out of room for procedure. CSW spoke with niece who was able to provide background history on patient. Patient has previous diagnosis of depression and anxiety. Patient's depression increased after her husband's death in 04/02/2005. Patient receives medication management from [facility] but does not attend any therapy. Patient does not use any substances. Patient has been confused at times but patient's family have noticed a decline in the past 6 months.   CSW will continue to follow in order to complete full assessment.  [place], [place] [phone]

## 2014-03-26 NOTE — Progress Notes (Addendum)
CSW met with patient, brother, sister-in-law & niece at bedside - provided SNF bed offers. They have accepted bed @ [facility] - anticipating discharge early next week. CSW will follow-up then.   Clinical Social Work Department CLINICAL SOCIAL WORK PLACEMENT NOTE 03/26/2014  Patient:  Rhonda Davenport, Rhonda Davenport  Account Number:  [id number] Admit date:  03/24/2014  Clinical Social Worker:  Renold Genta  Date/time:  03/25/2014 10:23 AM  Clinical Social Work is seeking post-discharge placement for this patient at the following level of care:   SKILLED NURSING   (*CSW will update this form in Epic as items are completed)   03/25/2014  Patient/family provided with [facility] Department of Clinical Social Work's list of facilities offering this level of care within the geographic area requested by the patient (or if unable, by the patient's family).  03/25/2014  Patient/family informed of their freedom to choose among providers that offer the needed level of care, that participate in Medicare, Medicaid or managed care program needed by the patient, have an available bed and are willing to accept the patient.  03/25/2014  Patient/family informed of [facility]' ownership interest in [facility], as well as of the fact that they are under no obligation to receive care at this facility.  PASARR submitted to EDS on 03/25/2014 PASARR number received from EDS on 03/25/2014  FL2 transmitted to all facilities in geographic area requested by pt/family on  03/25/2014 FL2 transmitted to all facilities within larger geographic area on   Patient informed that his/her managed care company has contracts with or will negotiate with  certain facilities, including the following:     Patient/family informed of bed offers received:  03/25/2014 Patient chooses bed at [facility] Physician recommends and patient chooses bed at    Patient to be transferred to [facility] on  03/29/14 Patient to be transferred to facility by [facility]  The following physician request were entered in Epic:   Additional Comments:   Raynaldo Opitz, [place] Worker cell #: [phone]

## 2014-03-26 NOTE — Progress Notes (Signed)
[facility] PROGRESS NOTE  Rhonda Davenport YWV:[id number] DOB: 06-13-1941 DOA: 03/24/2014 PCP: Lilian Coma., MD  Brief narrative: 73 year old pleasant female with multiple medical co-morbidities including but not limited to rheumatoid arthritis, anxiety and depression, hypertension, GERD who presented to [facility] ED 03/24/2014 with ongoing weakness and mental stats changes in past few months prior to this admission. Pt was seen in ED 4/12 for evaluation of fall and weakness but ultimately discharged home as her imaging studies were unremarkable.  In ED, vitals are stable with BP 129/90, HR 95-105 and Tmax 99.3 F. Oxygen saturation was 99 % on room air. Her UA did not show leukocytes on this admission but on 4/12 there were some leukocytes present. In addition, she had 1 episode of generalized seizure which has improved on its own. She did receive ativan 0.5 mg IV in ED but seizure stopped prior to receiving ativan. Neurology was consulted. EEG did not reveal acute seizure activity.  Assessment and Plan:   Principal Problem:  Acute encephalopathy, weakness, dehydration  - unclear etiology, ? Parkinson's disease / LBD; possible urinary tract infection area as noted in admission note patient was started on Rocephin even though urinalysis was unremarkable. On 03/21/2014 urinalysis showed leukocytes but patient was not treated for urinary tract infection at that time.  - per neurology, concern is for subclinical seizure; started on Keppra 03/26/2014 (loading dose and then 500 mg IV Q 12 hours) - Continue IV fluids, normal saline at 75 cc an hour; will stop NS in next 24 hours as we just changed PO intake from NPO to regular diet  - EEG did not reveal seizure activity. Will see if loop recorder can be removed so MRI  Brain can be done - Patient may transfer out of step down unit to medical floor today  - Physical therapy evaluation ordered; will follow up on recommendations  Active Problems:   Hypertension  - May continue metoprolol  Seizure disorder  - As mentioned above, possible subclinical seizure - on Keppra per neurology  - continue ativan 1 mg every 4 hours PRN IV  - neurology is following  Rheumatoid arthritis  - Continue methotrexate  Hypothyroidism  - Continue Synthroid  History of depression  - Continue amantadine and Zoloft.  - Psych consulted   Code Status: Full  Family Communication: family at the bedside  Disposition Plan: remains inpatient   Consultants:  Neurology  Psychiatry  Procedures:  EEG 03/25/2104 Antibiotics:  Rocephin 03/24/2104 -->   [name], MD  [facility] Pager [phone]  If 7PM-7AM, please contact night-coverage [email or web address] Password [facility] 03/26/2014, 9:39 AM   LOS: 2 days    HPI/Subjective: No significant overnight events.  Objective: Filed Vitals:   03/26/14 0700 03/26/14 0800 03/26/14 0900 03/26/14 0937  BP:    176/82  Pulse: 88 86 81 85  Temp:      TempSrc:      Resp: 31 23 33   Height:      Weight:      SpO2: 99% 99% 100%     Intake/Output Summary (Last 24 hours) at 03/26/14 0939 Last data filed at 03/25/14 2100  Gross per 24 hour  Intake     50 ml  Output    700 ml  Net   -650 ml    Exam:   General:  Pt is alert, follows commands appropriately, not in acute distress  Cardiovascular: Regular rate and rhythm, S1/S2, no murmurs, no rubs, no gallops  Respiratory: Clear to auscultation bilaterally, no wheezing, no crackles, no rhonchi  Abdomen: Soft, non tender, non distended, bowel sounds present, no guarding  Extremities: No edema, pulses DP and PT palpable bilaterally  Neuro: Grossly nonfocal  Data Reviewed: Basic Metabolic Panel:  Recent Labs Lab 03/21/14 1430 03/24/14 1351 03/24/14 1506 03/25/14 0308  NA 142 145  --  143  K 4.4 3.8  --  3.7  CL 104 108  --  107  CO2 22 22  --  18*  GLUCOSE 90 89  --  98  BUN 23 20  --  15  CREATININE 0.89 0.80  --  0.72  CALCIUM  9.6 9.1  --  8.1*  MG  --   --  2.1  --   PHOS  --   --  2.9  --    Liver Function Tests:  Recent Labs Lab 03/24/14 1351  AST 18  ALT 14  ALKPHOS 88  BILITOT 0.3  PROT 6.9  ALBUMIN 3.7   No results found for this basename: LIPASE, AMYLASE,  in the last 168 hours  Recent Labs Lab 03/24/14 1352  AMMONIA 65*   CBC:  Recent Labs Lab 03/21/14 1430 03/24/14 1351 03/25/14 0308  WBC 10.3 6.7 8.3  NEUTROABS 8.0* 5.2  --   HGB 10.6* 10.1* 10.0*  HCT 33.5* 32.0* 31.8*  MCV 96.3 95.5 96.1  PLT 540* 410* 393   Cardiac Enzymes:  Recent Labs Lab 03/24/14 1506 03/24/14 2118 03/25/14 0305  TROPONINI <0.30 <0.30 <0.30   BNP: No components found with this basename: POCBNP,  CBG:  Recent Labs Lab 03/24/14 1355  GLUCAP 114*    URINE CULTURE     Status: None   Collection Time    03/24/14  3:00 PM      Result Value Ref Range Status   Specimen Description URINE, CATHETERIZED   Final   Value: NO GROWTH     Performed at [facility]   Report Status 03/26/2014 FINAL   Final  MRSA PCR SCREENING     Status: None   Collection Time    03/24/14  4:28 PM      Result Value Ref Range Status   MRSA by PCR NEGATIVE  NEGATIVE Final     Studies: Dg Hip Complete Left 03/24/2014    IMPRESSION: No acute fracture identified.     Dg Tibia/fibula Right 03/24/2014    IMPRESSION: No fracture or dislocation is seen.     Ct Head Wo Contrast 03/24/2014    IMPRESSION: Severe degenerative changes in the cervical spine without acute traumatic injury.  Chronic intracranial involutional change. Mild to moderate inflammatory change in the maxillary sinus on the left     Ct Cervical Spine Wo Contrast 03/24/2014    IMPRESSION: Severe degenerative changes in the cervical spine without acute traumatic injury.  Chronic intracranial involutional change. Mild to moderate inflammatory change in the maxillary sinus on the left     Dg Chest Port 1 View 03/25/2014 IMPRESSION: There is a metallic  electronic cardiac monitoring device that projects over the lower thorax on the left which is likely a contraindication to MRI unless it is known to be MRI safe. The patient underwent cranial MRI in March of 2014 and had the prosthetic shoulder joints at that time. The electronic monitoring device was not present on a pre MRI chest x-ray dated December 19, 2012.    Scheduled Meds: . amantadine  100 mg Oral BID  . aspirin  81 mg Oral QHS  . cefTRIAXone (ROCEPHIN)  IV  1 g Intravenous Q24H  . enoxaparin (LOVENOX) injection  40 mg Subcutaneous Q24H  . fludrocortisone  0.1 mg Oral QPC breakfast  . levothyroxine  50 mcg Oral QAC breakfast  .  methotrexate  15 mg Oral Weekly  . metoprolol tartrate  25 mg Oral BID  . pantoprazole  40 mg Oral Daily  . perphenazine  2 mg Oral QHS  . sertraline  100 mg Oral QHS   Continuous Infusions: . sodium chloride 75 mL/hr at 03/26/14 0200

## 2014-03-27 ENCOUNTER — Encounter (HOSPITAL_COMMUNITY): Payer: Self-pay | Admitting: Psychiatry

## 2014-03-27 DIAGNOSIS — F29 Unspecified psychosis not due to a substance or known physiological condition: Secondary | ICD-10-CM

## 2014-03-27 LAB — CORTISOL: CORTISOL PLASMA: 15 ug/dL

## 2014-03-27 MED ORDER — POLYVINYL ALCOHOL 1.4 % OP SOLN
1.0000 [drp] | Freq: Three times a day (TID) | OPHTHALMIC | Status: DC | PRN
  Administered 2014-03-27: 1 [drp] via OPHTHALMIC
  Filled 2014-03-27: qty 15

## 2014-03-27 NOTE — Consult Note (Signed)
03/27/2014 3:12 PM [name] Face-to-Face Psychiatry Consult   Reason for Consult:  Confusion and medication management Referring Physician:  [name] is an 73 y.o. female. Total Time spent with patient: 30 minutes  Assessment: AXIS I:  Mood Disorder NOS, Psychotic Disorder NOS and Delirium disorder AXIS II:  Deferred AXIS III:   Past Medical History  Diagnosis Date  . Hypertension   . High cholesterol   . Still's disease   . GERD (gastroesophageal reflux disease)   . Anemia   . Thyroid disease   . H/O blood clots     pt states had blood clot in her rt arm  . Fall    AXIS IV:  other psychosocial or environmental problems and problems related to social environment AXIS V:  31-40 impairment in reality testing  Plan:  Patient does not meet criteria for psychiatric inpatient admission. Medication management  Subjective:   Rhonda Davenport is a 73 y.o. female patient admitted with change in mental status.  HPI:  The patient seen chart reviewed.  Her niece Rhonda Davenport is here and she was also helpful in providing history.  The patient is a 73 year old Caucasian female who was admitted because of change in her mental status .  Provisional diagnosis is acute encephalopathy.  She was given antibiotic and recently neurology seen her and recommended to start [place].  Compared to yesterday's note she is much improved. She still mildly tremulous but is alert and oriented x3. She still is not sure why things have gone bad at home but states that she was "falling apart" according to the record she has been falling more at home over the last several months. Her niece states that her sister manages her medications and there haven't been any major changes. Her MRI and EEG have been negative. She is much more alert today than she was yesterday. .  She is seeing psychiatrist Thayer Headings at [facility] but do not remember the details of her medication.  Patient denies any suicidal  thoughts or homicidal thoughts.  She was getting Wellbutrin, Klonopin, Trilafon and amantadine which has been held except for Trilafon. She is also on Zoloft.  Patient denies any paranoia or any auditory hallucination. Overall her mental status is much improved compared to yesterday   Past Psychiatric History: Past Medical History  Diagnosis Date  . Hypertension   . High cholesterol   . Still's disease   . GERD (gastroesophageal reflux disease)   . Anemia   . Thyroid disease   . H/O blood clots     pt states had blood clot in her rt arm  . Fall     reports that she quit smoking about 31 years ago. Her smoking use included Cigarettes. She has a 26 pack-year smoking history. She has never used smokeless tobacco. She reports that she does not drink alcohol or use illicit drugs. Family History  Problem Relation Age of Onset  . Rheum arthritis Mother   . Rheum arthritis Father      Living Arrangements: Alone   Abuse/Neglect [facility]) Physical Abuse: Denies Verbal Abuse: Denies Sexual Abuse: Denies Allergies:   Allergies  Allergen Reactions  . Codeine Palpitations    ACT Assessment Complete:  No:   Past Psychiatric History: Patient has history of depression and seen Thayer Headings at [facility]. Objective: Blood pressure 136/64, pulse 73, temperature 97.3 F (36.3 C), temperature source Oral, resp. rate 19, height 4' 9"  (1.448  m), weight 108 lb 14.5 oz (49.4 kg), SpO2 98.00%.Body mass index is 23.56 kg/(m^2). Results for orders placed during the hospital encounter of 03/24/14 (from the past 72 hour(s))  MAGNESIUM     Status: None   Collection Time    03/24/14  3:06 PM      Result Value Ref Range   Magnesium 2.1  1.5 - 2.5 mg/dL  PHOSPHORUS     Status: None   Collection Time    03/24/14  3:06 PM      Result Value Ref Range   Phosphorus 2.9  2.3 - 4.6 mg/dL  TROPONIN I     Status: None   Collection Time    03/24/14  3:06 PM      Result Value Ref Range   Troponin I <0.30   <0.30 ng/mL   Comment:            Due to the release kinetics of cTnI,     a negative result within the first hours     of the onset of symptoms does not rule out     myocardial infarction with certainty.     If myocardial infarction is still suspected,     repeat the test at appropriate intervals.  MRSA PCR SCREENING     Status: None   Collection Time    03/24/14  4:28 PM      Result Value Ref Range   MRSA by PCR NEGATIVE  NEGATIVE   Comment:            The GeneXpert MRSA Assay (FDA     approved for NASAL specimens     only), is one component of a     comprehensive MRSA colonization     surveillance program. It is not     intended to diagnose MRSA     infection nor to guide or     monitor treatment for     MRSA infections.  TROPONIN I     Status: None   Collection Time    03/24/14  9:18 PM      Result Value Ref Range   Troponin I <0.30  <0.30 ng/mL   Comment:            Due to the release kinetics of cTnI,     a negative result within the first hours     of the onset of symptoms does not rule out     myocardial infarction with certainty.     If myocardial infarction is still suspected,     repeat the test at appropriate intervals.  TROPONIN I     Status: None   Collection Time    03/25/14  3:05 AM      Result Value Ref Range   Troponin I <0.30  <0.30 ng/mL   Comment:            Due to the release kinetics of cTnI,     a negative result within the first hours     of the onset of symptoms does not rule out     myocardial infarction with certainty.     If myocardial infarction is still suspected,     repeat the test at appropriate intervals.  BASIC METABOLIC PANEL     Status: Abnormal   Collection Time    03/25/14  3:08 AM      Result Value Ref Range   Sodium 143  137 - 147 mEq/L   Potassium 3.7  3.7 - 5.3 mEq/L   Chloride 107  96 - 112 mEq/L   CO2 18 (*) 19 - 32 mEq/L   Glucose, Bld 98  70 - 99 mg/dL   BUN 15  6 - 23 mg/dL   Creatinine, Ser 0.72  0.50 - 1.10  mg/dL   Calcium 8.1 (*) 8.4 - 10.5 mg/dL   GFR calc non Af Amer 83 (*) >90 mL/min   GFR calc Af Amer >90  >90 mL/min   Comment: (NOTE)     The eGFR has been calculated using the CKD EPI equation.     This calculation has not been validated in all clinical situations.     eGFR's persistently <90 mL/min signify possible Chronic Kidney     Disease.  CBC     Status: Abnormal   Collection Time    03/25/14  3:08 AM      Result Value Ref Range   WBC 8.3  4.0 - 10.5 K/uL   RBC 3.31 (*) 3.87 - 5.11 MIL/uL   Hemoglobin 10.0 (*) 12.0 - 15.0 g/dL   HCT 31.8 (*) 36.0 - 46.0 %   MCV 96.1  78.0 - 100.0 fL   MCH 30.2  26.0 - 34.0 pg   MCHC 31.4  30.0 - 36.0 g/dL   RDW 15.2  11.5 - 15.5 %   Platelets 393  150 - 400 K/uL   Labs are reviewed.  Current Facility-Administered Medications  Medication Dose Route Frequency Provider Last Rate Last Dose  . 0.9 %  sodium chloride infusion   Intravenous Continuous [name], MD 75 mL/hr at 03/27/14 [phone]    . aspirin chewable tablet 81 mg  81 mg Oral QHS [name], MD   81 mg at 03/26/14 2156  . cefTRIAXone (ROCEPHIN) 1 g in dextrose 5 % 50 mL IVPB  1 g Intravenous Q24H [name], MD   1 g at 03/26/14 2030  . enoxaparin (LOVENOX) injection 40 mg  40 mg Subcutaneous Q24H [name], MD   40 mg at 03/26/14 1721  . fludrocortisone (FLORINEF) tablet 0.1 mg  0.1 mg Oral QPC breakfast [name], MD   0.1 mg at 03/27/14 0835  . levETIRAcetam (KEPPRA) 500 mg in sodium chloride 0.9 % 100 mL IVPB  500 mg Intravenous Q12H Marliss Coots, PA-C   500 mg at 03/27/14 0946  . levothyroxine (SYNTHROID, LEVOTHROID) tablet 50 mcg  50 mcg Oral QAC breakfast [name], MD   50 mcg at 03/27/14 [phone]  . LORazepam (ATIVAN) injection 1 mg  1 mg Intravenous Q4H PRN Theodis Blaze, MD   1 mg at 03/27/14 0135  . [START ON 03/31/2014] methotrexate (RHEUMATREX) tablet 15 mg  15 mg Oral Weekly [name], MD      . metoprolol tartrate (LOPRESSOR) tablet 25 mg  25 mg  Oral BID [name], MD   25 mg at 03/27/14 0944  . ondansetron (ZOFRAN) tablet 4 mg  4 mg Oral Q6H PRN [name], MD       Or  . ondansetron [facility]) injection 4 mg  4 mg Intravenous Q6H PRN [name], MD      . pantoprazole (PROTONIX) EC tablet 40 mg  40 mg Oral Daily [name], MD   40 mg at 03/27/14 0944  . perphenazine (TRILAFON) tablet 2 mg  2 mg Oral QHS [name], MD   2 mg at 03/26/14 2200  .  polyvinyl alcohol (LIQUIFILM TEARS) 1.4 % ophthalmic solution 1 drop  1 drop Both Eyes TID PRN [name], MD      . sertraline (ZOLOFT) tablet 100 mg  100 mg Oral QHS [name], MD   100 mg at 03/26/14 2156    Psychiatric Specialty Exam:     Blood pressure 136/64, pulse 73, temperature 97.3 F (36.3 C), temperature source Oral, resp. rate 19, height 4' 9"  (1.448 m), weight 108 lb 14.5 oz (49.4 kg), SpO2 98.00%.Body mass index is 23.56 kg/(m^2).  General Appearance: Alert recently had her hair fixed   Eye Contact::  Fairly good   Speech:  Normal Rate  Volume:  Decreased  Mood:  Anxious  Affect:  Constricted and flat   Thought Process:  Fairly organized   Orientation:  Oriented to place and situation   Thought Content: Denies current hallucinations   Suicidal Thoughts:  No  Homicidal Thoughts:  No  Memory:  Immediate;   Poor Recent;   Poor Remote;   Fair  Judgement:  Impaired  Insight:  Shallow  Psychomotor Activity:  Decreased, Restlessness and Tremor the tremor has improved   Concentration:  Poor  Recall:  Poor  Fund of Knowledge:Poor  Language: Fair  Akathisia:  Negative  Handed:  Right  AIMS (if indicated):     Assets:  Communication Skills Housing family support   Sleep:      Musculoskeletal: Strength & Muscle Tone: Tremors Gait & Station: Patient lying on the bed Patient leans: N/A  Treatment Plan Summary: Medication management, patient could have delirium disorder due to general medical condition.  Her amantadine and Wellbutrin has been  discontinued.  Agreed with that.  Neurology recently started [place]. Her overall mental status is much improved  At this time she is taking Trilafon. In Zoloft without difficulty  Patient does not need inpatient psychiatric treatment.  Further psychiatric followup is needed please call [phone].    Levonne Spiller 03/27/2014 3:05 PM

## 2014-03-27 NOTE — Progress Notes (Signed)
Pt continues to have spastic episodes, pt alert and oriented to self, place, year but disoriented to situation. Pt will answer questions with correct answers but will go off rambling about unrelated things. PRN Ativan was administered twice. Pt currently in bed sleeping, no sign of distress noted.

## 2014-03-27 NOTE — Progress Notes (Signed)
Subjective: Patient having fewer episodes of confusion, but was improving even prior to keppra.   Exam: Filed Vitals:   03/27/14 1600  BP:   Pulse:   Temp: 98.3 F (36.8 C)  Resp:    Gen: In bed, NAD MS: Awake, alert, oriented x 3.  XI:HWTUU, EOMI Motor: MAEW, she has a postural/resting tremor that is intermittent, not clearly parkinsonian. Possible mild increase in tone on the lef.t  Sensory:intact to LT.    Impression: 73 yo F with increasing confusion and falls over the past 6 months to a year. Per her daughter she has had "acting out of her dreams" consistent with REM behavior disorder. She has had intermittent visual hallucinations for the past month(per patient) and appears to wax and wane. 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 She has been on perphenazine for a very long time, and I would favor stopping this at this time and using other medications with fewer extrapyramidal side effects. I discussed with the patient and she is not suicidal, has no intentions of hurting herself and has people that she could talk to if she were to develop that.   She has had a single seizure in teh setting of bupropion dosing. I am not at all sure that the falls or episodes of confusion represent seizure and would favor not continuing the keppra at this time.   Recommendations: 1) Stop perphenazine.  2) stop keppra.  3) Patient can follow up with [facility], [place], [place] [address]--[phone] or  [facility] neurology [address] [place], [place] [address] [phone] as an outpatient in 1 - 2 months once the perphenazine has had some time to get out of her system.  4) No other recommendations at this time, please call with any further questions or concerns.   Roland Rack, MD [facility] [phone]  If 7pm- 7am, please page neurology on call as listed in [place].

## 2014-03-27 NOTE — Progress Notes (Signed)
[facility] PROGRESS NOTE  Rhonda Davenport DGU:[id number] DOB: 1941/03/26 DOA: 03/24/2014 PCP: Lilian Coma., MD  Brief narrative: 73 year old pleasant female with multiple medical co-morbidities including but not limited to rheumatoid arthritis, anxiety and depression, hypertension, GERD who presented to [facility] ED 03/24/2014 with ongoing weakness and mental stats changes in past few months prior to this admission. Pt was seen in ED 4/12 for evaluation of fall and weakness but ultimately discharged home as her imaging studies were unremarkable.  In ED, vitals are stable with BP 129/90, HR 95-105 and Tmax 99.3 F. Oxygen saturation was 99 % on room air. Her UA did not show leukocytes on this admission but on 4/12 there were some leukocytes present. In addition, she had 1 episode of generalized seizure which has improved on its own. She did receive ativan 0.5 mg IV in ED but seizure stopped prior to receiving ativan. Neurology was consulted. EEG did not reveal acute seizure activity. MRI brain was unremarkable.   Assessment and Plan:   Principal Problem:  Acute encephalopathy, weakness, dehydration  - unclear etiology, could this be related to multiple pysch meds she is taking, new parkinsonian like disease, delirium, beginnings of dementia - psych meds were stopped per psych except for Zoloft (stopped Wellbutrin and amantadine) - ammonia level 65 on admission but LFT's are normal - she is also being treated for possible UTI but this regimen with rocephin did not seem to improve mental status - still requires PT evaluation for safe discharge plan - MRI unremarkable - EEG did not show seizure activity but per neuro they started her on Keppra for possible subclinical seizures.  Active Problems:  Hypertension  - May continue metoprolol  Seizure disorder  - As mentioned above, possible subclinical seizure  - on Keppra per neurology  - continue ativan 1 mg every 4 hours PRN IV  - neurology is  following  Rheumatoid arthritis  - Continue methotrexate  Hypothyroidism  - Continue Synthroid  History of depression  - Continue Zoloft. All other psych meds stopped - Psych consulted   Code Status: Full  Family Communication: family at the bedside  Disposition Plan: remains inpatient   Consultants:  Neurology  Psychiatry  Procedures:  EEG 03/25/2104 Antibiotics:  Rocephin 03/24/2104 -->  [name], MD  [facility] Pager [phone]  If 7PM-7AM, please contact night-coverage [email or web address] Password [facility] 03/27/2014, 6:46 AM   LOS: 3 days    HPI/Subjective: Overnight had an episode of possible seizure, she was spastic and this has improved with ativan.  Objective: Filed Vitals:   03/27/14 0000 03/27/14 0200 03/27/14 0400 03/27/14 0500  BP: 157/115 140/127 135/63   Pulse: 75 75 69   Temp: 97.9 F (36.6 C)  97.3 F (36.3 C)   TempSrc: Oral  Oral   Resp: 18 15 22    Height:      Weight:    49.4 kg (108 lb 14.5 oz)  SpO2: 99% 98% 98%     Intake/Output Summary (Last 24 hours) at 03/27/14 0646 Last data filed at 03/27/14 0500  Gross per 24 hour  Intake    960 ml  Output    200 ml  Net    760 ml    Exam:   General:  Pt is sleeping this am.  Cardiovascular: Regular rate and rhythm, S1/S2, no murmurs, no rubs, no gallops  Respiratory: Clear to auscultation bilaterally, no wheezing, no crackles, no rhonchi  Abdomen: Soft, non tender, non distended, bowel sounds present,  no guarding  Extremities: No edema, pulses DP and PT palpable bilaterally  Neuro: Grossly nonfocal  Data Reviewed: Basic Metabolic Panel:  Recent Labs Lab 03/21/14 1430 03/24/14 1351 03/24/14 1506 03/25/14 0308  NA 142 145  --  143  K 4.4 3.8  --  3.7  CL 104 108  --  107  CO2 22 22  --  18*  GLUCOSE 90 89  --  98  BUN 23 20  --  15  CREATININE 0.89 0.80  --  0.72  CALCIUM 9.6 9.1  --  8.1*  MG  --   --  2.1  --   PHOS  --   --  2.9  --    Liver Function  Tests:  Recent Labs Lab 03/24/14 1351  AST 18  ALT 14  ALKPHOS 88  BILITOT 0.3  PROT 6.9  ALBUMIN 3.7   No results found for this basename: LIPASE, AMYLASE,  in the last 168 hours  Recent Labs Lab 03/24/14 1352  AMMONIA 65*   CBC:  Recent Labs Lab 03/21/14 1430 03/24/14 1351 03/25/14 0308  WBC 10.3 6.7 8.3  NEUTROABS 8.0* 5.2  --   HGB 10.6* 10.1* 10.0*  HCT 33.5* 32.0* 31.8*  MCV 96.3 95.5 96.1  PLT 540* 410* 393   Cardiac Enzymes:  Recent Labs Lab 03/24/14 1506 03/24/14 2118 03/25/14 0305  TROPONINI <0.30 <0.30 <0.30   BNP: No components found with this basename: POCBNP,  CBG:  Recent Labs Lab 03/24/14 1355  GLUCAP 114*    Recent Results (from the past 240 hour(s))  URINE CULTURE     Status: None   Collection Time    03/24/14  3:00 PM      Result Value Ref Range Status   Specimen Description URINE, CATHETERIZED   Final   Special Requests NONE   Final   Culture  Setup Time     Final   Value: 03/25/2014 09:05     Performed at [facility] Count     Final   Value: NO GROWTH     Performed at [facility]   Culture     Final   Value: NO GROWTH     Performed at [facility]   Report Status 03/26/2014 FINAL   Final  MRSA PCR SCREENING     Status: None   Collection Time    03/24/14  4:28 PM      Result Value Ref Range Status   MRSA by PCR NEGATIVE  NEGATIVE Final   Comment:            The GeneXpert MRSA Assay (FDA     approved for NASAL specimens     only), is one component of a     comprehensive MRSA colonization     surveillance program. It is not     intended to diagnose MRSA     infection nor to guide or     monitor treatment for     MRSA infections.     Studies: Mr Brain Wo Contrast 03/26/2014   IMPRESSION: No acute intracranial abnormality. Stable MRI appearance of the brain since 2014.     Mr Cervical Spine Wo Contrast 03/26/2014    IMPRESSION: Exam discontinued prior to completion and motion  degraded, re-demonstrating advanced degenerative changes in the mid cervical spine, but without compelling evidence of cervical cord compression.   Electronically Signed   By: Lars Pinks M.D.   On:  03/26/2014 17:21   Dg Chest Port 1 View 03/25/2014     IMPRESSION: There is a metallic electronic cardiac monitoring device that projects over the lower thorax on the left which is likely a contraindication to MRI unless it is known to be MRI safe. The patient underwent cranial MRI in March of 2014 and had the prosthetic shoulder joints at that time. The electronic monitoring device was not present on a pre MRI chest x-ray dated December 19, 2012.      Scheduled Meds: . amantadine  100 mg Oral BID  . aspirin  81 mg Oral QHS  . cefTRIAXone (ROCEPHIN)  IV  1 g Intravenous Q24H  . enoxaparin (LOVENOX) injection  40 mg Subcutaneous Q24H  . fludrocortisone  0.1 mg Oral QPC breakfast  . levETIRAcetam  500 mg Intravenous Q12H  . levothyroxine  50 mcg Oral QAC breakfast  . [START ON 03/31/2014] methotrexate  15 mg Oral Weekly  . metoprolol tartrate  25 mg Oral BID  . pantoprazole  40 mg Oral Daily  . perphenazine  2 mg Oral QHS  . sertraline  100 mg Oral QHS   Continuous Infusions: . sodium chloride 75 mL/hr at 03/26/14 1235

## 2014-03-28 MED ORDER — IBUPROFEN 200 MG PO TABS
200.0000 mg | ORAL_TABLET | ORAL | Status: DC | PRN
  Administered 2014-03-28 – 2014-03-29 (×2): 200 mg via ORAL
  Filled 2014-03-28 (×2): qty 1

## 2014-03-28 NOTE — Progress Notes (Signed)
[facility] PROGRESS NOTE  Rhonda Davenport WCH:[id number] DOB: 04/17/1941 DOA: 03/24/2014 PCP: Lilian Coma., MD  Brief narrative: 73 year old pleasant female with multiple medical co-morbidities including but not limited to rheumatoid arthritis, anxiety and depression, hypertension, GERD who presented to [facility] ED 03/24/2014 with ongoing weakness and mental stats changes in past few months prior to this admission. Pt was seen in ED 4/12 for evaluation of fall and weakness but ultimately discharged home as her imaging studies were unremarkable.  In ED, vitals are stable with BP 129/90, HR 95-105 and Tmax 99.3 F. Oxygen saturation was 99 % on room air. Her UA did not show leukocytes on this admission but on 4/12 there were some leukocytes present. In addition, she had 1 episode of generalized seizure which has improved on its own. She did receive ativan 0.5 mg IV in ED but seizure stopped prior to receiving ativan. Neurology was consulted. EEG did not reveal acute seizure activity. MRI brain was unremarkable.   Assessment and Plan:   Principal Problem:  Acute encephalopathy, weakness, dehydration  - unclear etiology, could this be related to multiple pysch meds she is taking, new parkinsonian like disease, delirium, beginnings of dementia, LBD - I appreciate neurology recommendations, they think this is really not a seizure so Keppra stopped - we stopped now all psych meds except for Zoloft - continue to observe mental status - ammonia level 65 on admission but LFT's are normal  - she is also being treated for possible UTI but this regimen with rocephin did not seem to improve mental status  - MRI unremarkable  - EEG did not show seizure activity  Active Problems:  Hypertension  - May continue metoprolol  Rheumatoid arthritis  - Continue methotrexate  Hypothyroidism  - Continue Synthroid  History of depression  - Continue Zoloft. All other psych meds stopped  - Psych consulted   Code  Status: Full  Family Communication: family at the bedside  Disposition Plan: remains inpatient; transfer to med floor  Consultants:  Neurology  Psychiatry  Procedures:  EEG 03/25/2104 Antibiotics:  Rocephin 03/24/2104 -->   [name], MD  [facility] Pager [phone]  If 7PM-7AM, please contact night-coverage [email or web address] Password [email or web address] 03/28/2014, 6:45 AM   LOS: 4 days    HPI/Subjective: No overnight events.  Objective: Filed Vitals:   03/27/14 1800 03/27/14 2000 03/28/14 0000 03/28/14 0400  BP: 155/69 153/80 146/74 155/84  Pulse: 71 70 73 73  Temp:  98.5 F (36.9 C) 98.6 F (37 C) 98.6 F (37 C)  TempSrc:  Oral Oral Oral  Resp: 25 23 28 18   Height:      Weight:    50.6 kg (111 lb 8.8 oz)  SpO2: 99% 99% 100% 98%    Intake/Output Summary (Last 24 hours) at 03/28/14 0645 Last data filed at 03/28/14 0600  Gross per 24 hour  Intake   1655 ml  Output   1326 ml  Net    329 ml    Exam:   General:  Pt is sleeping, no acute distress  Cardiovascular: Regular rate and rhythm, S1/S2, no murmurs, no rubs, no gallops  Respiratory: Clear to auscultation bilaterally, no wheezing, no crackles, no rhonchi  Abdomen: Soft, non tender, non distended, bowel sounds present, no guarding  Extremities: No edema, pulses DP and PT palpable bilaterally  Neuro: Grossly nonfocal  Data Reviewed: Basic Metabolic Panel:  Recent Labs Lab 03/21/14 1430 03/24/14 1351 03/24/14 1506 03/25/14 0308  NA 142 145  --  143  K 4.4 3.8  --  3.7  CL 104 108  --  107  CO2 22 22  --  18*  GLUCOSE 90 89  --  98  BUN 23 20  --  15  CREATININE 0.89 0.80  --  0.72  CALCIUM 9.6 9.1  --  8.1*  MG  --   --  2.1  --   PHOS  --   --  2.9  --    Liver Function Tests:  Recent Labs Lab 03/24/14 1351  AST 18  ALT 14  ALKPHOS 88  BILITOT 0.3  PROT 6.9  ALBUMIN 3.7   No results found for this basename: LIPASE, AMYLASE,  in the last 168 hours  Recent Labs Lab 03/24/14 1352   AMMONIA 65*   CBC:  Recent Labs Lab 03/21/14 1430 03/24/14 1351 03/25/14 0308  WBC 10.3 6.7 8.3  NEUTROABS 8.0* 5.2  --   HGB 10.6* 10.1* 10.0*  HCT 33.5* 32.0* 31.8*  MCV 96.3 95.5 96.1  PLT 540* 410* 393   Cardiac Enzymes:  Recent Labs Lab 03/24/14 1506 03/24/14 2118 03/25/14 0305  TROPONINI <0.30 <0.30 <0.30   BNP: No components found with this basename: POCBNP,  CBG:  Recent Labs Lab 03/24/14 1355  GLUCAP 114*    Recent Results (from the past 240 hour(s))  URINE CULTURE     Status: None   Collection Time    03/24/14  3:00 PM      Result Value Ref Range Status   Specimen Description URINE, CATHETERIZED   Final   Special Requests NONE   Final   Culture  Setup Time     Final   Value: 03/25/2014 09:05     Performed at [facility] Count     Final   Value: NO GROWTH     Performed at [facility]   Culture     Final   Value: NO GROWTH     Performed at [facility]   Report Status 03/26/2014 FINAL   Final  MRSA PCR SCREENING     Status: None   Collection Time    03/24/14  4:28 PM      Result Value Ref Range Status   MRSA by PCR NEGATIVE  NEGATIVE Final   Comment:            The GeneXpert MRSA Assay (FDA     approved for NASAL specimens     only), is one component of a     comprehensive MRSA colonization     surveillance program. It is not     intended to diagnose MRSA     infection nor to guide or     monitor treatment for     MRSA infections.     Studies: Mr Brain Wo Contrast 03/26/2014    IMPRESSION: No acute intracranial abnormality. Stable MRI appearance of the brain since 2014.   Electronically Signed   By: Lars Pinks M.D.   On: 03/26/2014 16:40   Mr Cervical Spine Wo Contrast 03/26/2014  IMPRESSION: Exam discontinued prior to completion and motion degraded, re-demonstrating advanced degenerative changes in the mid cervical spine, but without compelling evidence of cervical cord compression.   Electronically  Signed   By: Lars Pinks M.D.   On: 03/26/2014 17:21    Scheduled Meds: . aspirin  81 mg Oral QHS  . cefTRIAXone (ROCEPHIN)  IV  1 g Intravenous Q24H  . enoxaparin (  LOVENOX) injection  40 mg Subcutaneous Q24H  . fludrocortisone  0.1 mg Oral QPC breakfast  . levothyroxine  50 mcg Oral QAC breakfast  . [START ON 03/31/2014] methotrexate  15 mg Oral Weekly  . metoprolol tartrate  25 mg Oral BID  . pantoprazole  40 mg Oral Daily  . sertraline  100 mg Oral QHS   Continuous Infusions: . sodium chloride 75 mL (03/27/14 2308)

## 2014-03-28 NOTE — Evaluation (Signed)
Physical Therapy Evaluation Patient Details Name: Rhonda Davenport MRN: [id number] DOB: [birth year] Today's Date: 03/28/2014   History of Present Illness  73 yo presented with increased weakness and AMS, acute encephalopathy, weakness and dehydration.   Clinical Impression  Pt presents with lots of general "all over pain per pt" due to falls and weakness with increasing inability to mobilize like she was able to before. To benefit from PT to continue to work with ambulation and mobility.     Follow Up Recommendations SNF    Equipment Recommendations  None recommended by PT    Recommendations for Other Services OT consult     Precautions / Restrictions Precautions Precautions: Fall (sister in law reports pt with increased falls especially in last few weeks) Precaution Comments: has been falling at home about a year, has been able to get up or else uses her life alert system Required Braces or Orthoses: Knee Immobilizer - Left (per sister in law was placed by orthopedic on LLE for fracture in tibia (?) ) Knee Immobilizer - Left:  (unsure of orders for knee brace. ) Other Brace/Splint: left knee hinged brace Restrictions Weight Bearing Restrictions:  (per sister in law asn pt , no weight bearing restrictions were placed on LLE a week ago. )      Mobility  Bed Mobility Overal bed mobility: Needs Assistance;+2 for physical assistance Bed Mobility: Supine to Sit     Supine to sit: Mod assist     General bed mobility comments: very guraded and leaning posteriorly initially and needed assisstance to scoot hips and legs off bed. Very fearful.  Transfers Overall transfer level: Needs assistance Equipment used: Rolling walker (2 wheeled) Transfers: Sit to/from Stand Sit to Stand: Mod assist;+2 physical assistance   Squat pivot transfers: +2 physical assistance;Total assist     General transfer comment: tried sit to stand with pt complaining on L foot pain with weight bearing and  continued to walk feet forward with retropulsion. Stand was not very successful, however pt still wanted to get in the chair so squat pivot with assist from front with total lift due to pt continued with retropulsion and walking feet forwad  Ambulation/Gait                Stairs            Wheelchair Mobility    Modified Rankin (Stroke Patients Only)       Balance                                             Pertinent Vitals/Pain C/o of L ankle pain in lateral area. No noted swelling or redness however hurt with minimal DF and palpation.     Home Living Family/patient expects to be discharged to:: Skilled nursing facility (pt just prefers for it to be called a rehab facility) Living Arrangements: Alone   Type of Home: House Home Access: Level entry     Home Layout: One [place]: [place] - 2 wheels;Cane - single point      Prior Function Level of Independence: Independent with assistive device(s)         Comments: just last week family started caregiver to come into teh home 4 hours per day to assist the patient due to increase falls, and patient not doing a well at home. About 2 weeks ago, pt  was able to ambulate with no RW however gait was shuffled and not really fuctional .      Hand Dominance        Extremity/Trunk Assessment                 RLE Deficits / Details: AROM WFL except ankle DF about -10 , strength grossly 4/5 LLE Deficits / Details: AROM WFL except ankle DF about -10 and knee flexion limited to about 95 with knee brace under pants , strength grossly 4/5; severe valgus deformity with ankle inversion  Cervical / Trunk Assessment: Kyphotic  Communication   Communication: No difficulties (however very flat affect and limited talking to one word responses)  Cognition Arousal/Alertness: Awake/alert Behavior During Therapy: WFL for tasks assessed/performed Overall Cognitive Status: Within Functional  Limits for tasks assessed                      General Comments      Exercises        Assessment/Plan    PT Assessment Patient needs continued PT services  PT Diagnosis Difficulty walking;Generalized weakness   PT Problem List Decreased strength;Decreased activity tolerance;Decreased balance;Decreased mobility;Decreased safety awareness;Decreased knowledge of use of DME  PT Treatment Interventions DME instruction;Gait training;Functional mobility training;Therapeutic activities;Therapeutic exercise;Patient/family education   PT Goals (Current goals can be found in the Care Plan section) Acute Rehab PT Goals Patient Stated Goal: pt would liek to return home when able, but also has a goal to walk again PT Goal Formulation: With patient Time For Goal Achievement: 04/11/14 Potential to Achieve Goals: Fair    Frequency Min 3X/week   Barriers to discharge        Co-evaluation               End of Session Equipment Utilized During Treatment: Gait belt;Other (comment) Activity Tolerance: Patient limited by pain Patient left: in chair;with family/visitor present Nurse Communication: Mobility status (need for squat pivot from front with 2 assist and/or the use of a lift. )         Time: 3664-4034 PT Time Calculation (min): 45 min   Charges:   PT Evaluation $Initial PT Evaluation Tier I: 1 Procedure PT Treatments $Therapeutic Activity: 23-37 mins   PT G CodesClide Dales 2014-03-31, 5:29 PM Clide Dales, PT Pager: [phone] 2014/03/31

## 2014-03-29 MED ORDER — LORAZEPAM 1 MG PO TABS: 1.0000 mg | ORAL_TABLET | Freq: Two times a day (BID) | ORAL | Status: DC | PRN

## 2014-03-29 MED ORDER — ZOLPIDEM TARTRATE 5 MG PO TABS: 5.0000 mg | ORAL_TABLET | Freq: Every evening | ORAL | Status: DC | PRN

## 2014-03-29 MED ORDER — IBUPROFEN 200 MG PO TABS
200.0000 mg | ORAL_TABLET | ORAL | Status: DC | PRN
Start: 2014-03-29 — End: 2015-01-05

## 2014-03-29 MED ORDER — SERTRALINE HCL 100 MG PO TABS: 100.0000 mg | ORAL_TABLET | Freq: Every day | ORAL | Status: DC

## 2014-03-29 MED ORDER — METOPROLOL TARTRATE 25 MG PO TABS: 25.0000 mg | ORAL_TABLET | Freq: Two times a day (BID) | ORAL | Status: DC

## 2014-03-29 NOTE — Progress Notes (Signed)
Discharge instructions accompanied pt, left the unit in stable condition via ambulance to SNF.

## 2014-03-29 NOTE — Progress Notes (Signed)
Clinical Social Work Department CLINICAL SOCIAL WORK PSYCHIATRY SERVICE LINE ASSESSMENT 03/29/2014  Patient:  Rhonda Davenport  Account:  [id number]  Admit Date:  03/24/2014  Clinical Social Worker:  Sindy Messing, LCSW  Date/Time:  03/29/2014 10:15 AM Referred by:  Physician  Date referred:  03/29/2014 Reason for Referral  Psychosocial assessment   Presenting Symptoms/Problems (In the person's/family's own words):   Psych consulted for medication management.   Abuse/Neglect/Trauma History (check all that apply)  Denies history   Abuse/Neglect/Trauma Comments:   Psychiatric History (check all that apply)  Outpatient treatment   Psychiatric medications:  Zoloft 100 mg  Ativan 1 mg   Current Mental Health Hospitalizations/Previous Mental Health History:   Patient has been diagnosed with depression and anxiety. Patient's depression increased in 2006 after her husband passed away. Patient currently receives medication management.   Current provider:   Thayer Headings   Place and Date:   [facility], [place]   Current Medications:   Scheduled Meds:      . aspirin  81 mg Oral QHS  . cefTRIAXone (ROCEPHIN)  IV  1 g Intravenous Q24H  . enoxaparin (LOVENOX) injection  40 mg Subcutaneous Q24H  . fludrocortisone  0.1 mg Oral QPC breakfast  . levothyroxine  50 mcg Oral QAC breakfast  . [START ON 03/31/2014] methotrexate  15 mg Oral Weekly  . metoprolol tartrate  25 mg Oral BID  . pantoprazole  40 mg Oral Daily  . sertraline  100 mg Oral QHS        Continuous Infusions:      . sodium chloride 75 mL/hr at 03/29/14 0723          PRN Meds:.ibuprofen, LORazepam, ondansetron (ZOFRAN) IV, ondansetron, polyvinyl alcohol       Previous Impatient Admission/Date/Reason:   None reported   Emotional Health / Current Symptoms    Suicide/Self Harm  None reported   Suicide attempt in the past:   Patient sleeping and did not awake but patient's family report no previous attempts.   Other harmful  behavior:   None reported   Psychotic/Dissociative Symptoms  Auditory Hallucinations  Visual Hallucinations   Other Psychotic/Dissociative Symptoms:   Family reports that patient was doing well over the weekend and mental status was improving but this morning she was talking about calling family members that have been passed away for several years, saying that they came to visit her, and thinking there were bugs on her arm.    Attention/Behavioral Symptoms  Unable to accurately assess   Other Attention / Behavioral Symptoms:   Patient sleeping and did not participate in assessment.    Cognitive Impairment  Unable to accurately assess   Other Cognitive Impairment:   Patient sleeping and did not participate in assessment.    Mood and Adjustment  Unable to accurately assess    Stress, Anxiety, Trauma, Any Recent Loss/Stressor  Anxiety   Anxiety (frequency):   Family reports that patient has dealt with anxiety for several years. Patient uses medication in order to assist with symptoms.   Phobia (specify):   N/A   Compulsive behavior (specify):   N/A   Obsessive behavior (specify):   N/A   Other:   N/A   Substance Abuse/Use  Current substance use   SBIRT completed (please refer for detailed history):  N  Self-reported substance use:   Family reports that patient often uses several different medications such as Oxycodone and Xanax. Family reports that patient might be withdrawing due to recent  medication changes. Family reports no other substance use.   Urinary Drug Screen Completed:  N Alcohol level:   N/A    Environmental/Housing/Living Arrangement  Stable housing   Who is in the home:   Alone   Emergency contact:  [place]  Medicare   Patient's Strengths and Goals (patient's own words):   Patient has supportive family who is assisting with SNF placement at DC to ensure patient has safe environment.   Clinical Social Worker's  Interpretive Summary:   CSW received referral in order to complete psychosocial assessment. CSW reviewed chart and met with patient, niece, and sister-in-law at bedside. CSW introduced myself and explained role.    Patient sleeping in chair and did not wake up when CSW arrived. Family reports that patient lives at home by herself but has had some help over the past few days. Niece lives in nearby town but patient's brother and sister-in-law live a couple of hours away. Patient has had a steady decline over the past 6 months and family feels SNF placement is needed. Patient and family is agreeable to SNF placement at [facility] when patient is medically stable.    Family is concerned about patient's wellbeing and her mental status. Patient was confused when admitted and had a seizure. Family reports that patient was improving over the weekend and able to communicate effectively with them. Family returned this morning and patient reported that she was speaking to family members that have been dead for several years. Patient also felt that bugs were around her IV site. Family reports that patient has been shaking and inquired about medications. Patient manages her own medications at home and family reports they are unaware of how compliant she is with medications. Family reports they will talk with MD in order to determine if patient is possibly withdrawing from recent medication changes.    Per psych MD note, patient does not need inpatient hospitalization and medication recommendations were made. CSW will continue to follow and will speak with patient when she is more alert.   Disposition:  Recommend Psych CSW continuing to support while in hospital   [place], [place] [phone]

## 2014-03-29 NOTE — Discharge Instructions (Signed)
Altered Mental Status °Altered mental status most often refers to an abnormal change in your responsiveness and awareness. It can affect your speech, thought, mobility, memory, attention span, or alertness. It can range from slight confusion to complete unresponsiveness (coma). Altered mental status can be a sign of a serious underlying medical condition. Rapid evaluation and medical treatment is necessary for patients having an altered mental status. °CAUSES  °· Low blood sugar (hypoglycemia) or diabetes. °· Severe loss of body fluids (dehydration) or a body salt (electrolyte) imbalance. °· A stroke or other neurologic problem, such as dementia or delirium. °· A head injury or tumor. °· A drug or alcohol overdose. °· Exposure to toxins or poisons. °· Depression, anxiety, and stress. °· A low oxygen level (hypoxia). °· An infection. °· Blood loss. °· Twitching or shaking (seizure). °· Heart problems, such as heart attack or heart rhythm problems (arrhythmias). °· A body temperature that is too low or too high (hypothermia or hyperthermia). °DIAGNOSIS  °A diagnosis is based on your history, symptoms, physical and neurologic examinations, and diagnostic tests. Diagnostic tests may include: °· Measurement of your blood pressure, pulse, breathing, and oxygen levels (vital signs). °· Blood tests. °· Urine tests. °· X-ray exams. °· A computerized magnetic scan (magnetic resonance imaging, MRI). °· A computerized X-ray scan (computed tomography, CT scan). °TREATMENT  °Treatment will depend on the cause. Treatment may include: °· Management of an underlying medical or mental health condition. °· Critical care or support in the hospital. °HOME CARE INSTRUCTIONS  °· Only take over-the-counter or prescription medicines for pain, discomfort, or fever as directed by your caregiver. °· Manage underlying conditions as directed by your caregiver. °· Eat a healthy, well-balanced diet to maintain strength. °· Join a support group or  prevention program to cope with the condition or trauma that caused the altered mental status. Ask your caregiver to help choose a program that works for you. °· Follow up with your caregiver for further examination, therapy, or testing as directed. °SEEK MEDICAL CARE IF:  °· You feel unwell or have chills. °· You or your family notice a change in your behavior or your alertness. °· You have trouble following your caregiver's treatment plan. °· You have questions or concerns. °SEEK IMMEDIATE MEDICAL CARE IF:  °· You have a rapid heartbeat or have chest pain. °· You have difficulty breathing. °· You have a fever. °· You have a headache with a stiff neck. °· You cough up blood. °· You have blood in your urine or stool. °· You have severe agitation or confusion. °MAKE SURE YOU:  °· Understand these instructions. °· Will watch your condition. °· Will get help right away if you are not doing well or get worse. °Document Released: 05/16/2010 Document Revised: 02/18/2012 Document Reviewed: 05/16/2010 °[employer]® Patient Information ©2014 [employer]. ° °

## 2014-03-29 NOTE — Progress Notes (Signed)
Clinical Social Work  CSW faxed DC summary to [facility] who is agreeable to accept patient today. CSW prepared DC packet with FL2 and hard scripts included. CSW informed patient and family of DC and all parties agreeable. Patient reports that PT was difficult today but family encouraged patient to continue to work with therapy in order to regain strength. Patient reports she is agreeable to [facility] and for family to be involved with treatment. RN to call report to SNF. CSW coordinated PTAR per patient and family request. PTAR request #: [phone].  CSW is signing off but available if needed.  [place], [place] [phone]

## 2014-03-29 NOTE — Discharge Summary (Signed)
Physician Discharge Summary  Rhonda Davenport GYI:[id number] DOB: 1941-01-14 DOA: 03/24/2014  PCP: Lilian Coma., MD  Admit date: 03/24/2014 Discharge date: 03/29/2014  Recommendations for Outpatient Follow-up:  1. Please note that we have stopped all the psychiatric medications except for Zoloft. Patient can also continue Ativan 1 mg at bedtime for sleep or anxiety.  2. We started metoprolol 25 mg twice daily for blood pressure control. Blood pressure is stable and heart rate is stable while on this medication in hospital.  3. CT head and MRI of the brain were all unremarkable. Those studies were done on this admission.  4. EEG did not show seizure activity.  5. Patient has received Rocephin for 5 days for treatment of UTI. Antibiotics are not required at the time of discharge.   Discharge Diagnoses:  Active Problems:   Seizure   Altered mental state    Discharge Condition: stable   Diet recommendation: as tolerated   History of present illness:  73 year old pleasant female with multiple medical co-morbidities including but not limited to rheumatoid arthritis, anxiety and depression, hypertension, GERD who presented to [facility] ED 03/24/2014 with ongoing weakness and mental stats changes in past few months prior to this admission. Pt was seen in ED 4/12 for evaluation of fall and weakness but ultimately discharged home as her imaging studies were unremarkable.  In ED, vitals are stable with BP 129/90, HR 95-105 and Tmax 99.3 F. Oxygen saturation was 99 % on room air. Her UA did not show leukocytes on this admission but on 4/12 there were some leukocytes present. In addition, she had 1 episode of generalized seizure which has improved on its own. She did receive ativan 0.5 mg IV in ED but seizure stopped prior to receiving ativan. Neurology was consulted. EEG did not reveal acute seizure activity. MRI brain was unremarkable.   Assessment and Plan:   Principal Problem:  Acute encephalopathy,  weakness, dehydration  - unclear etiology, could this be related to multiple pysch meds she is taking, new parkinsonian like disease, delirium, beginnings of dementia, LBD  - I appreciate neurology recommendations, they think this is really not a seizure so Keppra stopped  - we stopped now all psych meds except for Zoloft  - ammonia level 65 on admission but LFT's are normal  - she is also being treated for possible UTI but this regimen with rocephin did not seem to improve mental status; rocephin will be stopped today prior to discharge  - MRI unremarkable  - EEG did not show seizure activity  Active Problems:  Hypertension  - May continue metoprolol  Rheumatoid arthritis  - Continue methotrexate  Hypothyroidism  - Continue Synthroid  History of depression  - Continue Zoloft. All other psych meds stopped  - Psych consulted on this admission and was fine to stop all psych meds except for zoloft  Code Status: Full  Family Communication: family at the bedside   Consultants:  Neurology  Psychiatry  Procedures:  EEG 03/25/2104 Antibiotics:  Rocephin 03/24/2104 --> 03/29/2014   Signed:  [name], MD  [facility] 03/29/2014, 12:06 PM  Pager #: [phone]   Discharge Exam: Filed Vitals:   03/29/14 1121  BP: 138/75  Pulse: 80  Temp:   Resp:    Filed Vitals:   03/28/14 2135 03/29/14 0545 03/29/14 1044 03/29/14 1121  BP: 157/84 149/82 138/75 138/75  Pulse: 70 76 80 80  Temp: 98.8 F (37.1 C) 98.8 F (37.1 C) 99.6 F (37.6 C)  TempSrc: Oral Oral Oral   Resp: 24 22 24    Height:      Weight:      SpO2: 95% 94% 96%     General: Pt is not in acute distress Cardiovascular: Regular rate and rhythm, S1/S2 appreciated Respiratory: Clear to auscultation bilaterally, no wheezing, no crackles, no rhonchi Abdominal: Soft, non tender, non distended, bowel sounds +, no guarding Extremities: no edema, no cyanosis, pulses palpable bilaterally DP and PT Neuro:  Grossly nonfocal  Discharge Instructions  Discharge Orders   Future Orders Complete By Expires   Diet - low sodium heart healthy  As directed    Discharge instructions  As directed    Increase activity slowly  As directed        Medication List    STOP taking these medications       amantadine 100 MG capsule  Commonly known as:  SYMMETREL     buPROPion 150 MG 24 hr tablet  Commonly known as:  WELLBUTRIN XL     clonazePAM 1 MG tablet  Commonly known as:  KLONOPIN     HYDROcodone-acetaminophen 5-325 MG per tablet  Commonly known as:  NORCO/VICODIN     meclizine 12.5 MG tablet  Commonly known as:  ANTIVERT     perphenazine 2 MG tablet  Commonly known as:  TRILAFON      TAKE these medications       aspirin 81 MG tablet  Take 81 mg by mouth at bedtime.     ergocalciferol 50000 UNITS capsule  Commonly known as:  VITAMIN D2  Take 50,000 Units by mouth once a week. Wednesdays. For 12 weeks, ends on 04/21/14.     fludrocortisone 0.1 MG tablet  Commonly known as:  FLORINEF  Take 0.1 mg by mouth daily after breakfast.     ibuprofen 200 MG tablet  Commonly known as:  ADVIL,MOTRIN  Take 1 tablet (200 mg total) by mouth every 4 (four) hours as needed for fever, headache, mild pain or cramping.     levothyroxine 50 MCG tablet  Commonly known as:  SYNTHROID, LEVOTHROID  Take 50 mcg by mouth daily before breakfast.     LORazepam 1 MG tablet  Commonly known as:  ATIVAN  Take 1 tablet (1 mg total) by mouth 3 times/day as needed-between meals & bedtime for anxiety or sleep.     methotrexate 2.5 MG tablet  Commonly known as:  RHEUMATREX  - Take 15 mg by mouth once a week. Caution:Chemotherapy. Protect from light.  - Takes on Wednesday's     metoprolol tartrate 25 MG tablet  Commonly known as:  LOPRESSOR  Take 1 tablet (25 mg total) by mouth 2 (two) times daily.     omeprazole 40 MG capsule  Commonly known as:  PRILOSEC  Take 1 capsule (40 mg total) by mouth daily.      polyvinyl alcohol 1.4 % ophthalmic solution  Commonly known as:  LIQUIFILM TEARS  Place 1 drop into both eyes 3 (three) times daily as needed for dry eyes.     sertraline 100 MG tablet  Commonly known as:  ZOLOFT  Take 1 tablet (100 mg total) by mouth at bedtime.     zolpidem 5 MG tablet  Commonly known as:  AMBIEN  Take 1 tablet (5 mg total) by mouth at bedtime as needed for sleep.           Follow-up Information   Follow up with JOBE,DANIEL B., MD. Schedule an appointment  as soon as possible for a visit in 2 weeks. (As needed)    Specialty:  Internal Medicine   Contact information:   [address] [place] [address] [phone]        The results of significant diagnostics from this hospitalization (including imaging, microbiology, ancillary and laboratory) are listed below for reference.    Significant Diagnostic Studies: Dg Hip Complete Left 03/24/2014    IMPRESSION: No acute fracture identified.   Dg Tibia/fibula Right 03/24/2014   IMPRESSION: No fracture or dislocation is seen.    Ct Head Wo Contrast 03/24/2014    IMPRESSION: Severe degenerative changes in the cervical spine without acute traumatic injury.  Chronic intracranial involutional change. Mild to moderate inflammatory change in the maxillary sinus on the left     Ct Head Wo Contrast 03/21/2014    IMPRESSION: Mild diffuse cortical atrophy. Mild chronic ischemic white matter disease. No acute intracranial abnormality seen.  Extensive degenerative changes as described above. No acute abnormality seen in the cervical spine.     Ct Cervical Spine Wo Contrast 03/24/2014  IMPRESSION: Severe degenerative changes in the cervical spine without acute traumatic injury.  Chronic intracranial involutional change. Mild to moderate inflammatory change in the maxillary sinus on the left   Electronically Signed   By: Skipper Cliche M.D.   On: 03/24/2014 15:45   Ct Cervical Spine Wo Contrast 03/21/2014   IMPRESSION: Mild  diffuse cortical atrophy. Mild chronic ischemic white matter disease. No acute intracranial abnormality seen.  Extensive degenerative changes as described above. No acute abnormality seen in the cervical spine.     Mr Brain Wo Contrast 03/26/2014     IMPRESSION: No acute intracranial abnormality. Stable MRI appearance of the brain since 2014.   In  Mr Cervical Spine Wo Contrast 03/26/2014  IMPRESSION: Exam discontinued prior to completion and motion degraded, re-demonstrating advanced degenerative changes in the mid cervical spine, but without compelling evidence of cervical cord compression.   Dg Chest Port 1 View 03/25/2014  IMPRESSION: There is a metallic electronic cardiac monitoring device that projects over the lower thorax on the left which is likely a contraindication to MRI unless it is known to be MRI safe. The patient underwent cranial MRI in March of 2014 and had the prosthetic shoulder joints at that time. The electronic monitoring device was not present on a pre MRI chest x-ray dated December 19, 2012.     Microbiology: URINE CULTURE     Status: None   Collection Time    03/24/14  3:00 PM      Result Value Ref Range Status   Specimen Description URINE, CATHETERIZED   Final   Value: NO GROWTH     Performed at [facility]   Report Status 03/26/2014 FINAL   Final  MRSA PCR SCREENING     Status: None   Collection Time    03/24/14  4:28 PM      Result Value Ref Range Status   MRSA by PCR NEGATIVE  NEGATIVE Final     Labs: Basic Metabolic Panel:  Recent Labs Lab 03/24/14 1351 03/24/14 1506 03/25/14 0308  NA 145  --  143  K 3.8  --  3.7  CL 108  --  107  CO2 22  --  18*  GLUCOSE 89  --  98  BUN 20  --  15  CREATININE 0.80  --  0.72  CALCIUM 9.1  --  8.1*  MG  --  2.1  --  PHOS  --  2.9  --    Liver Function Tests:  Recent Labs Lab 03/24/14 1351  AST 18  ALT 14  ALKPHOS 88  BILITOT 0.3  PROT 6.9  ALBUMIN 3.7   No results found for this basename:  LIPASE, AMYLASE,  in the last 168 hours  Recent Labs Lab 03/24/14 1352  AMMONIA 65*   CBC:  Recent Labs Lab 03/24/14 1351 03/25/14 0308  WBC 6.7 8.3  NEUTROABS 5.2  --   HGB 10.1* 10.0*  HCT 32.0* 31.8*  MCV 95.5 96.1  PLT 410* 393   Cardiac Enzymes:  Recent Labs Lab 03/24/14 1506 03/24/14 2118 03/25/14 0305  TROPONINI <0.30 <0.30 <0.30   BNP: BNP (last 3 results) No results found for this basename: PROBNP,  in the last 8760 hours CBG:  Recent Labs Lab 03/24/14 1355  GLUCAP 114*    Time coordinating discharge: Over 30 minutes

## 2014-03-29 NOTE — Progress Notes (Signed)
While eating breakfast pt. stated she had plastic in her mouth and pulled out about 50 pieces. Also she said her food tasted like it had chemicals in it. She only ate about 10% of her breakfast.

## 2014-03-29 NOTE — Progress Notes (Signed)
Physical Therapy Treatment Patient Details Name: Rhonda Davenport MRN: [id number] DOB: [birth year] Today's Date: 03/29/2014    History of Present Illness 73 yo presented with increased weakness and AMS, acute encephalopathy, weakness and dehydration.     PT Comments    Assisted pt to EOB required + 2 assist and increased time.  Severe posterior lean and B LE extension.  Tactile cueing to correct to midline and VC's to decrease anxiety/fear of falling.  Performed static and dynamic sitting balance  activities while EOB x 5 min.  Assisted with sit to stand x 5 resp with lateral hip support.  Attempted amb however too unsteady as pt demon increased fear and increased tremors throughout.  Positioned in recliner.    Follow Up Recommendations  SNF [facility])     Equipment Recommendations  None recommended by PT    Recommendations for Other Services       Precautions / Restrictions Precautions Precautions: Fall Precaution Comments: has been falling at home about a year, has been able to get up or else uses her life alert system Required Braces or Orthoses: Other Brace/Splint (L knee hinge brace)    Mobility  Bed Mobility Overal bed mobility: Needs Assistance;+2 for physical assistance Bed Mobility: Supine to Sit;Sit to Supine     Supine to sit: Max assist Sit to supine: Max assist   General bed mobility comments: Pt able to partially rise and use rails to assist self from supine to sit but had to use bed pad to complete scooting to EOB and to HOB.  Poor sitting balance once EOB.  Severe posterior lean and MAX fear of falling.   Transfers Overall transfer level: Needs assistance Equipment used: Rolling walker (2 wheeled) Transfers: Sit to/from Stand Sit to Stand: Mod assist;+2 physical assistance;Max assist   Squat pivot transfers: +2 physical assistance;Total assist     General transfer comment: severe posterior lean/push with MAX anxiety/fear of falling.  Performed sit to  stand x 10 reps self assist with tactile support B hips to increase trunk extension.  Also pt demon B LE extension with sit to stand and requires cueing to flex H hips/knees.    Ambulation/Gait Ambulation/Gait assistance: +2 physical assistance;+2 safety/equipment;Total assist Ambulation Distance (Feet): 1 Feet Assistive device: Rolling walker (2 wheeled)       General Gait Details: attempted however unavble to functionally weight shift and unable to center self to midline as pt demon severe posterior lean/push.  Max anxiety/fear of falling.  tremors throughout.     Stairs            Wheelchair Mobility    Modified Rankin (Stroke Patients Only)       Balance                                    Cognition                            Exercises      General Comments        Pertinent Vitals/Pain     Home Living                      Prior Function            PT Goals (current goals can now be found in the care plan section) Progress towards PT goals:  Progressing toward goals    Frequency  Min 3X/week    PT Plan      Co-evaluation             End of Session Equipment Utilized During Treatment: Gait belt Activity Tolerance: Patient limited by fatigue;Treatment limited secondary to medical complications (Comment) Patient left: in bed;with call bell/phone within reach;with family/visitor present     Time: 8546-2703 PT Time Calculation (min): 26 min  Charges:  $Gait Training: 8-22 mins $Therapeutic Activity: 8-22 mins                    G Codes:      Rica Koyanagi  PTA [facility]  Acute  Rehab Pager      [phone]

## 2014-05-06 DIAGNOSIS — R030 Elevated blood-pressure reading, without diagnosis of hypertension: Secondary | ICD-10-CM

## 2014-05-06 DIAGNOSIS — M069 Rheumatoid arthritis, unspecified: Secondary | ICD-10-CM | POA: Insufficient documentation

## 2014-05-06 DIAGNOSIS — IMO0001 Reserved for inherently not codable concepts without codable children: Secondary | ICD-10-CM | POA: Insufficient documentation

## 2014-06-07 DIAGNOSIS — M5136 Other intervertebral disc degeneration, lumbar region: Secondary | ICD-10-CM | POA: Insufficient documentation

## 2014-06-07 DIAGNOSIS — M5416 Radiculopathy, lumbar region: Secondary | ICD-10-CM | POA: Insufficient documentation

## 2014-08-02 DIAGNOSIS — Z96659 Presence of unspecified artificial knee joint: Secondary | ICD-10-CM | POA: Insufficient documentation

## 2014-12-08 ENCOUNTER — Other Ambulatory Visit: Payer: Self-pay | Admitting: Internal Medicine

## 2014-12-08 DIAGNOSIS — M5489 Other dorsalgia: Secondary | ICD-10-CM

## 2014-12-08 DIAGNOSIS — R270 Ataxia, unspecified: Secondary | ICD-10-CM

## 2014-12-09 ENCOUNTER — Other Ambulatory Visit: Payer: Self-pay | Admitting: Internal Medicine

## 2014-12-09 DIAGNOSIS — R270 Ataxia, unspecified: Secondary | ICD-10-CM

## 2014-12-15 ENCOUNTER — Ambulatory Visit
Admission: RE | Admit: 2014-12-15 | Discharge: 2014-12-15 | Disposition: A | Discharge status: Home / Self Care | Payer: Medicare Other | Source: Ambulatory Visit | Attending: Internal Medicine | Admitting: Internal Medicine

## 2014-12-15 DIAGNOSIS — R270 Ataxia, unspecified: Secondary | ICD-10-CM

## 2014-12-27 ENCOUNTER — Ambulatory Visit: Payer: PRIVATE HEALTH INSURANCE | Admitting: Neurology

## 2014-12-30 ENCOUNTER — Ambulatory Visit: Payer: Medicare Other | Admitting: Neurology

## 2015-01-05 ENCOUNTER — Ambulatory Visit (INDEPENDENT_AMBULATORY_CARE_PROVIDER_SITE_OTHER): Payer: Medicare Other | Admitting: Neurology

## 2015-01-05 ENCOUNTER — Encounter: Payer: Self-pay | Admitting: Neurology

## 2015-01-05 VITALS — BP 163/85 | HR 77 | Ht 59.0 in | Wt 118.8 lb

## 2015-01-05 DIAGNOSIS — R269 Unspecified abnormalities of gait and mobility: Secondary | ICD-10-CM

## 2015-01-05 DIAGNOSIS — R29898 Other symptoms and signs involving the musculoskeletal system: Secondary | ICD-10-CM

## 2015-01-05 DIAGNOSIS — E538 Deficiency of other specified B group vitamins: Secondary | ICD-10-CM

## 2015-01-05 HISTORY — DX: Unspecified abnormalities of gait and mobility: R26.9

## 2015-01-05 NOTE — Progress Notes (Signed)
Reason for visit: Gait disorder  Rhonda Davenport is a 74 y.o. female  History of present illness:  Rhonda Davenport is a 75 year old right-handed white female with a history of problems with a gait disorder that she claims as been present for about one year. The patient had a left total knee replacement she claims in August 2015, and following the surgery, her walking significantly worsened. The patient has been using a walker for about one year. She continues to have falls frequently, and she fell yesterday twice, once today. The falls generally will occur when she is not using a walker. She does not fall while she is using a walker. She indicates that sometimes she will fall backwards, and sometimes she will collapse with the legs. The patient reports back pain, and some pain radiating down the left leg. She feels weak in both legs, and she will collapse at the knees. She denies any numbness of the extremities. She also complains of some neck discomfort. She has undergone MRI evaluation of the brain in April 2015 showing a moderate level of small vessel ischemic changes. The patient underwent MRI evaluation of the cervical spine, but this study had a significant amount of movement artifact associated with it. The patient indicates that she had a CT scan evaluation of the low back at [facility], she does not know the results of the study. She has had bilateral shoulder surgeries. She denies any numbness in the feet. She comes to the office today for an evaluation. She is being treated for Still's disease.  Past Medical History  Diagnosis Date  . Hypertension   . High cholesterol   . Still's disease   . GERD (gastroesophageal reflux disease)   . Anemia   . Thyroid disease     Hypothyroidism  . H/O blood clots     pt states had blood clot in her rt arm  . Fall   . 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 Chronic low back pain     Past Surgical History  Procedure Laterality Date  . Joint  replacement      TKR, left  . Abdominal hysterectomy    . Appendectomy    . Shoulder surgery      Bilateral  . Carpal tunnel release Bilateral   . Tonsillectomy      Family History  Problem Relation Age of Onset  . Rheum arthritis Mother   . Rheum arthritis Father   . Healthy Brother     Social history:  reports that she quit smoking about 32 years ago. Her smoking use included Cigarettes. She has a 26 pack-year smoking history. She has never used smokeless tobacco. She reports that she does not drink alcohol or use illicit drugs.  Medications:  Current Outpatient Prescriptions on File Prior to Visit  Medication Sig Dispense Refill  . aspirin 81 MG tablet Take 81 mg by mouth at bedtime.    . ergocalciferol (VITAMIN D2) 50000 UNITS capsule Take 50,000 Units by mouth once a week. Wednesdays. For 12 weeks, ends on 04/21/14.    . fludrocortisone (FLORINEF) 0.1 MG tablet Take 0.1 mg by mouth daily after breakfast.    . levothyroxine (SYNTHROID, LEVOTHROID) 50 MCG tablet Take 50 mcg by mouth daily before breakfast.     . LORazepam (ATIVAN) 1 MG tablet Take 1 tablet (1 mg total) by mouth 3 times/day as needed-between meals & bedtime for anxiety or sleep. 30 tablet 0  . methotrexate (RHEUMATREX) 2.5  MG tablet Take 15 mg by mouth once a week. Caution:Chemotherapy. Protect from light. Takes on Wednesday's    . metoprolol tartrate (LOPRESSOR) 25 MG tablet Take 1 tablet (25 mg total) by mouth 2 (two) times daily. 60 tablet 0  . omeprazole (PRILOSEC) 40 MG capsule Take 1 capsule (40 mg total) by mouth daily. 30 capsule 2  . polyvinyl alcohol (LIQUIFILM TEARS) 1.4 % ophthalmic solution Place 1 drop into both eyes 3 (three) times daily as needed for dry eyes.    Marland Kitchen zolpidem (AMBIEN) 5 MG tablet Take 1 tablet (5 mg total) by mouth at bedtime as needed for sleep. 30 tablet 0   No current facility-administered medications on file prior to visit.      Allergies  Allergen Reactions  . Codeine  Palpitations    ROS:  Out of a complete 14 system review of symptoms, the patient complains only of the following symptoms, and all other reviewed systems are negative.  Fatigue Easy bruising, easy bleeding Joint pain, achy muscles Runny nose Headache, weakness Depression, anxiety, decreased energy, suicidal thoughts  Blood pressure 163/85, pulse 77, height 4\' 11"  (1.499 m), weight 118 lb 12.8 oz (53.887 kg).  Physical Exam  General: The patient is alert and cooperative at the time of the examination.  Eyes: Pupils are equal, round, and reactive to light. Discs are flat bilaterally.  Neck: The neck is supple, no carotid bruits are on the right, a carotid bruits noted on the left.  Respiratory: The respiratory examination is clear anteriorly, some occasional posterior wheezes.  Cardiovascular: The cardiovascular examination reveals a regular rate and rhythm, no obvious murmurs or rubs are noted.  Skin: Extremities are without significant edema.  Neurologic Exam  Mental status: The patient is alert and oriented x 3 at the time of the examination. The patient has apparent normal recent and remote memory, with an apparently normal attention span and concentration ability.  Cranial nerves: Facial symmetry is present. There is good sensation of the face to pinprick and soft touch bilaterally. The strength of the facial muscles and the muscles to head turning and shoulder shrug are normal bilaterally. Speech is well enunciated, no aphasia or dysarthria is noted. Extraocular movements are full. Visual fields are full. The tongue is midline, and the patient has symmetric elevation of the soft palate. No obvious hearing deficits are noted.  Motor: The motor testing reveals 5 over 5 strength of all 4 extremities, with exception of 4/5 strength with hip flexion bilaterally. Good symmetric motor tone is noted throughout.  Sensory: Sensory testing is intact to pinprick, soft touch, vibration  sensation, and position sense on all 4 extremities, with exception of a stocking pattern pinprick sensory deficit two thirds the way up the legs bilaterally. No evidence of extinction is noted.  Coordination: Cerebellar testing reveals good finger-nose-finger and heel-to-shin bilaterally.  Gait and station: Gait is slightly wide-based, the patient uses a walker for ambulation Tandem gait is unsteady. Romberg is negative. No drift is seen.  Reflexes: Deep tendon reflexes are symmetric and normal bilaterally. Toes are downgoing bilaterally.   Assessment/Plan:  1. Gait disorder  2. Bilateral lower extremity weakness  3. Chronic low back pain  The patient does have some proximal weakness in both legs. She does report some low back pain. We will try to get the CT scan of the lumbosacral spine report from [facility]. The patient will have blood work done today, and she will be set up for nerve  conduction studies and EMG evaluation of both legs. She will follow-up for the study. The patient has had physical therapy for her walking within the last month.  Jill Alexanders MD 01/05/2015 7:02 PM  [facility] [address] [place], [place] [address]  Phone [phone] Fax [phone]

## 2015-01-05 NOTE — Patient Instructions (Signed)

## 2015-01-06 LAB — SPECIMEN STATUS REPORT

## 2015-01-07 LAB — VITAMIN B12: Vitamin B-12: 634 pg/mL (ref 211–946)

## 2015-01-07 LAB — ANGIOTENSIN CONVERTING ENZYME: Angio Convert Enzyme: 55 U/L (ref 14–82)

## 2015-01-07 LAB — RPR: RPR Ser Ql: NONREACTIVE

## 2015-01-07 LAB — COPPER, SERUM: Copper: 137 ug/dL (ref 72–166)

## 2015-01-07 LAB — CK: CK TOTAL: 64 U/L (ref 24–173)

## 2015-01-07 LAB — ANA W/REFLEX: ANA: NEGATIVE

## 2015-01-07 LAB — B. BURGDORFI ANTIBODIES

## 2015-01-10 ENCOUNTER — Ambulatory Visit (INDEPENDENT_AMBULATORY_CARE_PROVIDER_SITE_OTHER): Payer: Self-pay | Admitting: Neurology

## 2015-01-10 ENCOUNTER — Telehealth: Payer: Self-pay | Admitting: Neurology

## 2015-01-10 ENCOUNTER — Ambulatory Visit (INDEPENDENT_AMBULATORY_CARE_PROVIDER_SITE_OTHER): Payer: Medicare Other | Admitting: Neurology

## 2015-01-10 ENCOUNTER — Encounter: Payer: Self-pay | Admitting: Neurology

## 2015-01-10 DIAGNOSIS — R29898 Other symptoms and signs involving the musculoskeletal system: Secondary | ICD-10-CM

## 2015-01-10 DIAGNOSIS — R269 Unspecified abnormalities of gait and mobility: Secondary | ICD-10-CM

## 2015-01-10 NOTE — Progress Notes (Signed)
Please refer to EMG and nerve conduction study procedure note. 

## 2015-01-10 NOTE — Procedures (Signed)
     HISTORY:  Meiling Hendriks is a 74 year old patient with a one-year history of weakness in the legs and multiple falls. She is being evaluated for proximal leg weakness.  NERVE CONDUCTION STUDIES:  Nerve conduction studies were performed on both lower extremities. The distal motor latencies and motor amplitudes for the peroneal and posterior tibial nerves were within normal limits. The nerve conduction velocities for these nerves were also normal. The H reflex latencies were normal. The sensory latencies for the peroneal nerves were within normal limits.     EMG STUDIES:  EMG study was performed on the left lower extremity:  The tibialis anterior muscle reveals 2 to 4K motor units with full recruitment. No fibrillations or positive waves were seen. The peroneus tertius muscle reveals 2 to 4K motor units with full recruitment. No fibrillations or positive waves were seen. The medial gastrocnemius muscle reveals 1 to 3K motor units with full recruitment. No fibrillations or positive waves were seen. The vastus lateralis muscle reveals 2 to 4K motor units with full recruitment. No fibrillations or positive waves were seen. The iliopsoas muscle reveals 2 to 4K motor units with full recruitment. No fibrillations or positive waves were seen. The biceps femoris muscle (long head) reveals 2 to 4K motor units with full recruitment. No fibrillations or positive waves were seen. The lumbosacral paraspinal muscles were tested at 3 levels, and revealed no abnormalities of insertional activity at all 3 levels tested. There was good relaxation.  EMG study was performed on the left lower extremity:  The tibialis anterior muscle reveals 2 to 4K motor units with full recruitment. No fibrillations or positive waves were seen. The peroneus tertius muscle reveals 2 to 4K motor units with full recruitment. No fibrillations or positive waves were seen. The medial gastrocnemius muscle reveals 1 to 3K motor  units with full recruitment. No fibrillations or positive waves were seen. The vastus lateralis muscle reveals 2 to 4K motor units with full recruitment. No fibrillations or positive waves were seen. The iliopsoas muscle reveals 2 to 4K motor units with full recruitment. No fibrillations or positive waves were seen. The biceps femoris muscle (long head) reveals 2 to 4K motor units with full recruitment. No fibrillations or positive waves were seen. The lumbosacral paraspinal muscles were tested at 3 levels, and revealed no abnormalities of insertional activity at all 3 levels tested. There was good relaxation.   IMPRESSION:  Nerve conduction studies done on both lower extremities were within normal limits. EMG evaluation of both lower extremities were normal. No evidence of a lumbosacral radiculopathy or evidence of a myopathic disorder is seen on this evaluation.  Jill Alexanders MD 01/10/2015 10:53 AM  [facility] [address] [place], [place] [address]  Phone [phone] Fax [phone]

## 2015-01-10 NOTE — Telephone Encounter (Signed)
I called patient. EMG and nerve conduction study were unremarkable. No evidence of a neuropathy or lumbosacral radiculopathy. We will need the CT scan of the lumbar spine results from [facility].

## 2015-01-11 ENCOUNTER — Telehealth: Payer: Self-pay | Admitting: *Deleted

## 2015-01-11 ENCOUNTER — Telehealth: Payer: Self-pay | Admitting: Neurology

## 2015-01-11 NOTE — Telephone Encounter (Signed)
Requested ct of lumbar spine on patient for Dr Jannifer Franklin  01/11/15.

## 2015-01-11 NOTE — Telephone Encounter (Signed)
Rhonda Davenport can you get the CT scan of lumbar spine from [facility].

## 2015-01-11 NOTE — Telephone Encounter (Signed)
Received MRI results from [facility]. Lumbar spine reveals widespread lower thoracic and lumbar degeneration and scoliosis and multilevel spondylolisthesis. There is L4-5 and L5-S1 spondylolisthesis is most pronounced with facet joint arthropathy at both levels. There is moderate spinal stenosis and bilateral lateral recess stenosis at the L5-S1 level with right-sided neuroforaminal stenosis. Mild stenosis at the L4-5 level and bilateral L4 foraminal stenosis. No evidence of neuroforaminal stenosis at other levels.  The study was done on 03/09/2014.

## 2015-01-11 NOTE — Telephone Encounter (Signed)
Noted, please forward to Dr. Jannifer Franklin when received.  Thank you.

## 2015-01-11 NOTE — Telephone Encounter (Signed)
Records received from [facility] 01-11-15.

## 2015-02-07 DIAGNOSIS — M4306 Spondylolysis, lumbar region: Secondary | ICD-10-CM | POA: Insufficient documentation

## 2015-04-02 ENCOUNTER — Emergency Department (HOSPITAL_BASED_OUTPATIENT_CLINIC_OR_DEPARTMENT_OTHER)
Admission: EM | Admit: 2015-04-02 | Discharge: 2015-04-02 | Disposition: A | Discharge status: Home / Self Care | Payer: Medicare Other | Attending: Emergency Medicine | Admitting: Emergency Medicine

## 2015-04-02 ENCOUNTER — Encounter (HOSPITAL_BASED_OUTPATIENT_CLINIC_OR_DEPARTMENT_OTHER): Payer: Self-pay

## 2015-04-02 ENCOUNTER — Emergency Department (HOSPITAL_BASED_OUTPATIENT_CLINIC_OR_DEPARTMENT_OTHER): Payer: Medicare Other

## 2015-04-02 DIAGNOSIS — Z86718 Personal history of other venous thrombosis and embolism: Secondary | ICD-10-CM | POA: Insufficient documentation

## 2015-04-02 DIAGNOSIS — S59901A Unspecified injury of right elbow, initial encounter: Secondary | ICD-10-CM | POA: Diagnosis present

## 2015-04-02 DIAGNOSIS — Z9181 History of falling: Secondary | ICD-10-CM | POA: Insufficient documentation

## 2015-04-02 DIAGNOSIS — W01198A Fall on same level from slipping, tripping and stumbling with subsequent striking against other object, initial encounter: Secondary | ICD-10-CM | POA: Diagnosis not present

## 2015-04-02 DIAGNOSIS — Z79899 Other long term (current) drug therapy: Secondary | ICD-10-CM | POA: Diagnosis not present

## 2015-04-02 DIAGNOSIS — Y9289 Other specified places as the place of occurrence of the external cause: Secondary | ICD-10-CM | POA: Insufficient documentation

## 2015-04-02 DIAGNOSIS — Y9389 Activity, other specified: Secondary | ICD-10-CM | POA: Diagnosis not present

## 2015-04-02 DIAGNOSIS — E039 Hypothyroidism, unspecified: Secondary | ICD-10-CM | POA: Insufficient documentation

## 2015-04-02 DIAGNOSIS — I10 Essential (primary) hypertension: Secondary | ICD-10-CM | POA: Diagnosis not present

## 2015-04-02 DIAGNOSIS — Z87891 Personal history of nicotine dependence: Secondary | ICD-10-CM | POA: Diagnosis not present

## 2015-04-02 DIAGNOSIS — Z7982 Long term (current) use of aspirin: Secondary | ICD-10-CM | POA: Insufficient documentation

## 2015-04-02 DIAGNOSIS — Z8739 Personal history of other diseases of the musculoskeletal system and connective tissue: Secondary | ICD-10-CM | POA: Diagnosis not present

## 2015-04-02 DIAGNOSIS — S51001A Unspecified open wound of right elbow, initial encounter: Secondary | ICD-10-CM

## 2015-04-02 DIAGNOSIS — L309 Dermatitis, unspecified: Secondary | ICD-10-CM

## 2015-04-02 DIAGNOSIS — K219 Gastro-esophageal reflux disease without esophagitis: Secondary | ICD-10-CM | POA: Diagnosis not present

## 2015-04-02 DIAGNOSIS — G8929 Other chronic pain: Secondary | ICD-10-CM | POA: Insufficient documentation

## 2015-04-02 DIAGNOSIS — Y998 Other external cause status: Secondary | ICD-10-CM | POA: Diagnosis not present

## 2015-04-02 DIAGNOSIS — Z7952 Long term (current) use of systemic steroids: Secondary | ICD-10-CM | POA: Diagnosis not present

## 2015-04-02 DIAGNOSIS — W19XXXA Unspecified fall, initial encounter: Secondary | ICD-10-CM

## 2015-04-02 DIAGNOSIS — D649 Anemia, unspecified: Secondary | ICD-10-CM | POA: Diagnosis not present

## 2015-04-02 DIAGNOSIS — E780 Pure hypercholesterolemia: Secondary | ICD-10-CM | POA: Diagnosis not present

## 2015-04-02 MED ORDER — HYDROXYZINE HCL 10 MG PO TABS: 25.0000 mg | ORAL_TABLET | Freq: Three times a day (TID) | ORAL | Status: DC | PRN

## 2015-04-02 NOTE — ED Notes (Signed)
Pt reports slipping and falling over a dog dish last night - reports hitting her head, c/o pain to left leg (entire) - also reports pain to right elbow. Pt reports she was discharged from the hospital 2 weeks and has frequent falls - pt reports negative MRI/CT of her brain at that time. Pt states she thinks it it the "cholesterol" medicine making her fall. Pt to ED via friend. PT with bandage to right elbow but bleeding is controlled at this time. Pt lives alone but has emergency alert bracelet.

## 2015-04-02 NOTE — ED Provider Notes (Signed)
CSN: [id number]     Arrival date & time 04/02/15  1531 History  This chart was scribed for Carmin Muskrat, MD by [place] Peace, ED Scribe. The patient was seen in [place]. The patient's care was started at 4:31 PM.    Chief Complaint  Patient presents with  . Fall      Patient is a 74 y.o. female presenting with fall. The history is provided by the patient. No language interpreter was used.  Fall Pertinent negatives include no chest pain, no headaches and no shortness of breath.  HPI Comments: Rhonda Davenport is a 74 y.o. female who presents to the Emergency Department complaining of fall that occurred last night where pt tripped over her dog's food dish. Pt reports she hit her had during occurrence but mostly complains of right elbow pain. She further reports fall that occurred 2 weeks ago where pt previously injured her right elbow and suffered a laceration. She notes most recent fall has just made exacerbated previous wound she already had. Pt denies any lost of sensation in left hand, SOB, chest pain, LOC, or headache.Pt describes history of frequent of falls in the past. Pt has had MRI/CT tests performed on her brain which have all come back negative. She goes on to explain that she believes it is her "cholesterol "medication" that is causing her to fall so often.  Pt lives at home alone but does have emergency alert bracelet that she wears at all times.   Pt also complains of rash to distal aspect of left arm that has been ongoing for past 2 months. Pt explains affected area tends to be very itchy and begins to blister up whenever she scratches it.    Past Medical History  Diagnosis Date  . Hypertension   . High cholesterol   . Still's disease   . GERD (gastroesophageal reflux disease)   . Anemia   . Thyroid disease     Hypothyroidism  . H/O blood clots     pt states had blood clot in her rt arm  . Fall   . 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 Chronic low back pain    Past Surgical  History  Procedure Laterality Date  . Joint replacement      TKR, left  . Abdominal hysterectomy    . Appendectomy    . Shoulder surgery      Bilateral  . Carpal tunnel release Bilateral   . Tonsillectomy     Family History  Problem Relation Age of Onset  . Rheum arthritis Mother   . Rheum arthritis Father   . Healthy Brother    History  Substance Use Topics  . Smoking status: Former Smoker -- 1.00 packs/day for 26 years    Types: Cigarettes    Quit date: 12/10/1982  . Smokeless tobacco: Never Used  . Alcohol Use: No   OB History    No data available     Review of Systems  Constitutional:       Per HPI, otherwise negative  HENT:       Per HPI, otherwise negative  Respiratory: Negative for shortness of breath.        Per HPI, otherwise negative  Cardiovascular: Negative for chest pain.       Per HPI, otherwise negative  Gastrointestinal: Negative for vomiting.  Endocrine:       Negative aside from HPI  Genitourinary:       Neg aside from HPI   Musculoskeletal: Positive  for arthralgias (right elbow pain).       Per HPI, otherwise negative  Skin: Positive for wound.       Laceration to right elbow.   Neurological: Negative for syncope and headaches.      Allergies  Codeine  Home Medications   Prior to Admission medications   Medication Sig Start Date End Date Taking? Authorizing Provider  aspirin 81 MG tablet Take 81 mg by mouth at bedtime.   Yes Historical Provider, MD  ergocalciferol (VITAMIN D2) 50000 UNITS capsule Take 50,000 Units by mouth once a week. Wednesdays. For 12 weeks, ends on 04/21/14.   Yes Historical Provider, MD  fludrocortisone (FLORINEF) 0.1 MG tablet Take 0.1 mg by mouth daily after breakfast.   Yes Historical Provider, MD  folic acid (FOLVITE) 1 MG tablet 1 mg daily. 12/04/14  Yes Historical Provider, MD  levothyroxine (SYNTHROID, LEVOTHROID) 50 MCG tablet Take 50 mcg by mouth daily before breakfast.    Yes Historical Provider, MD   LORazepam (ATIVAN) 1 MG tablet Take 1 tablet (1 mg total) by mouth 3 times/day as needed-between meals & bedtime for anxiety or sleep. 03/29/14  Yes [name], MD  methotrexate (RHEUMATREX) 2.5 MG tablet Take 15 mg by mouth once a week. Caution:Chemotherapy. Protect from light. Takes on Wednesday's   Yes Historical Provider, MD  metoprolol tartrate (LOPRESSOR) 25 MG tablet Take 1 tablet (25 mg total) by mouth 2 (two) times daily. 03/29/14  Yes [name], MD  omeprazole (PRILOSEC) 40 MG capsule Take 1 capsule (40 mg total) by mouth daily. 12/25/12  Yes Tanda Rockers, MD  polyvinyl alcohol (LIQUIFILM TEARS) 1.4 % ophthalmic solution Place 1 drop into both eyes 3 (three) times daily as needed for dry eyes.   Yes Historical Provider, MD  potassium chloride (MICRO-K) 10 MEQ CR capsule Take 10 mEq by mouth daily. 12/16/14  Yes Historical Provider, MD  zolpidem (AMBIEN) 5 MG tablet Take 1 tablet (5 mg total) by mouth at bedtime as needed for sleep. 03/29/14  Yes [name], MD  BUTRANS 10 MCG/HR PTWK patch  12/19/14   Historical Provider, MD  triamcinolone cream (KENALOG) 0.1 %  10/26/14   Historical Provider, MD   BP 162/75 mmHg  Pulse 66  Temp(Src) 98.6 F (37 C) (Oral)  Resp 18  Ht 4\' 9"  (1.448 m)  Wt 115 lb (52.164 kg)  BMI 24.88 kg/m2  SpO2 98% Physical Exam  Constitutional: She is oriented to person, place, and time. She appears well-developed and well-nourished. No distress.  HENT:  Head: Normocephalic and atraumatic.  Eyes: Conjunctivae and EOM are normal.  Cardiovascular: Normal rate and regular rhythm.   Pulmonary/Chest: Effort normal and breath sounds normal. No stridor. No respiratory distress.  Abdominal: She exhibits no distension.  Musculoskeletal: Normal range of motion. She exhibits tenderness. She exhibits no edema.  Right elbow- Tenderness with 2cm avulsion to lateral aspect.   Neurological: She is alert and oriented to person, place, and time. No cranial nerve  deficit.  Skin: Skin is warm and dry.  Psychiatric: She has a normal mood and affect.  Nursing note and vitals reviewed.   ED Course  Procedures (including critical care time)  4:35 PM- Treatment plan was discussed with patient who verbalizes understanding and agrees.   MDM   Final diagnoses:  Fall, initial encounter  Avulsion of skin of elbow, right, initial encounter  Dermatitis  patient presents with concern of recurrent right elbow wound following falls per  Care no evidence for fracture, dislocation, and the patient is distally neurovascularly intact. Patient also has cutaneous lesions consistent with dermatitis, and she requested anti-itch medication. No evidence for neurovascular compromise, distress, systemic pathology.     Carmin Muskrat, MD 04/03/15 Laureen Abrahams

## 2015-04-02 NOTE — Discharge Instructions (Signed)
As discussed, it is important that you monitor your condition carefully, and do not hesitate to return here for further evaluation, should he require it.  To address your irritated skin, please use the medication provided carefully, and do not drive, or operate heavy machinery while using it.  In addition to the prescribed medication, please use non-petroleum based moisturizer on the affected areas.  Discussed this medication with your physician.   Close, please be sure to follow-up with your primary care physician for further evaluation of your skin irritation, and to insure that your right elbow skin wound is healing well.

## 2015-04-19 ENCOUNTER — Encounter (HOSPITAL_COMMUNITY): Payer: Self-pay

## 2015-04-19 ENCOUNTER — Emergency Department (HOSPITAL_COMMUNITY): Payer: Medicare Other

## 2015-04-19 ENCOUNTER — Emergency Department (HOSPITAL_COMMUNITY)
Admission: EM | Admit: 2015-04-19 | Discharge: 2015-04-19 | Disposition: A | Discharge status: Home / Self Care | Payer: Medicare Other | Attending: Emergency Medicine | Admitting: Emergency Medicine

## 2015-04-19 DIAGNOSIS — S40021A Contusion of right upper arm, initial encounter: Secondary | ICD-10-CM | POA: Insufficient documentation

## 2015-04-19 DIAGNOSIS — Y998 Other external cause status: Secondary | ICD-10-CM | POA: Diagnosis not present

## 2015-04-19 DIAGNOSIS — Z7952 Long term (current) use of systemic steroids: Secondary | ICD-10-CM | POA: Insufficient documentation

## 2015-04-19 DIAGNOSIS — G8929 Other chronic pain: Secondary | ICD-10-CM | POA: Diagnosis not present

## 2015-04-19 DIAGNOSIS — S40022A Contusion of left upper arm, initial encounter: Secondary | ICD-10-CM | POA: Insufficient documentation

## 2015-04-19 DIAGNOSIS — Z87891 Personal history of nicotine dependence: Secondary | ICD-10-CM | POA: Diagnosis not present

## 2015-04-19 DIAGNOSIS — R296 Repeated falls: Secondary | ICD-10-CM | POA: Insufficient documentation

## 2015-04-19 DIAGNOSIS — S8012XA Contusion of left lower leg, initial encounter: Secondary | ICD-10-CM | POA: Insufficient documentation

## 2015-04-19 DIAGNOSIS — E039 Hypothyroidism, unspecified: Secondary | ICD-10-CM | POA: Diagnosis not present

## 2015-04-19 DIAGNOSIS — K219 Gastro-esophageal reflux disease without esophagitis: Secondary | ICD-10-CM | POA: Diagnosis not present

## 2015-04-19 DIAGNOSIS — Z79899 Other long term (current) drug therapy: Secondary | ICD-10-CM | POA: Diagnosis not present

## 2015-04-19 DIAGNOSIS — Y9389 Activity, other specified: Secondary | ICD-10-CM | POA: Insufficient documentation

## 2015-04-19 DIAGNOSIS — W1839XA Other fall on same level, initial encounter: Secondary | ICD-10-CM | POA: Diagnosis not present

## 2015-04-19 DIAGNOSIS — Z86718 Personal history of other venous thrombosis and embolism: Secondary | ICD-10-CM | POA: Insufficient documentation

## 2015-04-19 DIAGNOSIS — S0990XA Unspecified injury of head, initial encounter: Secondary | ICD-10-CM | POA: Diagnosis not present

## 2015-04-19 DIAGNOSIS — Z7982 Long term (current) use of aspirin: Secondary | ICD-10-CM | POA: Diagnosis not present

## 2015-04-19 DIAGNOSIS — S8011XA Contusion of right lower leg, initial encounter: Secondary | ICD-10-CM | POA: Insufficient documentation

## 2015-04-19 DIAGNOSIS — S5001XA Contusion of right elbow, initial encounter: Secondary | ICD-10-CM | POA: Diagnosis not present

## 2015-04-19 DIAGNOSIS — E780 Pure hypercholesterolemia: Secondary | ICD-10-CM | POA: Insufficient documentation

## 2015-04-19 DIAGNOSIS — D649 Anemia, unspecified: Secondary | ICD-10-CM | POA: Diagnosis not present

## 2015-04-19 DIAGNOSIS — Y9289 Other specified places as the place of occurrence of the external cause: Secondary | ICD-10-CM | POA: Diagnosis not present

## 2015-04-19 LAB — CBC WITH DIFFERENTIAL/PLATELET
BASOS ABS: 0 10*3/uL (ref 0.0–0.1)
BASOS PCT: 0 % (ref 0–1)
Eosinophils Absolute: 0.2 10*3/uL (ref 0.0–0.7)
Eosinophils Relative: 2 % (ref 0–5)
HCT: 31.8 % — ABNORMAL LOW (ref 36.0–46.0)
Hemoglobin: 10.3 g/dL — ABNORMAL LOW (ref 12.0–15.0)
Lymphocytes Relative: 15 % (ref 12–46)
Lymphs Abs: 1.8 10*3/uL (ref 0.7–4.0)
MCH: 28.5 pg (ref 26.0–34.0)
MCHC: 32.4 g/dL (ref 30.0–36.0)
MCV: 88.1 fL (ref 78.0–100.0)
MONOS PCT: 6 % (ref 3–12)
Monocytes Absolute: 0.8 10*3/uL (ref 0.1–1.0)
Neutro Abs: 9.5 10*3/uL — ABNORMAL HIGH (ref 1.7–7.7)
Neutrophils Relative %: 77 % (ref 43–77)
Platelets: 230 10*3/uL (ref 150–400)
RBC: 3.61 MIL/uL — ABNORMAL LOW (ref 3.87–5.11)
RDW: 14.7 % (ref 11.5–15.5)
WBC: 12.3 10*3/uL — ABNORMAL HIGH (ref 4.0–10.5)

## 2015-04-19 LAB — URINALYSIS, ROUTINE W REFLEX MICROSCOPIC
Bilirubin Urine: NEGATIVE
Glucose, UA: NEGATIVE mg/dL
Ketones, ur: NEGATIVE mg/dL
LEUKOCYTES UA: NEGATIVE
Nitrite: NEGATIVE
PH: 6 (ref 5.0–8.0)
Protein, ur: NEGATIVE mg/dL
SPECIFIC GRAVITY, URINE: 1.019 (ref 1.005–1.030)
Urobilinogen, UA: 0.2 mg/dL (ref 0.0–1.0)

## 2015-04-19 LAB — BASIC METABOLIC PANEL
Anion gap: 5 (ref 5–15)
BUN: 24 mg/dL — ABNORMAL HIGH (ref 6–20)
CALCIUM: 8.8 mg/dL — AB (ref 8.9–10.3)
CO2: 21 mmol/L — ABNORMAL LOW (ref 22–32)
Chloride: 114 mmol/L — ABNORMAL HIGH (ref 101–111)
Creatinine, Ser: 1.01 mg/dL — ABNORMAL HIGH (ref 0.44–1.00)
GFR calc Af Amer: >60 mL/min (ref >60)
GFR calc non Af Amer: 53 mL/min — ABNORMAL LOW (ref >60)
Glucose, Bld: 103 mg/dL — ABNORMAL HIGH (ref 70–99)
Potassium: 3.7 mmol/L (ref 3.5–5.1)
Sodium: 140 mmol/L (ref 135–145)

## 2015-04-19 LAB — URINE MICROSCOPIC-ADD ON

## 2015-04-19 MED ORDER — ACETAMINOPHEN 325 MG PO TABS
650.0000 mg | ORAL_TABLET | Freq: Once | ORAL | Status: AC
  Administered 2015-04-19: 650 mg via ORAL
  Filled 2015-04-19: qty 2

## 2015-04-19 NOTE — ED Provider Notes (Signed)
CSN: [id number]     Arrival date & time 04/19/15  2015 History   First MD Initiated Contact with Patient 04/19/15 2040     Chief Complaint  Patient presents with  . 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 Patient unsure why she fell however she falls frequently. Patient supposed use a walker however per family walks too fast with it. Patient denies passing out, mild head injury, no neck pain, no new neurologic complaints. Patient aspirin. Patient has some assistance daily at home, is very stubborn per family.  Patient is a 74 y.o. female presenting with fall. The history is provided by the patient and a relative.  Fall Associated symptoms include headaches. Pertinent negatives include no chest pain, no abdominal pain and no shortness of breath.    Past Medical History  Diagnosis Date  . Hypertension   . High cholesterol   . Still's disease   . GERD (gastroesophageal reflux disease)   . Anemia   . Thyroid disease     Hypothyroidism  . H/O blood clots     pt states had blood clot in her rt arm  . Fall   . 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 Chronic low back pain    Past Surgical History  Procedure Laterality Date  . Joint replacement      TKR, left  . Abdominal hysterectomy    . Appendectomy    . Shoulder surgery      Bilateral  . Carpal tunnel release Bilateral   . Tonsillectomy     Family History  Problem Relation Age of Onset  . Rheum arthritis Mother   . Rheum arthritis Father   . Healthy Brother    History  Substance Use Topics  . Smoking status: Former Smoker -- 1.00 packs/day for 26 years    Types: Cigarettes    Quit date: 12/10/1982  . Smokeless tobacco: Never Used  . Alcohol Use: No   OB History    No data available     Review of Systems  Constitutional: Negative for fever and  chills.  HENT: Negative for congestion.   Eyes: Negative for visual disturbance.  Respiratory: Negative for shortness of breath.   Cardiovascular: Negative for chest pain.  Gastrointestinal: Negative for vomiting and abdominal pain.  Genitourinary: Negative for dysuria and flank pain.  Musculoskeletal: Negative for back pain, neck pain and neck stiffness.  Skin: Negative for rash.  Neurological: Positive for headaches. Negative for light-headedness.      Allergies  Ibuprofen; Benztropine; and Codeine  Home Medications   Prior to Admission medications   Medication Sig Start Date End Date Taking? Authorizing Provider  aspirin 81 MG tablet Take 81 mg by mouth at bedtime.   Yes Historical Provider, MD  Difluprednate (DUREZOL OP) Apply 1 drop to eye 3 (three) times daily.   Yes Historical Provider, MD  folic acid (FOLVITE) 1 MG tablet 1 mg daily. 12/04/14  Yes Historical Provider, MD  levothyroxine (SYNTHROID, LEVOTHROID) 50 MCG tablet Take 50 mcg by mouth daily before breakfast.    Yes Historical Provider, MD  metoprolol tartrate (LOPRESSOR) 25 MG tablet Take 1 tablet (25 mg total) by mouth 2 (two) times daily. 03/29/14  Yes [name], MD  omeprazole (PRILOSEC) 40 MG capsule Take 1 capsule (40 mg total) by mouth daily. 12/25/12  Yes Tanda Rockers, MD  polyvinyl alcohol (LIQUIFILM TEARS)  1.4 % ophthalmic solution Place 1 drop into both eyes 3 (three) times daily as needed for dry eyes.   Yes Historical Provider, MD  potassium chloride (MICRO-K) 10 MEQ CR capsule Take 10 mEq by mouth daily. 12/16/14  Yes Historical Provider, MD  ergocalciferol (VITAMIN D2) 50000 UNITS capsule Take 50,000 Units by mouth once a week. Tuesdays. For 12 weeks, ends on 04/21/14.    Historical Provider, MD  fludrocortisone (FLORINEF) 0.1 MG tablet Take 0.1 mg by mouth daily after breakfast.    Historical Provider, MD  hydrOXYzine (ATARAX/VISTARIL) 10 MG tablet Take 2.5 tablets (25 mg total) by mouth every 8 (eight)  hours as needed for itching. Patient not taking: Reported on 04/19/2015 04/02/15   Carmin Muskrat, MD  LORazepam (ATIVAN) 1 MG tablet Take 1 tablet (1 mg total) by mouth 3 times/day as needed-between meals & bedtime for anxiety or sleep. Patient not taking: Reported on 04/19/2015 03/29/14   [name], MD  methotrexate (RHEUMATREX) 2.5 MG tablet Take 15 mg by mouth once a week. Caution:Chemotherapy. Protect from light. Takes on [place] Provider, MD  triamcinolone cream (KENALOG) 0.1 %  10/26/14   Historical Provider, MD  zolpidem (AMBIEN) 5 MG tablet Take 1 tablet (5 mg total) by mouth at bedtime as needed for sleep. 03/29/14   [name], MD   BP 151/71 mmHg  Pulse 77  Temp(Src) 98.5 F (36.9 C) (Oral)  Resp 18  SpO2 96% Physical Exam  Constitutional: She is oriented to person, place, and time. She appears well-developed and well-nourished.  HENT:  Head: Normocephalic and atraumatic.  Eyes: Conjunctivae are normal. Right eye exhibits no discharge. Left eye exhibits no discharge.  Neck: Normal range of motion. Neck supple. No tracheal deviation present.  Cardiovascular: Normal rate and regular rhythm.   Pulmonary/Chest: Effort normal and breath sounds normal.  Abdominal: Soft. She exhibits no distension. There is no tenderness. There is no guarding.  Musculoskeletal: She exhibits tenderness. She exhibits no edema.  Patient denies midline vertebral tenderness, neck supple full range of motion. No tenderness with range of motion hips shoulders or knees. Patient has mild tenderness without effusion to right elbow with superficial abrasion. No wrist tenderness.  Neurological: She is alert and oriented to person, place, and time.  Skin: Skin is warm.  Multiple ecchymosis throughout arms and legs worse in the arms.  Psychiatric: She has a normal mood and affect.  Nursing note and vitals reviewed.   ED Course  Procedures (including critical care time) Labs Review Labs  Reviewed  CBC WITH DIFFERENTIAL/PLATELET - Abnormal; Notable for the following:    WBC 12.3 (*)    RBC 3.61 (*)    Hemoglobin 10.3 (*)    HCT 31.8 (*)    Neutro Abs 9.5 (*)    All other components within normal limits  BASIC METABOLIC PANEL - Abnormal; Notable for the following:    Chloride 114 (*)    CO2 21 (*)    Glucose, Bld 103 (*)    BUN 24 (*)    Creatinine, Ser 1.01 (*)    Calcium 8.8 (*)    GFR calc non Af Amer 53 (*)    All other components within normal limits  URINALYSIS, ROUTINE W REFLEX MICROSCOPIC - Abnormal; Notable for the following:    APPearance CLOUDY (*)    Hgb urine dipstick SMALL (*)    All other components within normal limits  URINE MICROSCOPIC-ADD ON - Abnormal; Notable for  the following:    Bacteria, UA FEW (*)    All other components within normal limits    Imaging Review Dg Elbow Complete Right  04/19/2015   CLINICAL DATA:  Pain following fall  EXAM: RIGHT ELBOW - COMPLETE 3+ VIEW  COMPARISON:  April 02, 2015  FINDINGS: Frontal, lateral, and bilateral oblique views were obtained. There is cortical irregularity along the olecranon process of the proximal ulna with several small calcifications in the region of the distal triceps tendon. There is mild soft tissue swelling in this area. The calcifications appear slightly more proximal compared to the previous study, raising question of potential distal triceps tendon tear or even avulsion. No acute fracture or dislocation is seen. No joint effusion. There is no appreciable joint space narrowing. No erosive change.  IMPRESSION: Prior trauma along the region of the olecranon portion of the proximal ulna. Several small calcifications appear slightly more proximal compared to the recent prior study. Question distal triceps tendon avulsion. No acute fracture or dislocation. No elbow joint effusion.   Electronically Signed   By: Lowella Grip III M.D.   On: 04/19/2015 21:50   Ct Head Wo Contrast  04/19/2015    CLINICAL DATA:  Several recent falls  EXAM: CT HEAD WITHOUT CONTRAST  TECHNIQUE: Contiguous axial images were obtained from the base of the skull through the vertex without intravenous contrast.  COMPARISON:  December 15, 2014  FINDINGS: Moderate diffuse atrophy is stable. There is no intracranial mass, hemorrhage, extra-axial fluid collection, or midline shift. 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 There is no new gray-white compartment lesion. No acute infarct apparent. The bony calvarium appears intact. The mastoid air cells are clear.  IMPRESSION: Atrophy with periventricular small vessel disease, stable. No intracranial mass, hemorrhage, or extra-axial fluid collection. No acute appearing infarct.   Electronically Signed   By: Lowella Grip III M.D.   On: 04/19/2015 21:35     EKG Interpretation   Date/Time:  Tuesday Apr 19 2015 20:49:33 EDT Ventricular Rate:  74 PR Interval:  174 QRS Duration: 84 QT Interval:  393 QTC Calculation: 436 R Axis:   -8 Text Interpretation:  Sinus rhythm Borderline T wave abnormalities  Baseline wander in lead(s) I aVL Confirmed by Teague Goynes  MD, Donyea Gafford ([id number]) on  04/19/2015 8:53:43 PM Also confirmed by Reather Converse  MD, Tisheena Maguire ([id number])  on  04/19/2015 10:05:29 PM      MDM   Final diagnoses:  Acute head injury  Frequent falls  Elbow contusion, right, initial encounter   Patient with frequent falls, mild head injury. Patient very stubborn and does not want significant assistance. Discussed importance of trying to prevent the falls area blood work, x-rays and CT scans no acute findings. Social work assisted to ensure more assistance at home discussed with patient. Face-to-face for outpatient help prevent falls and help with mobility.  Results and differential diagnosis were discussed with the patient/parent/guardian. Close follow up outpatient was discussed, comfortable with the plan.   Medications  acetaminophen  (TYLENOL) tablet 650 mg (not administered)    Filed Vitals:   04/19/15 2016  BP: 151/71  Pulse: 77  Temp: 98.5 F (36.9 C)  TempSrc: Oral  Resp: 18  SpO2: 96%    Final diagnoses:  Acute head injury  Frequent falls  Elbow contusion, right, initial encounter       Elnora Morrison, MD 04/19/15 2240

## 2015-04-19 NOTE — Discharge Instructions (Signed)
Take tylenol for pain.  If you were given medicines take as directed.  If you are on coumadin or contraceptives realize their levels and effectiveness is altered by many different medicines.  If you have any reaction (rash, tongues swelling, other) to the medicines stop taking and see a physician.   Please follow up as directed and return to the ER or see a physician for new or worsening symptoms.  Thank you. Filed Vitals:   04/19/15 2016  BP: 151/71  Pulse: 77  Temp: 98.5 F (36.9 C)  TempSrc: Oral  Resp: 18  SpO2: 96%

## 2015-04-19 NOTE — Progress Notes (Addendum)
EDCM spoke to patient and her niece Suzanna at bedside.  Patient reports she lives at home alone.  Patient's niece reports she checks in on patient daily by phone because she lives far away from patient but patient has friends who check in on her as well and notify patient's family in case of emergencies.  Patient's niece reports she has private duty nursing services who see the patient from 8-12 daily through agency called [facility].  Patient reports they assist her with cleaning and doing her laundry.  Patient reports she is able to perform her own ADL's.  Patient reports she has a walker (borrowed), cane, wheelchair bedside commode and shower chair at home.  Patient is requesting a new walker. Patient reports she does not want the walker with a seat.   Patient reports her pcp is Dr. Jani Gravel.  Patient reports she has [facility] coming out to see her next week.  Patient is agreeable to go to facility for short term rehab.  EDCM explained to patient it would be an out of pocket expense for her due to Medicare guidelines.  Patient then stated, "Then I'm not going."  Patient is agreeable to have [facility] set up home health services with [facility] for RN, PT, and SW.  Patient is also agreeable to [facility] consult and to receive contact information for [facility] to possibly assist for placement.  Patient reports she uses her walker every time she walks.  Patient reports she is taking her medications correctly. [facility] asked patient if she has any throw rugs on the floor in her home?  Patient stated, "I have some rugs on the floor, but I'm not going to pick them up."  Patient's niece reports patient has been at [facility] for short term rehab. Patient reports she does not cook.  Her home health aide takes her to get food, "Or I eat TV dinners." EDCM asessed for further dme or home health needs at this time.  Per patient, no further needs at this time.  Will discuss patient with EDP.  No further EDCM needs at this  time.  04/19/2015 A. Fabrizzio Marcella RNCM 2346pm EDCM spoke to patient earlier in shift.  Patient's niece reports patient does have a life alert device.  Patient also confirms she has an appointment with a neurologist and intends to keep it.  EDCM also provided patient list of out patient rehab facilities with [facility].  Patient reports her aide takes her to her appointments and she will think about increasing her private duty aide hours at home.  Home health aide added to home health orders.  [facility] faxed home health orders to [facility] at 2336pm with confirmation of receipt at 2340pm. [facility] faxed dme orders to [facility] 2351pm with confirmation of receipt at 2354pm. No further EDCM needs at this time.

## 2015-04-19 NOTE — ED Notes (Signed)
Patient is in xray.  I will collect labs when she returns.

## 2015-04-19 NOTE — ED Notes (Signed)
Pt is reluctant to give up her purse,  Her niece at bedside states she won't release her purse because she has Klonopin inside it.

## 2015-04-19 NOTE — ED Notes (Signed)
Pt had an unwitnessed fall this evening, she was able to call a family member to bring her to the hospital, patient complains of her right arm hurting and her head hurting, pt denies passing out

## 2015-04-19 NOTE — Care Management Note (Signed)
Case Management Note  Patient Details  Name: Rhonda Davenport MRN: [id number] Date of Birth: [birth year]  Subjective/Objective:    Patient presents to the ED with frequent falls at home, lives alone          Action/Plan:     Expected Discharge Date:                  Expected Discharge Plan:  [place]  In-House Referral:  Clinical Social Work  Discharge planning Services  CM Consult  Post Acute Care Choice:  Durable Medical Equipment Choice offered to:  Patient  DME Arranged:  [facility] Rile DME Agency:  [facility] Arranged:  RN, PT, Nurse's Aide (social work) [place]:  [place]  Status of Service:  Completed, signed off  Medicare Important Message Given:    Date Medicare IM Given:    Medicare IM give by:    Date Additional Medicare IM Given:    Additional Medicare Important Message give by:     If discussed at [place] of Stay Meetings, dates discussed:    Additional CommentsLivia Snellen, RN 04/19/2015, 11:54 PM

## 2015-04-19 NOTE — ED Notes (Signed)
Pt states she is hungry and would like to eat,  Pt is aware she cannot eat until results of test reviewed by MD

## 2015-04-19 NOTE — ED Notes (Signed)
Pt doesn't want an IV unless she is staying,  Supplies ready when or if needed

## 2015-04-19 NOTE — ED Notes (Signed)
Per niece at bedside pt has had multiple unwitnessed falls,  perrl  Pt is alert and oriented in NAD

## 2015-04-19 NOTE — ED Notes (Signed)
Pt ambulate without difficulty,  Pt states that she felt fine

## 2015-04-19 NOTE — ED Notes (Signed)
Pt started on plavix about two weeks ago

## 2015-04-20 NOTE — Progress Notes (Signed)
CSW met with pt's niece at bedside initially due to the pt being transported to Radiology.   Niece states that the pt lives at home alone and falls often. Also, she states that the pt needs assistance completing her ADL's. Per note, pt had an unwitnessed fall this evening. Niece informed CSW that the pt receives private duty nursing Monday through Friday between the hours of 8-12pm.  Niece/Suzzana [phone] Brother/Alan [place], [place] ED CSW 04/20/2015 12:43 AM

## 2015-04-22 NOTE — Progress Notes (Signed)
[facility] called patient for follow up and spoke to patient.  Patient confirms that she has received her walker and that [place] made a visit to her home yesterday.  No further EDCM needs at this time.

## 2015-05-02 ENCOUNTER — Institutional Professional Consult (permissible substitution): Payer: Medicare Other | Admitting: Neurology

## 2015-05-12 ENCOUNTER — Institutional Professional Consult (permissible substitution): Payer: Self-pay | Admitting: Neurology

## 2015-05-30 DIAGNOSIS — R5383 Other fatigue: Secondary | ICD-10-CM

## 2015-05-30 DIAGNOSIS — R5381 Other malaise: Secondary | ICD-10-CM | POA: Insufficient documentation

## 2015-05-30 DIAGNOSIS — I10 Essential (primary) hypertension: Secondary | ICD-10-CM | POA: Insufficient documentation

## 2015-06-21 DIAGNOSIS — R600 Localized edema: Secondary | ICD-10-CM | POA: Insufficient documentation

## 2015-06-21 DIAGNOSIS — I5022 Chronic systolic (congestive) heart failure: Secondary | ICD-10-CM | POA: Insufficient documentation

## 2015-06-22 DIAGNOSIS — D509 Iron deficiency anemia, unspecified: Secondary | ICD-10-CM | POA: Insufficient documentation

## 2015-09-06 DIAGNOSIS — G451 Carotid artery syndrome (hemispheric): Secondary | ICD-10-CM | POA: Insufficient documentation

## 2015-09-06 DIAGNOSIS — G459 Transient cerebral ischemic attack, unspecified: Secondary | ICD-10-CM | POA: Insufficient documentation

## 2015-10-06 DIAGNOSIS — F329 Major depressive disorder, single episode, unspecified: Secondary | ICD-10-CM | POA: Insufficient documentation

## 2015-10-12 DIAGNOSIS — J45909 Unspecified asthma, uncomplicated: Secondary | ICD-10-CM | POA: Insufficient documentation

## 2015-10-27 DIAGNOSIS — R270 Ataxia, unspecified: Secondary | ICD-10-CM | POA: Insufficient documentation

## 2015-10-27 DIAGNOSIS — G44321 Chronic post-traumatic headache, intractable: Secondary | ICD-10-CM | POA: Insufficient documentation

## 2015-10-27 DIAGNOSIS — R296 Repeated falls: Secondary | ICD-10-CM | POA: Insufficient documentation

## 2015-11-18 ENCOUNTER — Encounter (HOSPITAL_BASED_OUTPATIENT_CLINIC_OR_DEPARTMENT_OTHER): Payer: Self-pay | Admitting: *Deleted

## 2015-11-18 ENCOUNTER — Emergency Department (HOSPITAL_BASED_OUTPATIENT_CLINIC_OR_DEPARTMENT_OTHER): Payer: Medicare Other

## 2015-11-18 ENCOUNTER — Emergency Department (HOSPITAL_BASED_OUTPATIENT_CLINIC_OR_DEPARTMENT_OTHER)
Admission: EM | Admit: 2015-11-18 | Discharge: 2015-11-18 | Disposition: A | Discharge status: Home / Self Care | Payer: Medicare Other | Attending: Emergency Medicine | Admitting: Emergency Medicine

## 2015-11-18 DIAGNOSIS — Z79899 Other long term (current) drug therapy: Secondary | ICD-10-CM | POA: Insufficient documentation

## 2015-11-18 DIAGNOSIS — Z7952 Long term (current) use of systemic steroids: Secondary | ICD-10-CM | POA: Diagnosis not present

## 2015-11-18 DIAGNOSIS — H9203 Otalgia, bilateral: Secondary | ICD-10-CM | POA: Insufficient documentation

## 2015-11-18 DIAGNOSIS — Z86718 Personal history of other venous thrombosis and embolism: Secondary | ICD-10-CM | POA: Insufficient documentation

## 2015-11-18 DIAGNOSIS — G8929 Other chronic pain: Secondary | ICD-10-CM | POA: Diagnosis not present

## 2015-11-18 DIAGNOSIS — R05 Cough: Secondary | ICD-10-CM | POA: Insufficient documentation

## 2015-11-18 DIAGNOSIS — R5383 Other fatigue: Secondary | ICD-10-CM | POA: Diagnosis not present

## 2015-11-18 DIAGNOSIS — Z791 Long term (current) use of non-steroidal anti-inflammatories (NSAID): Secondary | ICD-10-CM | POA: Diagnosis not present

## 2015-11-18 DIAGNOSIS — R062 Wheezing: Secondary | ICD-10-CM | POA: Insufficient documentation

## 2015-11-18 DIAGNOSIS — J3489 Other specified disorders of nose and nasal sinuses: Secondary | ICD-10-CM | POA: Diagnosis not present

## 2015-11-18 DIAGNOSIS — R0989 Other specified symptoms and signs involving the circulatory and respiratory systems: Secondary | ICD-10-CM | POA: Diagnosis not present

## 2015-11-18 DIAGNOSIS — Z7902 Long term (current) use of antithrombotics/antiplatelets: Secondary | ICD-10-CM | POA: Diagnosis not present

## 2015-11-18 DIAGNOSIS — Z7982 Long term (current) use of aspirin: Secondary | ICD-10-CM | POA: Diagnosis not present

## 2015-11-18 DIAGNOSIS — K219 Gastro-esophageal reflux disease without esophagitis: Secondary | ICD-10-CM | POA: Diagnosis not present

## 2015-11-18 DIAGNOSIS — D649 Anemia, unspecified: Secondary | ICD-10-CM | POA: Diagnosis not present

## 2015-11-18 DIAGNOSIS — R112 Nausea with vomiting, unspecified: Secondary | ICD-10-CM

## 2015-11-18 DIAGNOSIS — Z8739 Personal history of other diseases of the musculoskeletal system and connective tissue: Secondary | ICD-10-CM | POA: Diagnosis not present

## 2015-11-18 DIAGNOSIS — I10 Essential (primary) hypertension: Secondary | ICD-10-CM | POA: Insufficient documentation

## 2015-11-18 DIAGNOSIS — E039 Hypothyroidism, unspecified: Secondary | ICD-10-CM | POA: Insufficient documentation

## 2015-11-18 DIAGNOSIS — R059 Cough, unspecified: Secondary | ICD-10-CM

## 2015-11-18 LAB — COMPREHENSIVE METABOLIC PANEL
ALBUMIN: 3.8 g/dL (ref 3.5–5.0)
ALT: 11 U/L — ABNORMAL LOW (ref 14–54)
AST: 17 U/L (ref 15–41)
Alkaline Phosphatase: 53 U/L (ref 38–126)
Anion gap: 5 (ref 5–15)
BUN: 17 mg/dL (ref 6–20)
CHLORIDE: 107 mmol/L (ref 101–111)
CO2: 29 mmol/L (ref 22–32)
Calcium: 10.9 mg/dL — ABNORMAL HIGH (ref 8.9–10.3)
Creatinine, Ser: 1.19 mg/dL — ABNORMAL HIGH (ref 0.44–1.00)
GFR calc Af Amer: 50 mL/min — ABNORMAL LOW (ref >60)
GFR calc non Af Amer: 44 mL/min — ABNORMAL LOW (ref >60)
Glucose, Bld: 109 mg/dL — ABNORMAL HIGH (ref 65–99)
Potassium: 3.5 mmol/L (ref 3.5–5.1)
Sodium: 141 mmol/L (ref 135–145)
Total Bilirubin: 0.3 mg/dL (ref 0.3–1.2)
Total Protein: 6.8 g/dL (ref 6.5–8.1)

## 2015-11-18 LAB — CBC
HCT: 34.9 % — ABNORMAL LOW (ref 36.0–46.0)
Hemoglobin: 11.5 g/dL — ABNORMAL LOW (ref 12.0–15.0)
MCH: 31.1 pg (ref 26.0–34.0)
MCHC: 33 g/dL (ref 30.0–36.0)
MCV: 94.3 fL (ref 78.0–100.0)
Platelets: 325 10*3/uL (ref 150–400)
RBC: 3.7 MIL/uL — ABNORMAL LOW (ref 3.87–5.11)
RDW: 15 % (ref 11.5–15.5)
WBC: 6.1 10*3/uL (ref 4.0–10.5)

## 2015-11-18 LAB — LIPASE, BLOOD: LIPASE: 36 U/L (ref 11–51)

## 2015-11-18 MED ORDER — SODIUM CHLORIDE 0.9 % IV BOLUS (SEPSIS)
500.0000 mL | Freq: Once | INTRAVENOUS | Status: AC
  Administered 2015-11-18: 500 mL via INTRAVENOUS

## 2015-11-18 MED ORDER — BENZONATATE 100 MG PO CAPS: 200.0000 mg | ORAL_CAPSULE | Freq: Two times a day (BID) | ORAL | Status: DC | PRN

## 2015-11-18 MED ORDER — ALBUTEROL SULFATE (2.5 MG/3ML) 0.083% IN NEBU: 5.0000 mg | INHALATION_SOLUTION | Freq: Once | RESPIRATORY_TRACT | Status: DC

## 2015-11-18 MED ORDER — ALBUTEROL SULFATE HFA 108 (90 BASE) MCG/ACT IN AERS
2.0000 | INHALATION_SPRAY | RESPIRATORY_TRACT | Status: DC | PRN
  Administered 2015-11-18: 2 via RESPIRATORY_TRACT
  Filled 2015-11-18: qty 6.7

## 2015-11-18 MED ORDER — ONDANSETRON 4 MG PO TBDP: ORAL_TABLET | ORAL | Status: DC

## 2015-11-18 MED ORDER — AEROCHAMBER PLUS FLO-VU MEDIUM MISC
1.0000 | Freq: Once | Status: AC
  Administered 2015-11-18: 1
  Filled 2015-11-18: qty 1

## 2015-11-18 NOTE — ED Notes (Signed)
Per pt report has been coughing for two weeks, seen PCP was given cough syrup. No fevers/ aches /body chills. Naus presently.

## 2015-11-18 NOTE — Discharge Instructions (Signed)
1. Medications:albuterol, zofran, tessalon, usual home medications 2. Treatment: rest, drink plenty of fluids, take tylenol or ibuprofen for fever control 3. Follow Up: Please followup with your primary doctor in 3 days for discussion of your diagnoses and further evaluation after today's visit; if you do not have a primary care doctor use the resource guide provided to find one; Return to the ER for high fevers, difficulty breathing or other concerning symptoms   Upper Respiratory Infection, Adult Most upper respiratory infections (URIs) are a viral infection of the air passages leading to the lungs. A URI affects the nose, throat, and upper air passages. The most common type of URI is nasopharyngitis and is typically referred to as "the common cold." URIs run their course and usually go away on their own. Most of the time, a URI does not require medical attention, but sometimes a bacterial infection in the upper airways can follow a viral infection. This is called a secondary infection. Sinus and middle ear infections are common types of secondary upper respiratory infections. Bacterial pneumonia can also complicate a URI. A URI can worsen asthma and chronic obstructive pulmonary disease (COPD). Sometimes, these complications can require emergency medical care and may be life threatening.  CAUSES Almost all URIs are caused by viruses. A virus is a type of germ and can spread from one person to another.  RISKS FACTORS You may be at risk for a URI if:   You smoke.   You have chronic heart or lung disease.  You have a weakened defense (immune) system.   You are very young or very old.   You have nasal allergies or asthma.  You work in crowded or poorly ventilated areas.  You work in health care facilities or schools. SIGNS AND SYMPTOMS  Symptoms typically develop 2-3 days after you come in contact with a cold virus. Most viral URIs last 7-10 days. However, viral URIs from the influenza  virus (flu virus) can last 14-18 days and are typically more severe. Symptoms may include:   Runny or stuffy (congested) nose.   Sneezing.   Cough.   Sore throat.   Headache.   Fatigue.   Fever.   Loss of appetite.   Pain in your forehead, behind your eyes, and over your cheekbones (sinus pain).  Muscle aches.  DIAGNOSIS  Your health care provider may diagnose a URI by:  Physical exam.  Tests to check that your symptoms are not due to another condition such as:  Strep throat.  Sinusitis.  Pneumonia.  Asthma. TREATMENT  A URI goes away on its own with time. It cannot be cured with medicines, but medicines may be prescribed or recommended to relieve symptoms. Medicines may help:  Reduce your fever.  Reduce your cough.  Relieve nasal congestion. HOME CARE INSTRUCTIONS   Take medicines only as directed by your health care provider.   Gargle warm saltwater or take cough drops to comfort your throat as directed by your health care provider.  Use a warm mist humidifier or inhale steam from a shower to increase air moisture. This may make it easier to breathe.  Drink enough fluid to keep your urine clear or pale yellow.   Eat soups and other clear broths and maintain good nutrition.   Rest as needed.   Return to work when your temperature has returned to normal or as your health care provider advises. You may need to stay home longer to avoid infecting others. You can also use a  face mask and careful hand washing to prevent spread of the virus.  Increase the usage of your inhaler if you have asthma.   Do not use any tobacco products, including cigarettes, chewing tobacco, or electronic cigarettes. If you need help quitting, ask your health care provider. PREVENTION  The best way to protect yourself from getting a cold is to practice good hygiene.   Avoid oral or hand contact with people with cold symptoms.   Wash your hands often if contact  occurs.  There is no clear evidence that vitamin C, vitamin E, echinacea, or exercise reduces the chance of developing a cold. However, it is always recommended to get plenty of rest, exercise, and practice good nutrition.  SEEK MEDICAL CARE IF:   You are getting worse rather than better.   Your symptoms are not controlled by medicine.   You have chills.  You have worsening shortness of breath.  You have brown or red mucus.  You have yellow or brown nasal discharge.  You have pain in your face, especially when you bend forward.  You have a fever.  You have swollen neck glands.  You have pain while swallowing.  You have white areas in the back of your throat. SEEK IMMEDIATE MEDICAL CARE IF:   You have severe or persistent:  Headache.  Ear pain.  Sinus pain.  Chest pain.  You have chronic lung disease and any of the following:  Wheezing.  Prolonged cough.  Coughing up blood.  A change in your usual mucus.  You have a stiff neck.  You have changes in your:  Vision.  Hearing.  Thinking.  Mood. MAKE SURE YOU:   Understand these instructions.  Will watch your condition.  Will get help right away if you are not doing well or get worse.   This information is not intended to replace advice given to you by your health care provider. Make sure you discuss any questions you have with your health care provider.   Document Released: 05/22/2001 Document Revised: 04/12/2015 Document Reviewed: 03/03/2014 Elsevier Interactive Patient Education [employer].

## 2015-11-18 NOTE — ED Notes (Signed)
Patient transported to X-ray 

## 2015-11-18 NOTE — ED Provider Notes (Signed)
CSN: [id number]     Arrival date & time 11/18/15  1415 History   First MD Initiated Contact with Patient 11/18/15 1442     Chief Complaint  Patient presents with  . Nasal Congestion  . Cough     (Consider location/radiation/quality/duration/timing/severity/associated sxs/prior Treatment) The history is provided by the patient and medical records. No language interpreter was used.     NYTIA EVITTS is a 74 y.o. female  with a hx of HTN, high cholesterol, Still's disease, GERD, anemia,. Hypothyroid, chronic low back pain presents to the Emergency Department complaining of gradual, persistent, cough onset 2 weeks ago.  Pt reports she was seen by her PCP yesterday and given cough medication however she reports it was too expensive to fill before she did not do so.  Associated symptoms include nausea and vomiting in the last 3 days, otalgia, "rattle in my chest" and fatigue.  Patient reports that her vomiting is sometimes posttussive and sometimes unrelated. She denies abdominal pain.  She denies sick contacts.  She reports she did receive her pneumonia shot this year. Pt denies smoking, ST, nasal congestion, fever, chills, headache, neck pain, chest pain, SOB, abd pain, diarrhea, weakness, dizziness, syncope, dysuria.  Nothing makes her symptoms better or worse.  Past Medical History  Diagnosis Date  . Hypertension   . High cholesterol   . 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 GERD (gastroesophageal reflux disease)   . Anemia   . Thyroid disease     Hypothyroidism  . H/O blood clots     pt states had blood clot in her rt arm  . Fall   . 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 Chronic low back pain    Past Surgical History  Procedure Laterality Date  . Joint replacement      TKR, left  . Abdominal hysterectomy    . Appendectomy    . Shoulder surgery      Bilateral  . Carpal tunnel release Bilateral   . Tonsillectomy     Family History  Problem Relation Age of Onset  . Rheum arthritis Mother   . Rheum  arthritis Father   . Healthy Brother    Social History  Substance Use Topics  . Smoking status: Former Smoker -- 1.00 packs/day for 26 years    Types: Cigarettes    Quit date: 12/10/1982  . Smokeless tobacco: Never Used  . Alcohol Use: No   OB History    No data available     Review of Systems  Constitutional: Negative for fever, chills, diaphoresis, appetite change, fatigue and unexpected weight change.  HENT: Positive for ear pain. Negative for congestion, ear discharge, mouth sores, postnasal drip, rhinorrhea, sinus pressure and sore throat.   Eyes: Negative for visual disturbance.  Respiratory: Positive for cough and wheezing (with coughing alone). Negative for chest tightness, shortness of breath and stridor.   Cardiovascular: Negative for chest pain, palpitations and leg swelling.  Gastrointestinal: Negative for nausea, vomiting, abdominal pain, diarrhea and constipation.  Endocrine: Negative for polydipsia, polyphagia and polyuria.  Genitourinary: Negative for dysuria, urgency, frequency and hematuria.  Musculoskeletal: Negative for myalgias, back pain, arthralgias and neck stiffness.  Skin: Negative for rash.  Allergic/Immunologic: Negative for immunocompromised state.  Neurological: Negative for syncope, light-headedness, numbness and headaches.  Hematological: Negative for adenopathy. Does not bruise/bleed easily.  Psychiatric/Behavioral: Negative for sleep disturbance. The patient is not nervous/anxious.   All other systems reviewed and are negative.     Allergies  Ibuprofen; Benztropine;  and Codeine  Home Medications   Prior to Admission medications   Medication Sig Start Date End Date Taking? Authorizing Provider  aspirin 81 MG tablet Take 81 mg by mouth at bedtime.   Yes Historical Provider, MD  clonazePAM (KLONOPIN) 1 MG tablet Take 0.5-1 tablets by mouth See admin instructions. 0.5 tablet twice daily as needed for anxiety and 1 tablet at bedtime. 04/16/15  Yes  Historical Provider, MD  clopidogrel (PLAVIX) 75 MG tablet Take 1 tablet by mouth daily. 04/06/15  Yes Historical Provider, MD  Difluprednate (DUREZOL OP) Apply 1 drop to eye 3 (three) times daily.   Yes Historical Provider, MD  ergocalciferol (VITAMIN D2) 50000 UNITS capsule Take 50,000 Units by mouth once a week. Tuesdays. For 12 weeks, ends on 04/21/14.   Yes Historical Provider, MD  folic acid (FOLVITE) 1 MG tablet 1 mg daily. 12/04/14  Yes Historical Provider, MD  hydrOXYzine (ATARAX/VISTARIL) 10 MG tablet Take 2.5 tablets (25 mg total) by mouth every 8 (eight) hours as needed for itching. 04/02/15  Yes Carmin Muskrat, MD  levothyroxine (SYNTHROID, LEVOTHROID) 50 MCG tablet Take 50 mcg by mouth daily before breakfast.    Yes Historical Provider, MD  LORazepam (ATIVAN) 1 MG tablet Take 1 tablet (1 mg total) by mouth 3 times/day as needed-between meals & bedtime for anxiety or sleep. 03/29/14  Yes [name], MD  meloxicam (MOBIC) 15 MG tablet Take 1 tablet by mouth every morning. 02/05/15  Yes Historical Provider, MD  methotrexate (RHEUMATREX) 2.5 MG tablet Take 15 mg by mouth once a week. Caution:Chemotherapy. Protect from light. Takes on Wednesday's   Yes Historical Provider, MD  metoprolol tartrate (LOPRESSOR) 25 MG tablet Take 1 tablet (25 mg total) by mouth 2 (two) times daily. 03/29/14  Yes [name], MD  omeprazole (PRILOSEC) 40 MG capsule Take 1 capsule (40 mg total) by mouth daily. 12/25/12  Yes Tanda Rockers, MD  polyvinyl alcohol (LIQUIFILM TEARS) 1.4 % ophthalmic solution Place 1 drop into both eyes 3 (three) times daily as needed for dry eyes.   Yes Historical Provider, MD  potassium chloride (MICRO-K) 10 MEQ CR capsule Take 10 mEq by mouth daily. 12/16/14  Yes Historical Provider, MD  zolpidem (AMBIEN) 5 MG tablet Take 1 tablet (5 mg total) by mouth at bedtime as needed for sleep. 03/29/14  Yes [name], MD  benzonatate (TESSALON) 100 MG capsule Take 2 capsules (200 mg total) by  mouth 2 (two) times daily as needed for cough. 11/18/15   Manford Sprong, PA-C  ondansetron (ZOFRAN ODT) 4 MG disintegrating tablet 4mg  ODT q4 hours prn nausea/vomit 11/18/15   Bernie Fobes, PA-C   BP 152/98 mmHg  Pulse 104  Temp(Src) 98.2 F (36.8 C) (Oral)  Resp 20  Ht 4\' 9"  (1.448 m)  Wt 49.896 kg  BMI 23.80 kg/m2  SpO2 98% Physical Exam  Constitutional: She is oriented to person, place, and time. She appears well-developed and well-nourished. No distress.  Awake, alert, nontoxic appearance  HENT:  Head: Normocephalic and atraumatic.  Right Ear: Tympanic membrane, external ear and ear canal normal.  Left Ear: Tympanic membrane, external ear and ear canal normal.  Nose: Mucosal edema and rhinorrhea present. No epistaxis. Right sinus exhibits no maxillary sinus tenderness and no frontal sinus tenderness. Left sinus exhibits no maxillary sinus tenderness and no frontal sinus tenderness.  Mouth/Throat: Uvula is midline, oropharynx is clear and moist and mucous membranes are normal. Mucous membranes are not pale and not  cyanotic. No oropharyngeal exudate, posterior oropharyngeal edema, posterior oropharyngeal erythema or tonsillar abscesses.  Eyes: Conjunctivae are normal. Pupils are equal, round, and reactive to light. No scleral icterus.  Neck: Normal range of motion and full passive range of motion without pain. Neck supple.  Cardiovascular: Normal rate, regular rhythm and intact distal pulses.   Pulmonary/Chest: Effort normal. No accessory muscle usage or stridor. No tachypnea. No respiratory distress. She has decreased breath sounds. She has no wheezes. She has rhonchi. She has no rales.  Course breath sounds throughout without focal wheezes, rhonchi, rales Wheezy cough  Abdominal: Soft. Bowel sounds are normal. She exhibits no mass. There is no tenderness. There is no rebound and no guarding.  Musculoskeletal: Normal range of motion. She exhibits no edema.  Lymphadenopathy:     She has no cervical adenopathy.  Neurological: She is alert and oriented to person, place, and time.  Speech is clear and goal oriented Moves extremities without ataxia  Skin: Skin is warm and dry. No rash noted. She is not diaphoretic.  Psychiatric: She has a normal mood and affect.  Nursing note and vitals reviewed.   ED Course  Procedures (including critical care time) Labs Review Labs Reviewed  CBC - Abnormal; Notable for the following:    RBC 3.70 (*)    Hemoglobin 11.5 (*)    HCT 34.9 (*)    All other components within normal limits  COMPREHENSIVE METABOLIC PANEL - Abnormal; Notable for the following:    Glucose, Bld 109 (*)    Creatinine, Ser 1.19 (*)    Calcium 10.9 (*)    ALT 11 (*)    GFR calc non Af Amer 44 (*)    GFR calc Af Amer 50 (*)    All other components within normal limits  LIPASE, BLOOD    Imaging Review Dg Chest 2 View  11/18/2015  CLINICAL DATA:  Cough for 1 week. EXAM: CHEST  2 VIEW COMPARISON:  03/25/2014, and 12/19/2012. FINDINGS: BILATERAL shoulder replacements. Cardiac monitoring device overlies the LEFT upper quadrant. Low lung volumes, with otherwise normal cardiomediastinal silhouette. Calcified tortuous aorta. No infiltrates or failure. Chronic thoracic spondylosis without definite acute compression deformity. IMPRESSION: No active cardiopulmonary disease. Electronically Signed   By: Staci Righter M.D.   On: 11/18/2015 15:08   I have personally reviewed and evaluated these images and lab results as part of my medical decision-making.    MDM   Final diagnoses:  Cough  Otalgia of both ears  Non-intractable vomiting with nausea, vomiting of unspecified type   Vida L Talford presents with URI symptoms.  Pt CXR negative for acute infiltrate. Patients symptoms are consistent with URI, likely viral etiology. Labs are reassuring.  She does have an elevation in nursing her creatinine. I believe this is likely due to dehydration. She has been  given 500 mL of fluid. I discussed this with her. She refuses adamantly any further treatment or workup. Her abdomen is soft and nontender. She's had no emesis here in the emergency department. Discussed that antibiotics are not indicated for viral infections. Patient's lung sounds are improved after her albuterol MDI. Will discharge home with her. Will prescribe Tessalon for her cough. Pt will be discharged with symptomatic treatment.  Verbalizes understanding and is agreeable with plan. Pt is hemodynamically stable & in NAD prior to dc.  Patient has a follow-up with her primary care physician in less than one week.  I have requested that she have her physician check her kidney  function at that visit.  BP 152/98 mmHg  Pulse 104  Temp(Src) 98.2 F (36.8 C) (Oral)  Resp 20  Ht 4\' 9"  (1.448 m)  Wt 49.896 kg  BMI 23.80 kg/m2  SpO2 96%    Abigail Butts, PA-C 11/18/15 1619  Leo Grosser, MD 11/18/15 [phone]

## 2015-12-15 DIAGNOSIS — R5383 Other fatigue: Secondary | ICD-10-CM | POA: Insufficient documentation

## 2015-12-19 ENCOUNTER — Emergency Department (HOSPITAL_COMMUNITY): Payer: Medicare Other

## 2015-12-19 ENCOUNTER — Inpatient Hospital Stay (HOSPITAL_COMMUNITY)
Admission: EM | Admit: 2015-12-19 | Discharge: 2015-12-20 | DRG: 948 | Disposition: A | Discharge status: Home Health | Payer: Medicare Other | Attending: Internal Medicine | Admitting: Internal Medicine

## 2015-12-19 ENCOUNTER — Encounter (HOSPITAL_COMMUNITY): Payer: Self-pay | Admitting: *Deleted

## 2015-12-19 ENCOUNTER — Observation Stay (HOSPITAL_COMMUNITY): Payer: Medicare Other

## 2015-12-19 DIAGNOSIS — Z7982 Long term (current) use of aspirin: Secondary | ICD-10-CM | POA: Diagnosis not present

## 2015-12-19 DIAGNOSIS — E7800 Pure hypercholesterolemia, unspecified: Secondary | ICD-10-CM | POA: Diagnosis present

## 2015-12-19 DIAGNOSIS — R400 Somnolence: Secondary | ICD-10-CM | POA: Diagnosis not present

## 2015-12-19 DIAGNOSIS — Z888 Allergy status to other drugs, medicaments and biological substances status: Secondary | ICD-10-CM

## 2015-12-19 DIAGNOSIS — Z791 Long term (current) use of non-steroidal anti-inflammatories (NSAID): Secondary | ICD-10-CM | POA: Diagnosis not present

## 2015-12-19 DIAGNOSIS — M0820 Juvenile rheumatoid arthritis with systemic onset, unspecified site: Secondary | ICD-10-CM | POA: Diagnosis not present

## 2015-12-19 DIAGNOSIS — Z87891 Personal history of nicotine dependence: Secondary | ICD-10-CM

## 2015-12-19 DIAGNOSIS — R296 Repeated falls: Secondary | ICD-10-CM | POA: Diagnosis present

## 2015-12-19 DIAGNOSIS — T424X5A Adverse effect of benzodiazepines, initial encounter: Secondary | ICD-10-CM | POA: Diagnosis present

## 2015-12-19 DIAGNOSIS — I10 Essential (primary) hypertension: Secondary | ICD-10-CM | POA: Diagnosis not present

## 2015-12-19 DIAGNOSIS — Z885 Allergy status to narcotic agent status: Secondary | ICD-10-CM

## 2015-12-19 DIAGNOSIS — K219 Gastro-esophageal reflux disease without esophagitis: Secondary | ICD-10-CM | POA: Diagnosis not present

## 2015-12-19 DIAGNOSIS — Z886 Allergy status to analgesic agent status: Secondary | ICD-10-CM | POA: Diagnosis not present

## 2015-12-19 DIAGNOSIS — N179 Acute kidney failure, unspecified: Secondary | ICD-10-CM | POA: Diagnosis present

## 2015-12-19 DIAGNOSIS — E039 Hypothyroidism, unspecified: Secondary | ICD-10-CM | POA: Diagnosis not present

## 2015-12-19 DIAGNOSIS — Z9181 History of falling: Secondary | ICD-10-CM

## 2015-12-19 DIAGNOSIS — Z7902 Long term (current) use of antithrombotics/antiplatelets: Secondary | ICD-10-CM | POA: Diagnosis not present

## 2015-12-19 DIAGNOSIS — G9340 Encephalopathy, unspecified: Secondary | ICD-10-CM

## 2015-12-19 DIAGNOSIS — T426X5A Adverse effect of other antiepileptic and sedative-hypnotic drugs, initial encounter: Secondary | ICD-10-CM | POA: Diagnosis not present

## 2015-12-19 DIAGNOSIS — R4182 Altered mental status, unspecified: Secondary | ICD-10-CM | POA: Diagnosis present

## 2015-12-19 DIAGNOSIS — Z96652 Presence of left artificial knee joint: Secondary | ICD-10-CM | POA: Diagnosis present

## 2015-12-19 DIAGNOSIS — Z79899 Other long term (current) drug therapy: Secondary | ICD-10-CM | POA: Diagnosis not present

## 2015-12-19 LAB — URINALYSIS, ROUTINE W REFLEX MICROSCOPIC
Glucose, UA: NEGATIVE mg/dL
HGB URINE DIPSTICK: NEGATIVE
KETONES UR: NEGATIVE mg/dL
Leukocytes, UA: NEGATIVE
NITRITE: NEGATIVE
PROTEIN: NEGATIVE mg/dL
Specific Gravity, Urine: 1.02 (ref 1.005–1.030)
pH: 6 (ref 5.0–8.0)

## 2015-12-19 LAB — CBC
HCT: 37.3 % (ref 36.0–46.0)
Hemoglobin: 12.2 g/dL (ref 12.0–15.0)
MCH: 30.9 pg (ref 26.0–34.0)
MCHC: 32.7 g/dL (ref 30.0–36.0)
MCV: 94.4 fL (ref 78.0–100.0)
PLATELETS: 322 10*3/uL (ref 150–400)
RBC: 3.95 MIL/uL (ref 3.87–5.11)
RDW: 12.6 % (ref 11.5–15.5)
WBC: 6.7 10*3/uL (ref 4.0–10.5)

## 2015-12-19 LAB — RAPID URINE DRUG SCREEN, HOSP PERFORMED
Amphetamines: NOT DETECTED
Barbiturates: NOT DETECTED
Benzodiazepines: POSITIVE — AB
Cocaine: NOT DETECTED
OPIATES: POSITIVE — AB
TETRAHYDROCANNABINOL: NOT DETECTED

## 2015-12-19 LAB — COMPREHENSIVE METABOLIC PANEL
ALT: 14 U/L (ref 14–54)
AST: 19 U/L (ref 15–41)
Albumin: 4.4 g/dL (ref 3.5–5.0)
Alkaline Phosphatase: 60 U/L (ref 38–126)
Anion gap: 10 (ref 5–15)
BUN: 22 mg/dL — AB (ref 6–20)
CHLORIDE: 104 mmol/L (ref 101–111)
CO2: 22 mmol/L (ref 22–32)
CREATININE: 1.33 mg/dL — AB (ref 0.44–1.00)
Calcium: 9.9 mg/dL (ref 8.9–10.3)
GFR, EST AFRICAN AMERICAN: 44 mL/min — AB (ref >60)
GFR, EST NON AFRICAN AMERICAN: 38 mL/min — AB (ref >60)
Glucose, Bld: 97 mg/dL (ref 65–99)
Potassium: 3.9 mmol/L (ref 3.5–5.1)
Sodium: 136 mmol/L (ref 135–145)
TOTAL PROTEIN: 7.8 g/dL (ref 6.5–8.1)
Total Bilirubin: 0.6 mg/dL (ref 0.3–1.2)

## 2015-12-19 LAB — PROTIME-INR
INR: 1.36 (ref 0.00–1.49)
PROTHROMBIN TIME: 16.9 s — AB (ref 11.6–15.2)

## 2015-12-19 LAB — TROPONIN I

## 2015-12-19 MED ORDER — METOPROLOL TARTRATE 25 MG PO TABS
25.0000 mg | ORAL_TABLET | Freq: Two times a day (BID) | ORAL | Status: DC
  Administered 2015-12-19 – 2015-12-20 (×3): 25 mg via ORAL
  Filled 2015-12-19 (×4): qty 1

## 2015-12-19 MED ORDER — SODIUM CHLORIDE 0.9 % IV SOLN
INTRAVENOUS | Status: DC
  Administered 2015-12-19 – 2015-12-20 (×2): via INTRAVENOUS

## 2015-12-19 MED ORDER — SODIUM CHLORIDE 0.9 % IV SOLN: INTRAVENOUS | Status: DC

## 2015-12-19 MED ORDER — FOLIC ACID 1 MG PO TABS
1.0000 mg | ORAL_TABLET | Freq: Every day | ORAL | Status: DC
  Administered 2015-12-19 – 2015-12-20 (×2): 1 mg via ORAL
  Filled 2015-12-19 (×2): qty 1

## 2015-12-19 MED ORDER — ZOLPIDEM TARTRATE 5 MG PO TABS: 5.0000 mg | ORAL_TABLET | Freq: Every evening | ORAL | Status: DC | PRN

## 2015-12-19 MED ORDER — MONTELUKAST SODIUM 10 MG PO TABS
10.0000 mg | ORAL_TABLET | Freq: Every day | ORAL | Status: DC
  Administered 2015-12-19: 10 mg via ORAL
  Filled 2015-12-19 (×2): qty 1

## 2015-12-19 MED ORDER — ONDANSETRON HCL 4 MG PO TABS: 4.0000 mg | ORAL_TABLET | Freq: Four times a day (QID) | ORAL | Status: DC | PRN

## 2015-12-19 MED ORDER — LEVOTHYROXINE SODIUM 50 MCG PO TABS
50.0000 ug | ORAL_TABLET | Freq: Every day | ORAL | Status: DC
  Administered 2015-12-20: 50 ug via ORAL
  Filled 2015-12-19 (×2): qty 1

## 2015-12-19 MED ORDER — PANTOPRAZOLE SODIUM 40 MG PO TBEC
40.0000 mg | DELAYED_RELEASE_TABLET | Freq: Every day | ORAL | Status: DC
  Administered 2015-12-19 – 2015-12-20 (×2): 40 mg via ORAL
  Filled 2015-12-19 (×2): qty 1

## 2015-12-19 MED ORDER — BUSPIRONE HCL 10 MG PO TABS
10.0000 mg | ORAL_TABLET | Freq: Two times a day (BID) | ORAL | Status: DC
Start: 2015-12-19 — End: 2015-12-20
  Administered 2015-12-19 – 2015-12-20 (×3): 10 mg via ORAL
  Filled 2015-12-19 (×4): qty 1

## 2015-12-19 MED ORDER — CLOPIDOGREL BISULFATE 75 MG PO TABS
75.0000 mg | ORAL_TABLET | Freq: Every day | ORAL | Status: DC
  Administered 2015-12-19 – 2015-12-20 (×2): 75 mg via ORAL
  Filled 2015-12-19 (×2): qty 1

## 2015-12-19 MED ORDER — ACETAMINOPHEN 325 MG PO TABS
650.0000 mg | ORAL_TABLET | ORAL | Status: DC | PRN
  Administered 2015-12-19: 650 mg via ORAL
  Filled 2015-12-19: qty 2

## 2015-12-19 MED ORDER — ESCITALOPRAM OXALATE 10 MG PO TABS
10.0000 mg | ORAL_TABLET | Freq: Every day | ORAL | Status: DC
  Administered 2015-12-19 – 2015-12-20 (×2): 10 mg via ORAL
  Filled 2015-12-19 (×2): qty 1

## 2015-12-19 MED ORDER — SODIUM CHLORIDE 0.9 % IJ SOLN
3.0000 mL | Freq: Two times a day (BID) | INTRAMUSCULAR | Status: DC
  Administered 2015-12-19 – 2015-12-20 (×3): 3 mL via INTRAVENOUS

## 2015-12-19 MED ORDER — ASPIRIN EC 81 MG PO TBEC
81.0000 mg | DELAYED_RELEASE_TABLET | Freq: Every day | ORAL | Status: DC
  Administered 2015-12-19: 81 mg via ORAL
  Filled 2015-12-19 (×2): qty 1

## 2015-12-19 MED ORDER — ALBUTEROL SULFATE 4 MG PO TABS
4.0000 mg | ORAL_TABLET | Freq: Three times a day (TID) | ORAL | Status: DC
  Administered 2015-12-19 – 2015-12-20 (×3): 4 mg via ORAL
  Filled 2015-12-19 (×5): qty 1

## 2015-12-19 MED ORDER — ONDANSETRON HCL 4 MG/2ML IJ SOLN
4.0000 mg | Freq: Four times a day (QID) | INTRAMUSCULAR | Status: DC | PRN
  Administered 2015-12-19: 4 mg via INTRAVENOUS
  Filled 2015-12-19 (×2): qty 2

## 2015-12-19 MED ORDER — ENOXAPARIN SODIUM 40 MG/0.4ML ~~LOC~~ SOLN
40.0000 mg | SUBCUTANEOUS | Status: DC
  Administered 2015-12-19: 40 mg via SUBCUTANEOUS
  Filled 2015-12-19 (×2): qty 0.4

## 2015-12-19 MED ORDER — POLYVINYL ALCOHOL 1.4 % OP SOLN
1.0000 [drp] | Freq: Three times a day (TID) | OPHTHALMIC | Status: DC | PRN
  Filled 2015-12-19: qty 15

## 2015-12-19 NOTE — ED Provider Notes (Signed)
CSN: [id number]     Arrival date & time 12/19/15  0901 History   First MD Initiated Contact with Patient 12/19/15 [phone]     Chief Complaint  Patient presents with  . Fall     (Consider location/radiation/quality/duration/timing/severity/associated sxs/prior Treatment) Patient is a 75 y.o. female presenting with fall. The history is provided by the patient and the EMS personnel. The history is limited by the condition of the patient.  Fall Pertinent negatives include no chest pain, no abdominal pain, no headaches and no shortness of breath.  Patient arrives via EMS, w report of recent falls in the past week.  Pt at baseline walks w walker, hx non compliance w meds, at times periods confusion, at times walks without walker.  Family member indicated to family mechanical falls, no faintness or loc.  Pt reportedly recently evaluated at [facility] for same. Pt alert, awake on arrival, and denies pain or other c/o.  Patient limited historian - level 5 caveat.     Past Medical History  Diagnosis Date  . Hypertension   . High cholesterol   . 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 GERD (gastroesophageal reflux disease)   . Anemia   . Thyroid disease     Hypothyroidism  . H/O blood clots     pt states had blood clot in her rt arm  . Fall   . 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 Chronic low back pain    Past Surgical History  Procedure Laterality Date  . Joint replacement      TKR, left  . Abdominal hysterectomy    . Appendectomy    . Shoulder surgery      Bilateral  . Carpal tunnel release Bilateral   . Tonsillectomy     Family History  Problem Relation Age of Onset  . Rheum arthritis Mother   . Rheum arthritis Father   . Healthy Brother    Social History  Substance Use Topics  . Smoking status: Former Smoker -- 1.00 packs/day for 26 years    Types: Cigarettes    Quit date: 12/10/1982  . Smokeless tobacco: Never Used  . Alcohol Use: No   OB History    No data available     Review of  Systems  Constitutional: Negative for fever and chills.  HENT: Negative for sore throat.   Eyes: Negative for redness.  Respiratory: Negative for shortness of breath.   Cardiovascular: Negative for chest pain.  Gastrointestinal: Negative for vomiting, abdominal pain and diarrhea.  Genitourinary: Negative for dysuria and flank pain.  Musculoskeletal: Negative for back pain and neck pain.  Skin: Negative for rash.  Neurological: Negative for weakness, numbness and headaches.  Hematological: Does not bruise/bleed easily.  Psychiatric/Behavioral: Negative for confusion.      Allergies  Ibuprofen; Benztropine; and Codeine  Home Medications   Prior to Admission medications   Medication Sig Start Date End Date Taking? Authorizing Provider  aspirin 81 MG tablet Take 81 mg by mouth at bedtime.    Historical Provider, MD  benzonatate (TESSALON) 100 MG capsule Take 2 capsules (200 mg total) by mouth 2 (two) times daily as needed for cough. 11/18/15   Hannah Muthersbaugh, PA-C  clonazePAM (KLONOPIN) 1 MG tablet Take 0.5-1 tablets by mouth See admin instructions. 0.5 tablet twice daily as needed for anxiety and 1 tablet at bedtime. 04/16/15   Historical Provider, MD  clopidogrel (PLAVIX) 75 MG tablet Take 1 tablet by mouth daily. 04/06/15   Historical Provider, MD  Difluprednate (DUREZOL OP) Apply 1 drop to eye 3 (three) times daily.    Historical Provider, MD  ergocalciferol (VITAMIN D2) 50000 UNITS capsule Take 50,000 Units by mouth once a week. Tuesdays. For 12 weeks, ends on 04/21/14.    Historical Provider, MD  folic acid (FOLVITE) 1 MG tablet 1 mg daily. 12/04/14   Historical Provider, MD  hydrOXYzine (ATARAX/VISTARIL) 10 MG tablet Take 2.5 tablets (25 mg total) by mouth every 8 (eight) hours as needed for itching. 04/02/15   Carmin Muskrat, MD  levothyroxine (SYNTHROID, LEVOTHROID) 50 MCG tablet Take 50 mcg by mouth daily before breakfast.     Historical Provider, MD  LORazepam (ATIVAN) 1 MG  tablet Take 1 tablet (1 mg total) by mouth 3 times/day as needed-between meals & bedtime for anxiety or sleep. 03/29/14   [name], MD  meloxicam (MOBIC) 15 MG tablet Take 1 tablet by mouth every morning. 02/05/15   Historical Provider, MD  methotrexate (RHEUMATREX) 2.5 MG tablet Take 15 mg by mouth once a week. Caution:Chemotherapy. Protect from light. Takes on [place] Provider, MD  metoprolol tartrate (LOPRESSOR) 25 MG tablet Take 1 tablet (25 mg total) by mouth 2 (two) times daily. 03/29/14   [name], MD  omeprazole (PRILOSEC) 40 MG capsule Take 1 capsule (40 mg total) by mouth daily. 12/25/12   Tanda Rockers, MD  ondansetron (ZOFRAN ODT) 4 MG disintegrating tablet 4mg  ODT q4 hours prn nausea/vomit 11/18/15   Hannah Muthersbaugh, PA-C  polyvinyl alcohol (LIQUIFILM TEARS) 1.4 % ophthalmic solution Place 1 drop into both eyes 3 (three) times daily as needed for dry eyes.    Historical Provider, MD  potassium chloride (MICRO-K) 10 MEQ CR capsule Take 10 mEq by mouth daily. 12/16/14   Historical Provider, MD  zolpidem (AMBIEN) 5 MG tablet Take 1 tablet (5 mg total) by mouth at bedtime as needed for sleep. 03/29/14   [name], MD   There were no vitals taken for this visit. Physical Exam  Constitutional: She is oriented to person, place, and time. She appears well-developed and well-nourished. No distress.  HENT:  Head: Atraumatic.  Eyes: Conjunctivae are normal. Pupils are equal, round, and reactive to light. No scleral icterus.  Neck: Normal range of motion. Neck supple. No tracheal deviation present. No thyromegaly present.  No bruit  Cardiovascular: Normal rate, regular rhythm, normal heart sounds and intact distal pulses.  Exam reveals no gallop and no friction rub.   No murmur heard. Pulmonary/Chest: Effort normal and breath sounds normal. No respiratory distress. She exhibits no tenderness.  Abdominal: Soft. Normal appearance. She exhibits no distension. There is  no tenderness.  Musculoskeletal: She exhibits no edema or tenderness.  CTLS spine, non tender, aligned, no step off. Good rom bil ext without pain or focal bony tenderness  Neurological: She is alert and oriented to person, place, and time.  Motor intact bil. sens grossly intact  Skin: Skin is warm and dry. No rash noted. She is not diaphoretic.  Psychiatric: She has a normal mood and affect.  Nursing note and vitals reviewed.   ED Course  Procedures (including critical care time) Labs Review  Results for orders placed or performed during the hospital encounter of 12/19/15  C difficile quick scan w PCR reflex  Result Value Ref Range   C Diff antigen NEGATIVE NEGATIVE   C Diff toxin NEGATIVE NEGATIVE   C Diff interpretation Negative for toxigenic C. difficile   Comprehensive metabolic  panel  Result Value Ref Range   Sodium 136 135 - 145 mmol/L   Potassium 3.9 3.5 - 5.1 mmol/L   Chloride 104 101 - 111 mmol/L   CO2 22 22 - 32 mmol/L   Glucose, Bld 97 65 - 99 mg/dL   BUN 22 (H) 6 - 20 mg/dL   Creatinine, Ser 1.33 (H) 0.44 - 1.00 mg/dL   Calcium 9.9 8.9 - 10.3 mg/dL   Total Protein 7.8 6.5 - 8.1 g/dL   Albumin 4.4 3.5 - 5.0 g/dL   AST 19 15 - 41 U/L   ALT 14 14 - 54 U/L   Alkaline Phosphatase 60 38 - 126 U/L   Total Bilirubin 0.6 0.3 - 1.2 mg/dL   GFR calc non Af Amer 38 (L) >60 mL/min   GFR calc Af Amer 44 (L) >60 mL/min   Anion gap 10 5 - 15  CBC  Result Value Ref Range   WBC 6.7 4.0 - 10.5 K/uL   RBC 3.95 3.87 - 5.11 MIL/uL   Hemoglobin 12.2 12.0 - 15.0 g/dL   HCT 37.3 36.0 - 46.0 %   MCV 94.4 78.0 - 100.0 fL   MCH 30.9 26.0 - 34.0 pg   MCHC 32.7 30.0 - 36.0 g/dL   RDW 12.6 11.5 - 15.5 %   Platelets 322 150 - 400 K/uL  Protime-INR  Result Value Ref Range   Prothrombin Time 16.9 (H) 11.6 - 15.2 seconds   INR 1.36 0.00 - 1.49  Urinalysis, Routine w reflex microscopic (not at [facility])  Result Value Ref Range   Color, Urine YELLOW YELLOW   APPearance CLEAR CLEAR    Specific Gravity, Urine 1.020 1.005 - 1.030   pH 6.0 5.0 - 8.0   Glucose, UA NEGATIVE NEGATIVE mg/dL   Hgb urine dipstick NEGATIVE NEGATIVE   Bilirubin Urine SMALL (A) NEGATIVE   Ketones, ur NEGATIVE NEGATIVE mg/dL   Protein, ur NEGATIVE NEGATIVE mg/dL   Nitrite NEGATIVE NEGATIVE   Leukocytes, UA NEGATIVE NEGATIVE  Troponin I  Result Value Ref Range   Troponin I <0.03 <0.031 ng/mL  Urine rapid drug screen (hosp performed)  Result Value Ref Range   Opiates POSITIVE (A) NONE DETECTED   Cocaine NONE DETECTED NONE DETECTED   Benzodiazepines POSITIVE (A) NONE DETECTED   Amphetamines NONE DETECTED NONE DETECTED   Tetrahydrocannabinol NONE DETECTED NONE DETECTED   Barbiturates NONE DETECTED NONE DETECTED  TSH  Result Value Ref Range   TSH 0.404 0.350 - 4.500 uIU/mL  Basic metabolic panel  Result Value Ref Range   Sodium 138 135 - 145 mmol/L   Potassium 4.1 3.5 - 5.1 mmol/L   Chloride 109 101 - 111 mmol/L   CO2 20 (L) 22 - 32 mmol/L   Glucose, Bld 94 65 - 99 mg/dL   BUN 26 (H) 6 - 20 mg/dL   Creatinine, Ser 1.19 (H) 0.44 - 1.00 mg/dL   Calcium 9.5 8.9 - 10.3 mg/dL   GFR calc non Af Amer 44 (L) >60 mL/min   GFR calc Af Amer 50 (L) >60 mL/min   Anion gap 9 5 - 15  CBC  Result Value Ref Range   WBC 6.0 4.0 - 10.5 K/uL   RBC 3.97 3.87 - 5.11 MIL/uL   Hemoglobin 12.2 12.0 - 15.0 g/dL   HCT 37.4 36.0 - 46.0 %   MCV 94.2 78.0 - 100.0 fL   MCH 30.7 26.0 - 34.0 pg   MCHC 32.6 30.0 - 36.0 g/dL  RDW 12.5 11.5 - 15.5 %   Platelets 319 150 - 400 K/uL   Ct Head Wo Contrast  12/19/2015  CLINICAL DATA:  Multiple falls.  Head injury. EXAM: CT HEAD WITHOUT CONTRAST TECHNIQUE: Contiguous axial images were obtained from the base of the skull through the vertex without intravenous contrast. COMPARISON:  04/19/2015 FINDINGS: Moderate atrophy with moderate chronic microvascular ischemic change, similar to the prior study. Negative for acute infarct. Negative for intracranial hemorrhage. Negative  for mass or edema. No shift of the midline structures. Negative for skull fracture. Mucosal edema in the paranasal sinuses IMPRESSION: Atrophy and chronic microvascular ischemia.  No acute infarct. Electronically Signed   By: Franchot Gallo M.D.   On: 12/19/2015 10:05   Mr Brain Wo Contrast  12/19/2015  CLINICAL DATA:  Altered mental status with confusion. EXAM: MRI HEAD WITHOUT CONTRAST TECHNIQUE: Multiplanar, multiecho pulse sequences of the brain and surrounding structures were obtained without intravenous contrast. COMPARISON:  CT head 12/19/2015. MRI brain and cervical spine 03/26/2014. FINDINGS: The patient was unable to remain motionless for the exam. Small or subtle lesions could be overlooked. No evidence for acute stroke, acute hemorrhage, mass lesion, or extra-axial fluid. Advanced atrophy with hydrocephalus ex vacuo. Extensive focal and confluent T2 and FLAIR hyperintensities throughout the white matter, likely chronic microvascular ischemic change. Motion degraded sagittal images demonstrate mild pannus and slight basilar invagination. This is incompletely evaluated, but the observed axial images do not show cervicomedullary compression. There is advanced multilevel disc space narrowing redemonstrated. Flow voids are maintained.  There are no foci of chronic hemorrhage. There is extensive paranasal sinus disease with mucosal thickening in the LEFT division sphenoid and LEFT maxillary sinus. BILATERAL cataract extraction. No air-fluid level. No mastoid fluid. Compared with priors, a similar appearance is noted. IMPRESSION: Motion degraded examination demonstrating no acute intracranial findings. Advanced atrophy with extensive white matter disease. Chronic sinusitis.  Cervical spondylosis. Electronically Signed   By: Staci Righter M.D.   On: 12/19/2015 18:57   Dg Chest Port 1 View  12/19/2015  CLINICAL DATA:  75 year old female with increased weakness. Multiple recent falls at home. Initial  encounter. EXAM: PORTABLE CHEST 1 VIEW COMPARISON:  11/18/2015 and earlier. FINDINGS: Portable AP semi upright view at 1006 hours. Left chest cardiac event recorder re- demonstrated. Hiatal hernia suspected. Stable cardiac size and mediastinal contours. Allowing for portable technique, the lungs are clear. No pneumothorax. Bilateral shoulder arthroplasties re - demonstrated. No acute osseous abnormality identified. IMPRESSION: No acute cardiopulmonary abnormality. Electronically Signed   By: Genevie Ann M.D.   On: 12/19/2015 10:15       I have personally reviewed and evaluated these images and lab results as part of my medical decision-making.   EKG Interpretation   Date/Time:  Monday December 19 2015 09:29:57 EST Ventricular Rate:  100 PR Interval:  162 QRS Duration: 76 QT Interval:  333 QTC Calculation: 429 R Axis:   25 Text Interpretation:  Sinus tachycardia Borderline low voltage, extremity  leads No significant change since last tracing Confirmed by Nicholl Onstott  MD,  Lennette Bihari ([id number]) on 12/19/2015 9:52:38 AM      MDM   Iv ns. Labs. Imaging.  Reviewed nursing notes and prior charts for additional history.   Hospitalists service consulted - will admit patient for further medical workup, therapy, and possible placement.       Lajean Saver, MD 12/20/15 [phone]

## 2015-12-19 NOTE — Progress Notes (Signed)
Patient arrived via stretcher from ED.  Patient son at bedside.  Patient disoriented, alert to self only.  Impulsive and forgetful.  Patient belongings placed in cabinets.  Patient placed on telemetry and CCMD notified.  No complaints of pain at this time.  Patient resting.  Vitals obtained, assessed, no acute distress.  Will continue to monitor.  Placed on bed alarm.

## 2015-12-19 NOTE — Progress Notes (Signed)
Spoke with Niece after seeing pt prior to d/c from [employer] of [facility] and home health services in pt belonging bag Discussed admission status, medicare guidelines, dehydration and possible AMS related intake of meds at home or falls Niece aware of private pay for facility placement and community resources like home health, private duty nursing ([facility]) and care patrol to assist with placement needs  Pt voiced being upset with being admitted to hospital Pt asked Cm about being d/c soon "Can I go tomorrow?" ED Cm and ED nursing director spoke with pt about allowing admitting MD to continue to evaluate her with further labs "I want to go home"  Discussed speaking with her niece and resources being provided to attempt to assist her

## 2015-12-19 NOTE — ED Notes (Signed)
Patient has scattered bruising over the skin with a large one to the right buttock and right elbow.

## 2015-12-19 NOTE — ED Notes (Signed)
Patient has 3 bags of belongings, cell phone, purse, medications. All were transported upstairs with RN.

## 2015-12-19 NOTE — ED Notes (Signed)
Bed: WA03 Expected date:  Expected time:  Means of arrival:  Comments: EMS weakness, increased falls

## 2015-12-19 NOTE — ED Notes (Signed)
Social work at bedside. Will obtain urine once consult complete.

## 2015-12-19 NOTE — H&P (Signed)
[facility] History and Physical  Rhonda Davenport [id number] DOB: [birth year] DOA: 12/19/2015  Referring physician: ED physician, Dr. Ashok Cordia PCP: Rhonda Peppers, MD   Chief Complaint: Altered mental status   HPI:  Pt is 75 yo female who was brought to [facility] ED by her son who is paramedic, for evaluation of progressive change in mental status, recurrent falls at home. Please note that son is not present at bedside and granddaughter who is SW here at [facility], explains that pt takes multiple medications at home and it is not very clear what she takes and how often and there for a concern for medications misuse. Pt is unable to provide any information at this time due to somnolence. No reported fevers, chills, no reported chest pain or shortness of breath.   In ED, pt is somnolent but hemodynamically stable, VSS, blood work notable for Cr 1.33 otherwise unremarkable. UA and CXR with no acute findings, CT head with no acute infarction noted. TRH asked to admit for further evaluation. Telemetry bed requested.   Assessment and Plan:  Principal Problem:   Altered mental state - unclear etiology at this time - ? Oversedation on medications including clonazepam, ativan, phenergan - hold sedating medications for now until mental status improves - follow up on UA - provide IVF and monitor clinical response - will need PT/OT once medically more stable   Active Problems:   Acute renal failure (ARF) (HCC) - appears to be pre renal  - provide IVF and repeat BMP In AM  Lovenox SQ for DVT prophylaxis   Radiological Exams on Admission: Ct Head Wo Contrast  12/19/2015  CLINICAL DATA:  Multiple falls.  Head injury. EXAM: CT HEAD WITHOUT CONTRAST TECHNIQUE: Contiguous axial images were obtained from the base of the skull through the vertex without intravenous contrast. COMPARISON:  04/19/2015 FINDINGS: Moderate atrophy with moderate chronic microvascular ischemic change, similar to the prior study.  Negative for acute infarct. Negative for intracranial hemorrhage. Negative for mass or edema. No shift of the midline structures. Negative for skull fracture. Mucosal edema in the paranasal sinuses IMPRESSION: Atrophy and chronic microvascular ischemia.  No acute infarct. Electronically Signed   By: Franchot Gallo M.D.   On: 12/19/2015 10:05   Dg Chest Port 1 View  12/19/2015  CLINICAL DATA:  75 year old female with increased weakness. Multiple recent falls at home. Initial encounter. EXAM: PORTABLE CHEST 1 VIEW COMPARISON:  11/18/2015 and earlier. FINDINGS: Portable AP semi upright view at 1006 hours. Left chest cardiac event recorder re- demonstrated. Hiatal hernia suspected. Stable cardiac size and mediastinal contours. Allowing for portable technique, the lungs are clear. No pneumothorax. Bilateral shoulder arthroplasties re - demonstrated. No acute osseous abnormality identified. IMPRESSION: No acute cardiopulmonary abnormality. Electronically Signed   By: Genevie Ann M.D.   On: 12/19/2015 10:15     Code Status: Full Family Communication: Pt at bedside Disposition Plan: Admit for further evaluation    Rhonda Davenport [facility] [id number]   Review of Systems:  Unable to obtain due to altered mental status     Past Medical History  Diagnosis Date  . Hypertension   . High cholesterol   . 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 GERD (gastroesophageal reflux disease)   . Anemia   . Thyroid disease     Hypothyroidism  . H/O blood clots     pt states had blood clot in her rt arm  . Fall   . 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 Chronic low back pain  Past Surgical History  Procedure Laterality Date  . Joint replacement      TKR, left  . Abdominal hysterectomy    . Appendectomy    . Shoulder surgery      Bilateral  . Carpal tunnel release Bilateral   . Tonsillectomy      Social History:  reports that she quit smoking about 33 years ago. Her smoking use included Cigarettes. She has a 26 pack-year smoking  history. She has never used smokeless tobacco. She reports that she does not drink alcohol or use illicit drugs.  Allergies  Allergen Reactions  . Ibuprofen Itching  . Benztropine Rash  . Codeine Palpitations    Family History  Problem Relation Age of Onset  . Rheum arthritis Mother   . Rheum arthritis Father   . Healthy Brother     Prior to Admission medications   Medication Sig Start Date End Date Taking? Authorizing Provider  albuterol (PROVENTIL) 4 MG tablet Take 4 mg by mouth 3 (three) times daily.   Yes Historical Provider, MD  benzonatate (TESSALON) 100 MG capsule Take 2 capsules (200 mg total) by mouth 2 (two) times daily as needed for cough. 11/18/15  Yes Hannah Muthersbaugh, PA-C  busPIRone (BUSPAR) 10 MG tablet Take 10 mg by mouth 2 (two) times daily.   Yes Historical Provider, MD  clonazePAM (KLONOPIN) 1 MG tablet Take 0.5-1 tablets by mouth See admin instructions. 0.5 tablet twice daily as needed for anxiety and 1 tablet at bedtime. 04/16/15  Yes Historical Provider, MD  clopidogrel (PLAVIX) 75 MG tablet Take 1 tablet by mouth daily. 04/06/15  Yes Historical Provider, MD  ergocalciferol (VITAMIN D2) 50000 UNITS capsule Take 50,000 Units by mouth once a week.    Yes Historical Provider, MD  escitalopram (LEXAPRO) 10 MG tablet Take 10 mg by mouth daily.   Yes Historical Provider, MD  hydrOXYzine (ATARAX/VISTARIL) 25 MG tablet Take 25 mg by mouth 3 (three) times daily as needed for itching.   Yes Historical Provider, MD  levothyroxine (SYNTHROID, LEVOTHROID) 50 MCG tablet Take 50 mcg by mouth daily before breakfast.    Yes Historical Provider, MD  methotrexate (RHEUMATREX) 2.5 MG tablet Take 15 mg by mouth once a week. Caution:Chemotherapy. Protect from light. Takes on Wednesday's   Yes Historical Provider, MD  montelukast (SINGULAIR) 10 MG tablet Take 10 mg by mouth at bedtime.   Yes Historical Provider, MD  omeprazole (PRILOSEC) 20 MG capsule Take 20 mg by mouth daily.   Yes  Historical Provider, MD  potassium chloride SA (K-DUR,KLOR-CON) 20 MEQ tablet Take 20 mEq by mouth 2 (two) times daily.   Yes Historical Provider, MD  pravastatin (PRAVACHOL) 20 MG tablet Take 20 mg by mouth daily.   Yes Historical Provider, MD  promethazine (PHENERGAN) 12.5 MG tablet Take 12.5 mg by mouth every 4 (four) hours as needed for nausea.   Yes Historical Provider, MD  sulfamethoxazole-trimethoprim (BACTRIM DS,SEPTRA DS) 800-160 MG tablet Take 1 tablet by mouth 2 (two) times daily. Started 01/05 for 10 days   Yes Historical Provider, MD  aspirin 81 MG tablet Take 81 mg by mouth at bedtime.    Historical Provider, MD  Difluprednate (DUREZOL OP) Apply 1 drop to eye 3 (three) times daily.    Historical Provider, MD  folic acid (FOLVITE) 1 MG tablet 1 mg daily. 12/04/14   Historical Provider, MD  hydrOXYzine (ATARAX/VISTARIL) 10 MG tablet Take 2.5 tablets (25 mg total) by mouth every 8 (eight) hours as needed  for itching. Patient not taking: Reported on 12/19/2015 04/02/15   Carmin Muskrat, MD  LORazepam (ATIVAN) 1 MG tablet Take 1 tablet (1 mg total) by mouth 3 times/day as needed-between meals & bedtime for anxiety or sleep. Patient not taking: Reported on 12/19/2015 03/29/14   [name], MD  meloxicam (MOBIC) 15 MG tablet Take 1 tablet by mouth every morning. 02/05/15   Historical Provider, MD  metoprolol tartrate (LOPRESSOR) 25 MG tablet Take 1 tablet (25 mg total) by mouth 2 (two) times daily. 03/29/14   [name], MD  omeprazole (PRILOSEC) 40 MG capsule Take 1 capsule (40 mg total) by mouth daily. Patient not taking: Reported on 12/19/2015 12/25/12   Tanda Rockers, MD  ondansetron (ZOFRAN ODT) 4 MG disintegrating tablet 4mg  ODT q4 hours prn nausea/vomit 11/18/15   Hannah Muthersbaugh, PA-C  polyvinyl alcohol (LIQUIFILM TEARS) 1.4 % ophthalmic solution Place 1 drop into both eyes 3 (three) times daily as needed for dry eyes.    Historical Provider, MD  potassium chloride (MICRO-K) 10 MEQ  CR capsule Take 10 mEq by mouth daily. Reported on 12/19/2015 12/16/14   Historical Provider, MD  zolpidem (AMBIEN) 5 MG tablet Take 1 tablet (5 mg total) by mouth at bedtime as needed for sleep. 03/29/14   [name], MD    Physical Exam: Filed Vitals:   12/19/15 0942  BP: 142/88  Pulse: 98  Temp: 98.6 F (37 C)  TempSrc: Oral  Resp: 18  SpO2: 95%    Physical Exam  Constitutional: somnolent but easy to awake, to somnolent to follows any commands or answers questions  HENT: Normocephalic. External right and left ear normal. Dry MM Eyes: Conjunctivae and EOM are normal. PERRLA, no scleral icterus.  Neck: Normal ROM. Neck supple. No JVD. No tracheal deviation. No thyromegaly.  CVS: RRR, no gallops, no carotid bruit.  Pulmonary: Effort and breath sounds normal, diminished breath sounds at bases  Abdominal: Soft. BS +,  no distension, tenderness, rebound or guarding.  Musculoskeletal: No edema and no tenderness.  Lymphadenopathy: No lymphadenopathy noted, cervical, inguinal. Neuro: Alert. Normal reflexes, muscle tone coordination. No cranial nerve deficit. Skin: Skin is warm and dry. No rash noted. Not diaphoretic. No erythema. No pallor.  Psychiatric: unable to assess due to AMS  Labs on Admission:  Basic Metabolic Panel:  Recent Labs Lab 12/19/15 0926  NA 136  K 3.9  CL 104  CO2 22  GLUCOSE 97  BUN 22*  CREATININE 1.33*  CALCIUM 9.9   Liver Function Tests:  Recent Labs Lab 12/19/15 0926  AST 19  ALT 14  ALKPHOS 60  BILITOT 0.6  PROT 7.8  ALBUMIN 4.4   CBC:  Recent Labs Lab 12/19/15 0926  WBC 6.7  HGB 12.2  HCT 37.3  MCV 94.4  PLT 322   Cardiac Enzymes:  Recent Labs Lab 12/19/15 0926  TROPONINI <0.03   EKG: pending   If 7PM-7AM, please contact night-coverage [email or web address] Password [email or web address] 12/19/2015, 11:00 AM

## 2015-12-19 NOTE — ED Notes (Signed)
Patient is from home in [place] where she lives alone. Patient has had numerous falls over the past couple weeks. She was seen at [facility] last week for the same and refused admission for abnormal labs. Patient's son drove from out of town to check on the patient and she has fallen multiple times while he has been visiting. Patient is on blood thinners but not obvious injuries noted or report of hitting her head during these falls.

## 2015-12-19 NOTE — Progress Notes (Signed)
[facility] ED CM reviewed pt notes, labs and H&P  ED CM spoke briefly with pt's niece to get anticipated d/c plan- "rehab" before speaking with Dr Doyle Askew   ED CM spoke with Dr Doyle Askew about admission status ED Cm returned a call to niece LVM about discussion with Dr Doyle Askew

## 2015-12-19 NOTE — Clinical Social Work Note (Signed)
Clinical Social Work Assessment  Patient Details  Name: Rhonda Davenport MRN: [id number] Date of Birth: 1941/10/29  Date of referral:  12/19/15               Reason for consult:                   Permission sought to share information with:    Permission granted to share information::     Name::        Agency::     Relationship::     Contact Information:     Housing/Transportation Living arrangements for the past 2 months:   (Patient from home) Source of Information:  Other (Comment Required) (Patient's brother, Rhonda Davenport) Patient Interpreter Needed:  None Criminal Activity/Legal Involvement Pertinent to Current Situation/Hospitalization:  No - Comment as needed Significant Relationships:  Other(Comment) Rhonda Davenport, brother) Lives with:  Self Do you feel safe going back to the place where you live?    Need for family participation in patient care:     Care giving concerns:  Patient's brother, Rhonda Davenport stated his concern at this time is patient living alone and patient not taking medications. He stated he had been with patient since Friday.     Social Worker assessment / plan: CSW spoke with patient's brother, Rhonda Davenport to obtain information. Patient does reside alone and she has minimal familial support. Brother provided a name and contact number for a cousin:  Rhonda Davenport, [phone].  Employment status:  Retired Forensic scientist:  Medicare PT Recommendations:    Information / Referral to community resources:     Patient/Family's Response to care: Not noted at this time  Patient/Family's Understanding of and Emotional Response to Diagnosis, Current Treatment, and Prognosis:  Not noted at this time  Emotional Assessment Appearance:  Appears stated age Attitude/Demeanor/Rapport:    Affect (typically observed):    Orientation:    Alcohol / Substance use:    Psych involvement (Current and /or in the community):     Discharge Needs  Concerns to be addressed:     Readmission within the last 30 days:    Current discharge risk:    Barriers to Discharge:      Guadelupe Sabin, LCSW 12/19/2015, 12:05 PM

## 2015-12-19 NOTE — Progress Notes (Signed)
CSW met with patient at bedside. CSW obtained information regarding patient from her brother, Rennis Petty [phone]. He stated patient resides alone in [place] and she has no children. He stated patient has had a number of falls and she has difficulty walking. He stated patient was brought in by ambulance. He stated patient has a walker that she does not utilize as often. He stated patient does not have a great amount of family support. However, he stated patient does have a cousin, Sherrie Sport [phone].  He stated his main concern is that patient resides alone and patient is not taking her medications properly. Brother noted no questions for CSW at this time.  CSW staffed with nurse. Nurse noted patient will be going to inpatient.   Genice Rouge [phone] ED CSW 12/19/2015 12:35 PM

## 2015-12-19 NOTE — ED Notes (Signed)
Nurse Caryl Pina will call back for report.

## 2015-12-20 DIAGNOSIS — R4182 Altered mental status, unspecified: Secondary | ICD-10-CM | POA: Diagnosis not present

## 2015-12-20 DIAGNOSIS — N179 Acute kidney failure, unspecified: Secondary | ICD-10-CM | POA: Diagnosis not present

## 2015-12-20 DIAGNOSIS — R400 Somnolence: Secondary | ICD-10-CM | POA: Diagnosis not present

## 2015-12-20 LAB — BASIC METABOLIC PANEL
ANION GAP: 9 (ref 5–15)
BUN: 26 mg/dL — ABNORMAL HIGH (ref 6–20)
CO2: 20 mmol/L — ABNORMAL LOW (ref 22–32)
Calcium: 9.5 mg/dL (ref 8.9–10.3)
Chloride: 109 mmol/L (ref 101–111)
Creatinine, Ser: 1.19 mg/dL — ABNORMAL HIGH (ref 0.44–1.00)
GFR calc Af Amer: 50 mL/min — ABNORMAL LOW (ref >60)
GFR, EST NON AFRICAN AMERICAN: 44 mL/min — AB (ref >60)
GLUCOSE: 94 mg/dL (ref 65–99)
POTASSIUM: 4.1 mmol/L (ref 3.5–5.1)
Sodium: 138 mmol/L (ref 135–145)

## 2015-12-20 LAB — C DIFFICILE QUICK SCREEN W PCR REFLEX
C Diff antigen: NEGATIVE
C Diff interpretation: NEGATIVE
C Diff toxin: NEGATIVE

## 2015-12-20 LAB — CBC
HEMATOCRIT: 37.4 % (ref 36.0–46.0)
HEMOGLOBIN: 12.2 g/dL (ref 12.0–15.0)
MCH: 30.7 pg (ref 26.0–34.0)
MCHC: 32.6 g/dL (ref 30.0–36.0)
MCV: 94.2 fL (ref 78.0–100.0)
Platelets: 319 10*3/uL (ref 150–400)
RBC: 3.97 MIL/uL (ref 3.87–5.11)
RDW: 12.5 % (ref 11.5–15.5)
WBC: 6 10*3/uL (ref 4.0–10.5)

## 2015-12-20 LAB — TSH: TSH: 0.404 u[IU]/mL (ref 0.350–4.500)

## 2015-12-20 MED ORDER — HYDROMORPHONE HCL 2 MG PO TABS: 2.0000 mg | ORAL_TABLET | Freq: Four times a day (QID) | ORAL | Status: DC | PRN

## 2015-12-20 NOTE — Discharge Summary (Signed)
Physician Discharge Summary  Rhonda Davenport [id number] DOB: 1941-04-09 DOA: 12/19/2015  PCP: Maylon Peppers, MD  Admit date: 12/19/2015 Discharge date: 12/20/2015  Recommendations for Outpatient Follow-up:  1. Pt will need to follow up with PCP in 2-3 weeks post discharge 2. Please obtain BMP to evaluate electrolytes and kidney function 3. Please also check CBC to evaluate Hg and Hct levels  Discharge Diagnoses:  Principal Problem:   Altered mental state Active Problems:   Acute renal failure (ARF) (HCC)   Altered mental status  Discharge Condition: Stable  Diet recommendation: Heart healthy diet discussed in details    HPI:  Pt is 75 yo female who was brought to [facility] ED by her son who is paramedic, for evaluation of progressive change in mental status, recurrent falls at home. Please note that son is not present at bedside and granddaughter who is SW here at [facility], explains that pt takes multiple medications at home and it is not very clear what she takes and how often and there for a concern for medications misuse. Pt is unable to provide any information at this time due to somnolence. No reported fevers, chills, no reported chest pain or shortness of breath.   In ED, pt is somnolent but hemodynamically stable, VSS, blood work notable for Cr 1.33 otherwise unremarkable. UA and CXR with no acute findings, CT head with no acute infarction noted. TRH asked to admit for further evaluation. Telemetry bed requested.   Assessment and Plan:  Principal Problem:  Altered mental state - ? Oversedation on medications including clonazepam, ativan, phenergan - advised on proper use on medications  - mental status clear, pt wants to go home   Active Problems:  Acute renal failure (ARF) (HCC) - appears to be pre renal  - provided IVF and Cr stable   Radiological Exams on Admission:  Imaging Results (Last 48 hours)    Ct Head Wo Contrast  12/19/2015 CLINICAL DATA: Multiple falls. Head  injury. EXAM: CT HEAD WITHOUT CONTRAST TECHNIQUE: Contiguous axial images were obtained from the base of the skull through the vertex without intravenous contrast. COMPARISON: 04/19/2015 FINDINGS: Moderate atrophy with moderate chronic microvascular ischemic change, similar to the prior study. Negative for acute infarct. Negative for intracranial hemorrhage. Negative for mass or edema. No shift of the midline structures. Negative for skull fracture. Mucosal edema in the paranasal sinuses IMPRESSION: Atrophy and chronic microvascular ischemia. No acute infarct. Electronically Signed By: Franchot Gallo M.D. On: 12/19/2015 10:05   Dg Chest Port 1 View  12/19/2015 CLINICAL DATA: 75 year old female with increased weakness. Multiple recent falls at home. Initial encounter. EXAM: PORTABLE CHEST 1 VIEW COMPARISON: 11/18/2015 and earlier. FINDINGS: Portable AP semi upright view at 1006 hours. Left chest cardiac event recorder re- demonstrated. Hiatal hernia suspected. Stable cardiac size and mediastinal contours. Allowing for portable technique, the lungs are clear. No pneumothorax. Bilateral shoulder arthroplasties re - demonstrated. No acute osseous abnormality identified. IMPRESSION: No acute cardiopulmonary abnormality. Electronically Signed By: Genevie Ann M.D. On: 12/19/2015 10:15            Discharge Exam: Filed Vitals:   12/19/15 2125 12/20/15 0458  BP: 139/81 132/72  Pulse: 85 73  Temp: 97.7 F (36.5 C) 97.8 F (36.6 C)  Resp: 16 20   Filed Vitals:   12/19/15 1239 12/19/15 1316 12/19/15 2125 12/20/15 0458  BP: 127/88 137/81 139/81 132/72  Pulse: 106 107 85 73  Temp:  98.1 F (36.7 C) 97.7 F (36.5 C) 97.8 F (36.6  C)  TempSrc:  Oral Oral Oral  Resp: 20 20 16 20   SpO2: 95% 98% 100% 98%    General: Pt is alert, follows commands appropriately, not in acute distress Cardiovascular: Regular rate and rhythm, S1/S2 +, no murmurs, no rubs, no gallops Respiratory: Clear to  auscultation bilaterally, no wheezing, no crackles, no rhonchi Abdominal: Soft, non tender, non distended, bowel sounds +, no guarding Extremities: no edema, no cyanosis, pulses palpable bilaterally DP and PT Neuro: Grossly nonfocal  Discharge Instructions  Discharge Instructions    Diet - low sodium heart healthy    Complete by:  As directed      Increase activity slowly    Complete by:  As directed             Medication List    STOP taking these medications        chlorpheniramine-HYDROcodone 10-8 MG/5ML Suer  Commonly known as:  TUSSIONEX     LORazepam 1 MG tablet  Commonly known as:  ATIVAN      TAKE these medications        albuterol 4 MG tablet  Commonly known as:  PROVENTIL  Take 4 mg by mouth 3 (three) times daily.     aspirin 81 MG tablet  Take 81 mg by mouth at bedtime.     benzonatate 100 MG capsule  Commonly known as:  TESSALON  Take 2 capsules (200 mg total) by mouth 2 (two) times daily as needed for cough.     busPIRone 10 MG tablet  Commonly known as:  BUSPAR  Take 10 mg by mouth 2 (two) times daily.     clonazePAM 1 MG tablet  Commonly known as:  KLONOPIN  Take 0.5-1 tablets by mouth See admin instructions. 0.5 tablet twice daily as needed for anxiety and 1 tablet at bedtime.     clopidogrel 75 MG tablet  Commonly known as:  PLAVIX  Take 1 tablet by mouth daily.     DULoxetine 30 MG capsule  Commonly known as:  CYMBALTA  Take 30 mg by mouth daily.     DUREZOL OP  Apply 1 drop to eye 3 (three) times daily.     ergocalciferol 50000 units capsule  Commonly known as:  VITAMIN D2  Take 50,000 Units by mouth every Thursday.     escitalopram 10 MG tablet  Commonly known as:  LEXAPRO  Take 10 mg by mouth daily.     folic acid 1 MG tablet  Commonly known as:  FOLVITE  1 mg daily.     HYDROmorphone 2 MG tablet  Commonly known as:  DILAUDID  Take 1 tablet (2 mg total) by mouth every 6 (six) hours as needed (for cough and pain).      hydrOXYzine 25 MG tablet  Commonly known as:  ATARAX/VISTARIL  Take 25 mg by mouth 3 (three) times daily as needed for itching.     levothyroxine 50 MCG tablet  Commonly known as:  SYNTHROID, LEVOTHROID  Take 50 mcg by mouth daily before breakfast.     losartan 50 MG tablet  Commonly known as:  COZAAR  Take 50 mg by mouth daily.     meloxicam 15 MG tablet  Commonly known as:  MOBIC  Take 1 tablet by mouth every morning.     methotrexate 2.5 MG tablet  Commonly known as:  RHEUMATREX  Take 15 mg by mouth once a week. Caution:Chemotherapy. Protect from light. Takes on Wednesday's  metoprolol tartrate 25 MG tablet  Commonly known as:  LOPRESSOR  Take 1 tablet (25 mg total) by mouth 2 (two) times daily.     montelukast 10 MG tablet  Commonly known as:  SINGULAIR  Take 10 mg by mouth at bedtime.     MYRBETRIQ 50 MG Tb24 tablet  Generic drug:  mirabegron ER  Take 50 mg by mouth daily.     omeprazole 20 MG capsule  Commonly known as:  PRILOSEC  Take 20 mg by mouth daily.     ondansetron 4 MG disintegrating tablet  Commonly known as:  ZOFRAN ODT  4mg  ODT q4 hours prn nausea/vomit     phentermine 37.5 MG tablet  Commonly known as:  ADIPEX-P  Take 1 tablet by mouth daily with breakfast.     polyvinyl alcohol 1.4 % ophthalmic solution  Commonly known as:  LIQUIFILM TEARS  Place 1 drop into both eyes 3 (three) times daily as needed for dry eyes.     potassium chloride SA 20 MEQ tablet  Commonly known as:  K-DUR,KLOR-CON  Take 20 mEq by mouth 2 (two) times daily.     pravastatin 20 MG tablet  Commonly known as:  PRAVACHOL  Take 20 mg by mouth daily.     promethazine 12.5 MG tablet  Commonly known as:  PHENERGAN  Take 12.5 mg by mouth every 4 (four) hours as needed for nausea.     sulfamethoxazole-trimethoprim 800-160 MG tablet  Commonly known as:  BACTRIM DS,SEPTRA DS  Take 1 tablet by mouth 2 (two) times daily. Started 01/05 for 10 days     zolpidem 5 MG tablet   Commonly known as:  AMBIEN  Take 1 tablet (5 mg total) by mouth at bedtime as needed for sleep.            Follow-up Information    Follow up with Maylon Peppers, MD.   Specialty:  Family Medicine   Contact information:   [place] [address] [phone]        The results of significant diagnostics from this hospitalization (including imaging, microbiology, ancillary and laboratory) are listed below for reference.     Microbiology: Recent Results (from the past 240 hour(s))  C difficile quick scan w PCR reflex     Status: None   Collection Time: 12/19/15 11:50 PM  Result Value Ref Range Status   C Diff antigen NEGATIVE NEGATIVE Final   C Diff toxin NEGATIVE NEGATIVE Final   C Diff interpretation Negative for toxigenic C. difficile  Final     Labs: Basic Metabolic Panel:  Recent Labs Lab 12/19/15 0926 12/20/15 0455  NA 136 138  K 3.9 4.1  CL 104 109  CO2 22 20*  GLUCOSE 97 94  BUN 22* 26*  CREATININE 1.33* 1.19*  CALCIUM 9.9 9.5   Liver Function Tests:  Recent Labs Lab 12/19/15 0926  AST 19  ALT 14  ALKPHOS 60  BILITOT 0.6  PROT 7.8  ALBUMIN 4.4   No results for input(s): LIPASE, AMYLASE in the last 168 hours. No results for input(s): AMMONIA in the last 168 hours. CBC:  Recent Labs Lab 12/19/15 0926 12/20/15 0455  WBC 6.7 6.0  HGB 12.2 12.2  HCT 37.3 37.4  MCV 94.4 94.2  PLT 322 319   Cardiac Enzymes:  Recent Labs Lab 12/19/15 0926  TROPONINI <0.03   BNP: BNP (last 3 results) No results for input(s): BNP in the last 8760  hours.  ProBNP (last 3 results) No results for input(s): PROBNP in the last 8760 hours.  CBG: No results for input(s): GLUCAP in the last 168 hours.   SIGNED: Time coordinating discharge: 30 minutes  Faye Ramsay, MD  [facility] 12/20/2015, 10:45 AM Pager [phone]  If 7PM-7AM, please contact night-coverage [email or web address] Password  [email or web address]

## 2015-12-20 NOTE — Care Management Note (Signed)
Case Management Note  Patient Details  Name: Rhonda Davenport MRN: [id number] Date of Birth: Jun 07, 1941  Subjective/Objective:  Admitted with altered mental state, unclear etiology at this time                  Action/Plan: Discharge planning, spoke with patient at bedside. Have chosen [facility]. Contacted rep for referral. Has RW and wheelchair at home. Per patient family is arranging private care, 2 people will be assisting patient at home and one is a nurse who will manage meds for the patient.   Expected Discharge Date:   (UNKNOWN)               Expected Discharge Plan:  [place]  In-House Referral:  Clinical Social Work  Discharge planning Services  CM Consult  Post Acute Care Choice:  Home Health Choice offered to:  Patient  DME Arranged:  N/A DME Agency:  NA  HH Arranged:  PT [place] Agency:  [facility]  Status of Service:  Completed, signed off  Medicare Important Message Given:    Date Medicare IM Given:    Medicare IM give by:    Date Additional Medicare IM Given:    Additional Medicare Important Message give by:     If discussed at [place] of Stay Meetings, dates discussed:    Additional Comments:  Guadalupe Maple, RN 12/20/2015, 3:03 PM

## 2015-12-20 NOTE — Discharge Instructions (Signed)
Confusion Confusion is the inability to think with your usual speed or clarity. Confusion may come on quickly or slowly over time. How quickly the confusion comes on depends on the cause. Confusion can be due to any number of causes. CAUSES   Concussion, head injury, or head trauma.  Seizures.  Stroke.  Fever.  Brain tumor.  Age related decreased brain function (dementia).  Heightened emotional states like rage or terror.  Mental illness in which the person loses the ability to determine what is real and what is not (hallucinations).  Infections such as a urinary tract infection (UTI).  Toxic effects from alcohol, drugs, or prescription medicines.  Dehydration and an imbalance of salts in the body (electrolytes).  Lack of sleep.  Low blood sugar (diabetes).  Low levels of oxygen from conditions such as chronic lung disorders.  Drug interactions or other medicine side effects.  Nutritional deficiencies, especially niacin, thiamine, vitamin C, or vitamin B.  Sudden drop in body temperature (hypothermia).  Change in routine, such as when traveling or hospitalized. SIGNS AND SYMPTOMS  People often describe their thinking as cloudy or unclear when they are confused. Confusion can also include feeling disoriented. That means you are unaware of where or who you are. You may also not know what the date or time is. If confused, you may also have difficulty paying attention, remembering, and making decisions. Some people also act aggressively when they are confused.  DIAGNOSIS  The medical evaluation of confusion may include:  Blood and urine tests.  X-rays.  Brain and nervous system tests.  Analyzing your brain waves (electroencephalogram or EEG).  Magnetic resonance imaging (MRI) of your head.  Computed tomography (CT) scan of your head.  Mental status tests in which your health care provider may ask many questions. Some of these questions may seem silly or strange,  but they are a very important test to help diagnose and treat confusion. TREATMENT  An admission to the hospital may not be needed, but a person with confusion should not be left alone. Stay with a family member or friend until the confusion clears. Avoid alcohol, pain relievers, or sedative drugs until you have fully recovered. Do not drive until directed by your health care provider. HOME CARE INSTRUCTIONS  What family and friends can do:  To find out if someone is confused, ask the person to state his or her name, age, and the date. If the person is unsure or answers incorrectly, he or she is confused.  Always introduce yourself, no matter how well the person knows you.  Often remind the person of his or her location.  Place a calendar and clock near the confused person.  Help the person with his or her medicines. You may want to use a pill box, an alarm as a reminder, or give the person each dose as prescribed.  Talk about current events and plans for the day.  Try to keep the environment calm, quiet, and peaceful.  Make sure the person keeps follow-up visits with his or her health care provider. PREVENTION  Ways to prevent confusion:  Avoid alcohol.  Eat a balanced diet.  Get enough sleep.  Take medicine only as directed by your health care provider.  Do not become isolated. Spend time with other people and make plans for your days.  Keep careful watch on your blood sugar levels if you are diabetic. SEEK IMMEDIATE MEDICAL CARE IF:   You develop severe headaches, repeated vomiting, seizures, blackouts, or   slurred speech.  There is increasing confusion, weakness, numbness, restlessness, or personality changes.  You develop a loss of balance, have marked dizziness, feel uncoordinated, or fall.  You have delusions, hallucinations, or develop severe anxiety.  Your family members think you need to be rechecked.   This information is not intended to replace advice given  to you by your health care provider. Make sure you discuss any questions you have with your health care provider.   Document Released: 01/03/2005 Document Revised: 12/17/2014 Document Reviewed: 01/01/2014 Elsevier Interactive Patient Education 2016 [employer]vier Inc.  

## 2015-12-20 NOTE — Evaluation (Signed)
Physical Therapy Evaluation Patient Details Name: Rhonda Davenport MRN: [id number] DOB: June 12, 1941 Today's Date: 12/20/2015   History of Present Illness  75 yo female admitted with AMS, falls. Hx of HTN, gait d/o, chronic LBP.   Clinical Impression  On eval, pt required Min assist for mobility-walked ~125 feet with RW. Decreased safety awareness noted throughout session. Max multimodal cueing for safe use of RW. Walker that pt has is actually too fall for her-needs youth height walker. Discussed d/c plan-pt refuses SNF placement. Recommend HHPT and 24 hour supervision/assist.     Follow Up Recommendations SNF;Supervision/Assistance - 24 hour (pt refusing SNF so HHPT and 24 hour supervision)    Equipment Recommendations   (RW that pt has is too fall for her. Needs youth height walker)    Recommendations for Other Services       Precautions / Restrictions Precautions Precautions: Fall Restrictions Weight Bearing Restrictions: No      Mobility  Bed Mobility Overal bed mobility: Needs Assistance Bed Mobility: Supine to Sit;Sit to Supine     Supine to sit: Min guard Sit to supine: Min guard   General bed mobility comments: Increased time and effort. close guard for safety.   Transfers Overall transfer level: Needs assistance Equipment used: Rolling walker (2 wheeled) Transfers: Sit to/from Stand Sit to Stand: Min assist         General transfer comment: Assist to stabilize.   Ambulation/Gait Ambulation/Gait assistance: Min assist Ambulation Distance (Feet): 125 Feet Assistive device: Rolling walker (2 wheeled) Gait Pattern/deviations: Step-through pattern;Decreased stride length;Trunk flexed     General Gait Details: Max cues for safety. Pt is very unsafe with RW use. Assist to stabilize and to prevent LOB/fall. Tolerated distance well. Pt keeps RW too far ahead and also does not stay inside frame of walker. Gilford Rile is actually too tall for pt.   Stairs             Wheelchair Mobility    Modified Rankin (Stroke Patients Only)       Balance Overall balance assessment: Needs assistance         Standing balance support: Bilateral upper extremity supported;During functional activity Standing balance-Leahy Scale: Poor                               Pertinent Vitals/Pain Pain Assessment: No/denies pain    Home Living Family/patient expects to be discharged to:: Private residence Living Arrangements: Alone   Type of Home:  (condo) Home Access: Level entry     Home Layout: One level Home Equipment: Environmental consultant - 2 wheels      Prior Function Level of Independence: Independent with assistive device(s)               Hand Dominance        Extremity/Trunk Assessment   Upper Extremity Assessment:  (RA deformities bil UEs)           Lower Extremity Assessment: Generalized weakness      Cervical / Trunk Assessment: Kyphotic  Communication   Communication: No difficulties  Cognition Arousal/Alertness: Awake/alert Behavior During Therapy: WFL for tasks assessed/performed Overall Cognitive Status: Within Functional Limits for tasks assessed Area of Impairment: Safety/judgement         Safety/Judgement: Decreased awareness of safety          General Comments      Exercises        Assessment/Plan    PT  Assessment Patient needs continued PT services  PT Diagnosis Difficulty walking   PT Problem List Decreased balance;Decreased mobility;Decreased knowledge of use of DME;Decreased safety awareness  PT Treatment Interventions Gait training;Functional mobility training;Therapeutic activities;Patient/family education;Balance training;Therapeutic exercise;DME instruction   PT Goals (Current goals can be found in the Care Plan section) Acute Rehab PT Goals Patient Stated Goal: home! PT Goal Formulation: With patient Time For Goal Achievement: 01/03/16 Potential to Achieve Goals: Good    Frequency Min  3X/week   Barriers to discharge        Co-evaluation               End of Session Equipment Utilized During Treatment: Gait belt Activity Tolerance: Patient tolerated treatment well Patient left: in bed;with call bell/phone within reach      Functional Assessment Tool Used: clinical judgement Functional Limitation: Mobility: Walking and moving around Mobility: Walking and Moving Around Current Status [id number]): At least 20 percent but less than 40 percent impaired, limited or restricted Mobility: Walking and Moving Around Goal Status [phone]): At least 1 percent but less than 20 percent impaired, limited or restricted    Time: 1105-1131 PT Time Calculation (min) (ACUTE ONLY): 26 min   Charges:   PT Evaluation $PT Eval Moderate Complexity: 1 Procedure PT Treatments $Gait Training: 8-22 mins   PT G Codes:   PT G-Codes **NOT FOR INPATIENT CLASS** Functional Assessment Tool Used: clinical judgement Functional Limitation: Mobility: Walking and moving around Mobility: Walking and Moving Around Current Status [id number]): At least 20 percent but less than 40 percent impaired, limited or restricted Mobility: Walking and Moving Around Goal Status [phone]): At least 1 percent but less than 20 percent impaired, limited or restricted    Weston Anna, MPT Pager: [phone]

## 2015-12-20 NOTE — Progress Notes (Signed)
Mikalah L Thon to be D/C'd Home with HHPT per MD order.  Discussed prescriptions and follow up appointments with the patient. Prescriptions given to patient, medication list explained in detail. Pt verbalized understanding.    Medication List    STOP taking these medications        chlorpheniramine-HYDROcodone 10-8 MG/5ML Suer  Commonly known as:  TUSSIONEX     LORazepam 1 MG tablet  Commonly known as:  ATIVAN      TAKE these medications        albuterol 4 MG tablet  Commonly known as:  PROVENTIL  Take 4 mg by mouth 3 (three) times daily.     aspirin 81 MG tablet  Take 81 mg by mouth at bedtime.     benzonatate 100 MG capsule  Commonly known as:  TESSALON  Take 2 capsules (200 mg total) by mouth 2 (two) times daily as needed for cough.     busPIRone 10 MG tablet  Commonly known as:  BUSPAR  Take 10 mg by mouth 2 (two) times daily.     clonazePAM 1 MG tablet  Commonly known as:  KLONOPIN  Take 0.5-1 tablets by mouth See admin instructions. 0.5 tablet twice daily as needed for anxiety and 1 tablet at bedtime.     clopidogrel 75 MG tablet  Commonly known as:  PLAVIX  Take 1 tablet by mouth daily.     DULoxetine 30 MG capsule  Commonly known as:  CYMBALTA  Take 30 mg by mouth daily.     DUREZOL OP  Apply 1 drop to eye 3 (three) times daily.     ergocalciferol 50000 units capsule  Commonly known as:  VITAMIN D2  Take 50,000 Units by mouth once a week.     escitalopram 10 MG tablet  Commonly known as:  LEXAPRO  Take 10 mg by mouth daily.     folic acid 1 MG tablet  Commonly known as:  FOLVITE  1 mg daily.     HYDROmorphone 2 MG tablet  Commonly known as:  DILAUDID  Take 1 tablet (2 mg total) by mouth every 6 (six) hours as needed (for cough and pain).     hydrOXYzine 25 MG tablet  Commonly known as:  ATARAX/VISTARIL  Take 25 mg by mouth 3 (three) times daily as needed for itching.     levothyroxine 50 MCG tablet  Commonly known as:  SYNTHROID, LEVOTHROID   Take 50 mcg by mouth daily before breakfast.     losartan 50 MG tablet  Commonly known as:  COZAAR  Take 50 mg by mouth daily.     meloxicam 15 MG tablet  Commonly known as:  MOBIC  Take 1 tablet by mouth every morning.     methotrexate 2.5 MG tablet  Commonly known as:  RHEUMATREX  Take 15 mg by mouth once a week. Caution:Chemotherapy. Protect from light. Takes on Wednesday's     metoprolol tartrate 25 MG tablet  Commonly known as:  LOPRESSOR  Take 1 tablet (25 mg total) by mouth 2 (two) times daily.     montelukast 10 MG tablet  Commonly known as:  SINGULAIR  Take 10 mg by mouth at bedtime.     MYRBETRIQ 50 MG Tb24 tablet  Generic drug:  mirabegron ER  Take 50 mg by mouth daily.     omeprazole 20 MG capsule  Commonly known as:  PRILOSEC  Take 20 mg by mouth daily.     ondansetron 4 MG  disintegrating tablet  Commonly known as:  ZOFRAN ODT  4mg  ODT q4 hours prn nausea/vomit     phentermine 37.5 MG tablet  Commonly known as:  ADIPEX-P  Take 1 tablet by mouth daily with breakfast.     polyvinyl alcohol 1.4 % ophthalmic solution  Commonly known as:  LIQUIFILM TEARS  Place 1 drop into both eyes 3 (three) times daily as needed for dry eyes.     potassium chloride SA 20 MEQ tablet  Commonly known as:  K-DUR,KLOR-CON  Take 20 mEq by mouth 2 (two) times daily.     pravastatin 20 MG tablet  Commonly known as:  PRAVACHOL  Take 20 mg by mouth daily.     promethazine 12.5 MG tablet  Commonly known as:  PHENERGAN  Take 12.5 mg by mouth every 4 (four) hours as needed for nausea.     sulfamethoxazole-trimethoprim 800-160 MG tablet  Commonly known as:  BACTRIM DS,SEPTRA DS  Take 1 tablet by mouth 2 (two) times daily. Started 01/05 for 10 days     zolpidem 5 MG tablet  Commonly known as:  AMBIEN  Take 1 tablet (5 mg total) by mouth at bedtime as needed for sleep.        Filed Vitals:   12/19/15 2125 12/20/15 0458  BP: 139/81 132/72  Pulse: 85 73  Temp: 97.7 F  (36.5 C) 97.8 F (36.6 C)  Resp: 16 20    Skin clean, dry and intact without evidence of skin break down, no evidence of skin tears noted. IV catheter discontinued intact. Site without signs and symptoms of complications. Dressing and pressure applied. Pt denies pain at this time. No complaints noted.  An After Visit Summary was printed and given to the patient. Patient escorted via [place], and D/C home via private auto.  Nonie Hoyer S 12/20/2015 2:23 PM

## 2015-12-21 ENCOUNTER — Encounter (HOSPITAL_COMMUNITY): Payer: Self-pay | Admitting: Emergency Medicine

## 2015-12-21 ENCOUNTER — Emergency Department (HOSPITAL_COMMUNITY): Payer: Medicare Other

## 2015-12-21 ENCOUNTER — Emergency Department (HOSPITAL_COMMUNITY)
Admission: EM | Admit: 2015-12-21 | Discharge: 2015-12-26 | Disposition: A | Discharge status: Other Acute Inpatient Hospital | Payer: Medicare Other | Attending: Emergency Medicine | Admitting: Emergency Medicine

## 2015-12-21 DIAGNOSIS — W19XXXA Unspecified fall, initial encounter: Secondary | ICD-10-CM

## 2015-12-21 DIAGNOSIS — Z7982 Long term (current) use of aspirin: Secondary | ICD-10-CM | POA: Diagnosis not present

## 2015-12-21 DIAGNOSIS — F1310 Sedative, hypnotic or anxiolytic abuse, uncomplicated: Secondary | ICD-10-CM | POA: Diagnosis not present

## 2015-12-21 DIAGNOSIS — Z7902 Long term (current) use of antithrombotics/antiplatelets: Secondary | ICD-10-CM | POA: Diagnosis not present

## 2015-12-21 DIAGNOSIS — E039 Hypothyroidism, unspecified: Secondary | ICD-10-CM | POA: Insufficient documentation

## 2015-12-21 DIAGNOSIS — R410 Disorientation, unspecified: Secondary | ICD-10-CM | POA: Diagnosis present

## 2015-12-21 DIAGNOSIS — Z79899 Other long term (current) drug therapy: Secondary | ICD-10-CM | POA: Diagnosis not present

## 2015-12-21 DIAGNOSIS — Z87891 Personal history of nicotine dependence: Secondary | ICD-10-CM | POA: Insufficient documentation

## 2015-12-21 DIAGNOSIS — W000XXA Fall on same level due to ice and snow, initial encounter: Secondary | ICD-10-CM | POA: Diagnosis not present

## 2015-12-21 DIAGNOSIS — F39 Unspecified mood [affective] disorder: Secondary | ICD-10-CM | POA: Diagnosis present

## 2015-12-21 DIAGNOSIS — G8929 Other chronic pain: Secondary | ICD-10-CM | POA: Insufficient documentation

## 2015-12-21 DIAGNOSIS — Y9301 Activity, walking, marching and hiking: Secondary | ICD-10-CM | POA: Diagnosis not present

## 2015-12-21 DIAGNOSIS — Z9114 Patient's other noncompliance with medication regimen: Secondary | ICD-10-CM

## 2015-12-21 DIAGNOSIS — K219 Gastro-esophageal reflux disease without esophagitis: Secondary | ICD-10-CM | POA: Diagnosis not present

## 2015-12-21 DIAGNOSIS — Y998 Other external cause status: Secondary | ICD-10-CM | POA: Diagnosis not present

## 2015-12-21 DIAGNOSIS — Z86718 Personal history of other venous thrombosis and embolism: Secondary | ICD-10-CM | POA: Diagnosis not present

## 2015-12-21 DIAGNOSIS — F19988 Other psychoactive substance use, unspecified with other psychoactive substance-induced disorder: Secondary | ICD-10-CM

## 2015-12-21 DIAGNOSIS — T148 Other injury of unspecified body region: Secondary | ICD-10-CM | POA: Diagnosis not present

## 2015-12-21 DIAGNOSIS — Z45018 Encounter for adjustment and management of other part of cardiac pacemaker: Secondary | ICD-10-CM

## 2015-12-21 DIAGNOSIS — Z8739 Personal history of other diseases of the musculoskeletal system and connective tissue: Secondary | ICD-10-CM | POA: Insufficient documentation

## 2015-12-21 DIAGNOSIS — D649 Anemia, unspecified: Secondary | ICD-10-CM | POA: Diagnosis not present

## 2015-12-21 DIAGNOSIS — Z791 Long term (current) use of non-steroidal anti-inflammatories (NSAID): Secondary | ICD-10-CM | POA: Insufficient documentation

## 2015-12-21 DIAGNOSIS — I10 Essential (primary) hypertension: Secondary | ICD-10-CM | POA: Insufficient documentation

## 2015-12-21 DIAGNOSIS — Z01818 Encounter for other preprocedural examination: Secondary | ICD-10-CM

## 2015-12-21 DIAGNOSIS — Y92093 Driveway of other non-institutional residence as the place of occurrence of the external cause: Secondary | ICD-10-CM | POA: Insufficient documentation

## 2015-12-21 DIAGNOSIS — T50905A Adverse effect of unspecified drugs, medicaments and biological substances, initial encounter: Secondary | ICD-10-CM | POA: Diagnosis present

## 2015-12-21 DIAGNOSIS — T50901A Poisoning by unspecified drugs, medicaments and biological substances, accidental (unintentional), initial encounter: Secondary | ICD-10-CM | POA: Diagnosis present

## 2015-12-21 DIAGNOSIS — Z049 Encounter for examination and observation for unspecified reason: Secondary | ICD-10-CM

## 2015-12-21 DIAGNOSIS — E7800 Pure hypercholesterolemia, unspecified: Secondary | ICD-10-CM | POA: Diagnosis not present

## 2015-12-21 DIAGNOSIS — Z91148 Patient's other noncompliance with medication regimen for other reason: Secondary | ICD-10-CM

## 2015-12-21 DIAGNOSIS — Z043 Encounter for examination and observation following other accident: Secondary | ICD-10-CM | POA: Diagnosis present

## 2015-12-21 DIAGNOSIS — T148XXA Other injury of unspecified body region, initial encounter: Secondary | ICD-10-CM

## 2015-12-21 LAB — URINALYSIS, ROUTINE W REFLEX MICROSCOPIC
Bilirubin Urine: NEGATIVE
GLUCOSE, UA: NEGATIVE mg/dL
Ketones, ur: NEGATIVE mg/dL
LEUKOCYTES UA: NEGATIVE
NITRITE: NEGATIVE
PROTEIN: NEGATIVE mg/dL
Specific Gravity, Urine: 1.021 (ref 1.005–1.030)
pH: 5.5 (ref 5.0–8.0)

## 2015-12-21 LAB — URINE MICROSCOPIC-ADD ON

## 2015-12-21 MED ORDER — LORAZEPAM 2 MG/ML IJ SOLN
0.5000 mg | Freq: Once | INTRAMUSCULAR | Status: AC
  Administered 2015-12-21: 0.5 mg via INTRAMUSCULAR
  Filled 2015-12-21: qty 1

## 2015-12-21 MED ORDER — MONTELUKAST SODIUM 10 MG PO TABS
10.0000 mg | ORAL_TABLET | Freq: Every day | ORAL | Status: DC
  Administered 2015-12-22 – 2015-12-25 (×4): 10 mg via ORAL
  Filled 2015-12-21 (×7): qty 1

## 2015-12-21 MED ORDER — POLYVINYL ALCOHOL 1.4 % OP SOLN
1.0000 [drp] | Freq: Three times a day (TID) | OPHTHALMIC | Status: DC | PRN
  Administered 2015-12-23 – 2015-12-25 (×3): 1 [drp] via OPHTHALMIC
  Filled 2015-12-21: qty 15

## 2015-12-21 MED ORDER — DULOXETINE HCL 30 MG PO CPEP
30.0000 mg | ORAL_CAPSULE | Freq: Every day | ORAL | Status: DC
  Administered 2015-12-21 – 2015-12-26 (×6): 30 mg via ORAL
  Filled 2015-12-21 (×7): qty 1

## 2015-12-21 MED ORDER — LEVOTHYROXINE SODIUM 50 MCG PO TABS
50.0000 ug | ORAL_TABLET | Freq: Every day | ORAL | Status: DC
  Administered 2015-12-22 – 2015-12-26 (×5): 50 ug via ORAL
  Filled 2015-12-21 (×6): qty 1

## 2015-12-21 MED ORDER — HYDROXYZINE HCL 25 MG PO TABS
25.0000 mg | ORAL_TABLET | Freq: Three times a day (TID) | ORAL | Status: DC | PRN
  Administered 2015-12-23 (×2): 25 mg via ORAL
  Filled 2015-12-21 (×2): qty 1

## 2015-12-21 MED ORDER — CLOPIDOGREL BISULFATE 75 MG PO TABS
75.0000 mg | ORAL_TABLET | Freq: Every day | ORAL | Status: DC
  Administered 2015-12-21 – 2015-12-26 (×6): 75 mg via ORAL
  Filled 2015-12-21 (×7): qty 1

## 2015-12-21 MED ORDER — VITAMIN D (ERGOCALCIFEROL) 1.25 MG (50000 UNIT) PO CAPS
50000.0000 [IU] | ORAL_CAPSULE | ORAL | Status: DC
  Administered 2015-12-22: 50000 [IU] via ORAL
  Filled 2015-12-21: qty 1

## 2015-12-21 MED ORDER — PHENTERMINE HCL 37.5 MG PO TABS: 37.5000 mg | ORAL_TABLET | Freq: Every day | ORAL | Status: DC

## 2015-12-21 MED ORDER — ESCITALOPRAM OXALATE 10 MG PO TABS
10.0000 mg | ORAL_TABLET | Freq: Every day | ORAL | Status: DC
  Administered 2015-12-21 – 2015-12-22 (×2): 10 mg via ORAL
  Filled 2015-12-21 (×2): qty 1

## 2015-12-21 MED ORDER — LOSARTAN POTASSIUM 50 MG PO TABS
50.0000 mg | ORAL_TABLET | Freq: Every day | ORAL | Status: DC
  Administered 2015-12-22 – 2015-12-26 (×5): 50 mg via ORAL
  Filled 2015-12-21 (×6): qty 1

## 2015-12-21 MED ORDER — PANTOPRAZOLE SODIUM 40 MG PO TBEC
40.0000 mg | DELAYED_RELEASE_TABLET | Freq: Every day | ORAL | Status: DC
  Administered 2015-12-21 – 2015-12-26 (×6): 40 mg via ORAL
  Filled 2015-12-21 (×6): qty 1

## 2015-12-21 MED ORDER — BUSPIRONE HCL 10 MG PO TABS
10.0000 mg | ORAL_TABLET | Freq: Two times a day (BID) | ORAL | Status: DC
  Administered 2015-12-21 – 2015-12-23 (×5): 10 mg via ORAL
  Filled 2015-12-21 (×5): qty 1

## 2015-12-21 MED ORDER — LORAZEPAM 0.5 MG PO TABS: 0.5000 mg | ORAL_TABLET | Freq: Four times a day (QID) | ORAL | Status: DC | PRN

## 2015-12-21 MED ORDER — POTASSIUM CHLORIDE CRYS ER 20 MEQ PO TBCR
20.0000 meq | EXTENDED_RELEASE_TABLET | Freq: Two times a day (BID) | ORAL | Status: DC
  Administered 2015-12-21 – 2015-12-26 (×10): 20 meq via ORAL
  Filled 2015-12-21 (×10): qty 1

## 2015-12-21 MED ORDER — HALOPERIDOL LACTATE 5 MG/ML IJ SOLN
INTRAMUSCULAR | Status: AC
Start: 2015-12-21 — End: 2015-12-22
  Filled 2015-12-21: qty 1

## 2015-12-21 MED ORDER — FOLIC ACID 1 MG PO TABS
1.0000 mg | ORAL_TABLET | Freq: Every day | ORAL | Status: DC
  Administered 2015-12-21 – 2015-12-26 (×6): 1 mg via ORAL
  Filled 2015-12-21 (×7): qty 1

## 2015-12-21 MED ORDER — ACETAMINOPHEN 325 MG PO TABS
650.0000 mg | ORAL_TABLET | ORAL | Status: DC | PRN
  Administered 2015-12-22 – 2015-12-25 (×4): 650 mg via ORAL
  Filled 2015-12-21 (×5): qty 2

## 2015-12-21 MED ORDER — PROMETHAZINE HCL 25 MG PO TABS: 12.5000 mg | ORAL_TABLET | ORAL | Status: DC | PRN

## 2015-12-21 MED ORDER — PRAVASTATIN SODIUM 20 MG PO TABS
20.0000 mg | ORAL_TABLET | Freq: Every day | ORAL | Status: DC
  Administered 2015-12-21 – 2015-12-26 (×7): 20 mg via ORAL
  Filled 2015-12-21 (×8): qty 1

## 2015-12-21 MED ORDER — DIFLUPREDNATE 0.05 % OP EMUL: Freq: Three times a day (TID) | OPHTHALMIC | Status: DC

## 2015-12-21 MED ORDER — HALOPERIDOL LACTATE 5 MG/ML IJ SOLN
3.0000 mg | Freq: Once | INTRAMUSCULAR | Status: AC
  Administered 2015-12-21: 3 mg via INTRAMUSCULAR

## 2015-12-21 NOTE — ED Notes (Signed)
Patient transported to X-ray 

## 2015-12-21 NOTE — ED Notes (Signed)
Delay in medication administration due to pt being asleep and three medications still being sent from pharmacy

## 2015-12-21 NOTE — ED Notes (Signed)
SOCIAL WORK PRESENT SPEAKING WITH THIS PT

## 2015-12-21 NOTE — ED Notes (Signed)
Psych provider came by to assess pt, pt sleeping at this time. arousable but falls back asleep

## 2015-12-21 NOTE — Progress Notes (Signed)
Spoke with Dr Wilson Singer about Cm previous interactions with pt Orders entered in EPIC for [facility] for home safety evaluation, medication management and assessment of other home needs and [facility] staff, updated pcp [place] aide assist with ADLs if needed HHSW to assist with community resources or placement as needed 1029 Cm left a message for mary of well care [phone] about pt ED visit, pending consult & new HH orders Pending return call

## 2015-12-21 NOTE — ED Notes (Signed)
Bed: [id number] Expected date:  Expected time:  Means of arrival:  Comments: Fall, LSB,
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
## 2015-12-21 NOTE — ED Notes (Signed)
Dr Wilson Singer asked for medical Records from [place] record consent sheet to medical records. Left message for someone to call back from medical records from [facility]. Received confirmation fax. No call back from [facility] medical records. Dr Wilson Singer and RN notified

## 2015-12-21 NOTE — Progress Notes (Addendum)
ED CM received a call from [facility] ED charge RN that the pt was noted going through the ambulance bay with only her socks on attempting leave Charge Rn asked pt progression  Cm informed her pt needs to be evaluated by TTS/SAPPU Cm mentioned this to TTS/SAPPU team
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
## 2015-12-21 NOTE — Progress Notes (Signed)
ED CM contacted at 1305 by Stanton Kidney of [facility] to come visit pt in [facility] [place] spoke with Stanton Kidney again to find out she had spoken with pt's niece because pt had been given haldol after pt attempted to leave [facility] ED x 4 CM spoke with TTS staff [place] and pt to be evaluated for geripsychiatrist placement  Pt with 2 ED visits and 1 admission in the last week

## 2015-12-21 NOTE — Progress Notes (Signed)
This pt was setup with  HH Arranged: PT Rhonda Davenport: [facility]  Prior to 12/20/15 [facility] hospital d/c   This Cm spoke with pt when she was in [facility] ED prior to admission Refer to CM notes for 12/19/15 indicating pt did not want to be hospitalized during that time, wanted d/c home.  Cm reviewed HHRN notes indicating pt did not want facility placement

## 2015-12-21 NOTE — Consult Note (Signed)
[facility] Consult   Reason for Consult:  Disorientation, Referring Physician:  EDP Patient Identification: Rhonda Davenport MRN:  [id number] Principal Diagnosis: Unspecified mood (affective) disorder ([place]) Diagnosis:   Patient Active Problem List   Diagnosis Date Noted  . Unspecified mood (affective) disorder ([place]) [F39] 12/21/2015    Priority: High  . Acute renal failure (ARF) ([place]) [N17.9] 12/19/2015  . Altered mental status [R41.82] 12/19/2015  . Gait disorder [R26.9] 01/05/2015  . Leg weakness, bilateral [R29.898] 01/05/2015  . Seizure ([place]) [R56.9] 03/24/2014  . Altered mental state [R41.82] 03/24/2014  . Cough [R05] 12/25/2012    Total Time spent with patient: 30 minutes  Subjective:   Rhonda Davenport is a 75 y.o. female patient admitted with Disorientation  .  HPI:  Caucasian female, 1 years old was brought in by EMS called by her neighbors because patient was found on the floor.  Patient lives alone and per her niece, Vinnie [place], [place] she has had several falls at home.  Niece reported that patient's health has been gradually declining.  Patient has not been able to cloth her self lately.  She is now needing re-orientation because patient is beginning to forget things.  Patient has had erratic behavior and have been seen outside her house without foot wear.  According to her niece she was sleeping more than usual lately.  Patient, today has tried to walk out of the ER and she was on restraint to prevent her from hurting herself and others.  Patient is taking Antidepressants and anxiolytic  prescribed by her outpatient Psychiatrist, DR Matinson.  Patient has been accepted for admission and we will be seeking placement at any unit with available bed. Niece can be reached at [place]  Per patients brother, patient is taking multiple pain medications and he believes may be contributing to her falls. Past Psychiatric History: MDD, Generalized anxiety  disorder.  Risk to Self: Suicidal Ideation: No Suicidal Intent: No Is patient at risk for suicide?: No Suicidal Plan?: No Access to Means: No What has been your use of drugs/alcohol within the last 12 months?: Denies How many times?: 0 Other Self Harm Risks: None Triggers for Past Attempts: Unknown Intentional Self Injurious Behavior: None Risk to Others: Homicidal Ideation: No Thoughts of Harm to Others: No Current Homicidal Intent: No Current Homicidal Plan: No Access to Homicidal Means: No Identified Victim: NA History of harm to others?: No Assessment of Violence: None Noted Violent Behavior Description: NA Does patient have access to weapons?: No Criminal Charges Pending?: No Does patient have a court date: No Prior Inpatient Therapy: Prior Inpatient Therapy: No Prior Therapy Dates: na Prior Therapy Facilty/Provider(s): na Reason for Treatment: na Prior Outpatient Therapy: Prior Outpatient Therapy: No Prior Therapy Dates: na Prior Therapy Facilty/Provider(s): unknown Reason for Treatment: NA Does patient have an ACCT team?: No Does patient have Intensive In-House Services?  : No Does patient have [facility] services? : No Does patient have P4[facility] services?: No  Past Medical History:  Past Medical History  Diagnosis Date  . Hypertension   . High cholesterol   . 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 GERD (gastroesophageal reflux disease)   . Anemia   . Thyroid disease     Hypothyroidism  . H/O blood clots     pt states had blood clot in her rt arm  . Fall   . 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 Chronic low back pain     Past Surgical History  Procedure Laterality Date  .  Joint replacement      TKR, left  . Abdominal hysterectomy    . Appendectomy    . Shoulder surgery      Bilateral  . Carpal tunnel release Bilateral   . Tonsillectomy     Family History:  Family History  Problem Relation Age of Onset  . Rheum arthritis Mother   . Rheum arthritis Father   . Healthy  Brother    Family Psychiatric  History: unknown Social History:  History  Alcohol Use No     History  Drug Use No    Social History   Social History  . Marital Status: Widowed    Spouse Name: N/A  . Number of Children: 0  . Years of Education: N/A   Occupational History  . Retired     Press photographer   Social History Main Topics  . Smoking status: Former Smoker -- 1.00 packs/day for 26 years    Types: Cigarettes    Quit date: 12/10/1982  . Smokeless tobacco: Never Used  . Alcohol Use: No  . Drug Use: No  . Sexual Activity: Not Asked   Other Topics Concern  . None   Social History Narrative   Patient is right handed.   Patient drinks 1 cup caffeine daily   Additional Social History:    Pain Medications: See MAR Prescriptions: See MAR Over the Counter: See MAR History of alcohol / drug use?: No history of alcohol / drug abuse Longest period of sobriety (when/how long): na     Allergies:   Allergies  Allergen Reactions  . Ibuprofen Itching  . Benztropine Rash  . Codeine Palpitations    Labs:  Results for orders placed or performed during the hospital encounter of 12/21/15 (from the past 48 hour(s))  Urinalysis, Routine w reflex microscopic (not at [facility])     Status: Abnormal   Collection Time: 12/21/15 12:49 PM  Result Value Ref Range   Color, Urine YELLOW YELLOW   APPearance CLOUDY (A) CLEAR   Specific Gravity, Urine 1.021 1.005 - 1.030   pH 5.5 5.0 - 8.0   Glucose, UA NEGATIVE NEGATIVE mg/dL   Hgb urine dipstick TRACE (A) NEGATIVE   Bilirubin Urine NEGATIVE NEGATIVE   Ketones, ur NEGATIVE NEGATIVE mg/dL   Protein, ur NEGATIVE NEGATIVE mg/dL   Nitrite NEGATIVE NEGATIVE   Leukocytes, UA NEGATIVE NEGATIVE  Urine microscopic-add on     Status: Abnormal   Collection Time: 12/21/15 12:49 PM  Result Value Ref Range   Squamous Epithelial / LPF 0-5 (A) NONE SEEN   WBC, UA 6-30 0 - 5 WBC/hpf   RBC / HPF 0-5 0 - 5 RBC/hpf   Bacteria, UA RARE (A) NONE SEEN    Urine-Other MUCOUS PRESENT     No current facility-administered medications for this encounter.   Current Outpatient Prescriptions  Medication Sig Dispense Refill  . albuterol (PROVENTIL) 4 MG tablet Take 4 mg by mouth 3 (three) times daily.    Marland Kitchen aspirin 81 MG tablet Take 81 mg by mouth at bedtime.    . benzonatate (TESSALON) 100 MG capsule Take 2 capsules (200 mg total) by mouth 2 (two) times daily as needed for cough. 20 capsule 0  . busPIRone (BUSPAR) 10 MG tablet Take 10 mg by mouth 2 (two) times daily.    . clonazePAM (KLONOPIN) 1 MG tablet Take 0.5-1 tablets by mouth See admin instructions. 0.5 tablet twice daily as needed for anxiety and 1 tablet at bedtime.    Marland Kitchen  clopidogrel (PLAVIX) 75 MG tablet Take 1 tablet by mouth daily.    . Difluprednate (DUREZOL OP) Apply 1 drop to eye 3 (three) times daily.    . DULoxetine (CYMBALTA) 30 MG capsule Take 30 mg by mouth daily.  5  . ergocalciferol (VITAMIN D2) 50000 UNITS capsule Take 50,000 Units by mouth once a week.     . escitalopram (LEXAPRO) 10 MG tablet Take 10 mg by mouth daily.    . folic acid (FOLVITE) 1 MG tablet 1 mg daily.  11  . HYDROmorphone (DILAUDID) 2 MG tablet Take 1 tablet (2 mg total) by mouth every 6 (six) hours as needed (for cough and pain). (Patient not taking: Reported on 12/21/2015)    . hydrOXYzine (ATARAX/VISTARIL) 25 MG tablet Take 25 mg by mouth 3 (three) times daily as needed for itching.    . levothyroxine (SYNTHROID, LEVOTHROID) 50 MCG tablet Take 50 mcg by mouth daily before breakfast.     . losartan (COZAAR) 50 MG tablet Take 50 mg by mouth daily.  6  . meloxicam (MOBIC) 15 MG tablet Take 1 tablet by mouth every morning.    . methotrexate (RHEUMATREX) 2.5 MG tablet Take 15 mg by mouth once a week. Caution:Chemotherapy. Protect from light. Takes on Wednesday's    . metoprolol tartrate (LOPRESSOR) 25 MG tablet Take 1 tablet (25 mg total) by mouth 2 (two) times daily. (Patient not taking: Reported on 12/19/2015) 60  tablet 0  . mirabegron ER (MYRBETRIQ) 50 MG TB24 tablet Take 50 mg by mouth daily.    . montelukast (SINGULAIR) 10 MG tablet Take 10 mg by mouth at bedtime.    Marland Kitchen omeprazole (PRILOSEC) 20 MG capsule Take 20 mg by mouth daily.    . ondansetron (ZOFRAN ODT) 4 MG disintegrating tablet 4mg  ODT q4 hours prn nausea/vomit 4 tablet 0  . phentermine (ADIPEX-P) 37.5 MG tablet Take 1 tablet by mouth daily with breakfast.  3  . polyvinyl alcohol (LIQUIFILM TEARS) 1.4 % ophthalmic solution Place 1 drop into both eyes 3 (three) times daily as needed for dry eyes.    . potassium chloride SA (K-DUR,KLOR-CON) 20 MEQ tablet Take 20 mEq by mouth 2 (two) times daily.    . pravastatin (PRAVACHOL) 20 MG tablet Take 20 mg by mouth daily.    . promethazine (PHENERGAN) 12.5 MG tablet Take 12.5 mg by mouth every 4 (four) hours as needed for nausea.    Marland Kitchen sulfamethoxazole-trimethoprim (BACTRIM DS,SEPTRA DS) 800-160 MG tablet Take 1 tablet by mouth 2 (two) times daily. Started 01/05 for 10 days    . zolpidem (AMBIEN) 5 MG tablet Take 1 tablet (5 mg total) by mouth at bedtime as needed for sleep. (Patient not taking: Reported on 12/19/2015) 30 tablet 0    Musculoskeletal: Strength & Muscle Tone: seen lying down in bed Gait & Station: seen lying in bed Patient leans: see above  Psychiatric Specialty Exam: Review of Systems  Unable to perform ROS: mental acuity    Blood pressure 123/74, pulse 104, temperature 98.3 F (36.8 C), temperature source Oral, resp. rate 20, height 4\' 9"  (1.448 m), weight 49.896 kg (110 lb), SpO2 93 %.Body mass index is 23.8 kg/(m^2).  General Appearance: Casual  Eye Contact::  Minimal  Speech:  unable to obtain  Volume:  Decreased  Mood:  Anxious and Irritable  Affect:  Congruent  Thought Process:  Disorganized  Orientation:  Other:  unable to obtain  Thought Content:  Hallucinations: Visual and wanting to talk to  dead husband  Suicidal Thoughts:  unable to obtain  Homicidal Thoughts:   unable to obtain  Memory:  Immediate;   Poor Recent;   Poor Remote;   Poor  Judgement:  Poor  Insight:  Lacking  Psychomotor Activity:  Psychomotor Retardation  Concentration:  Poor  Recall:  unable to obtain  Fund of Knowledge:Poor  Language: Poor  Akathisia:  No  Handed:  Right  AIMS (if indicated):     Assets:  Others:  unable to obtain  ADL's:  Intact  Cognition: Impaired,  Moderate  Sleep:      Treatment Plan Summary: Daily contact with patient to assess and evaluate symptoms and progress in treatment and Medication management  Disposition: Accepted for admission, we will seek placement at any [facility] with available beds.  Preadmission EKG, Chest Xray has been ordered.  Delfin Gant   PMHNP-BC 12/21/2015 2:27 PM  Patient seen face-to-face for psychiatric evaluation, chart reviewed and case discussed with the physician extender and developed treatment plan. Reviewed the information documented and agree with the treatment plan. Corena Pilgrim, MD

## 2015-12-21 NOTE — ED Provider Notes (Signed)
CSN: [id number]     Arrival date & time 12/21/15  0053 History   First MD Initiated Contact with Patient 12/21/15 0243     Chief Complaint  Patient presents with  . Fall     (Consider location/radiation/quality/duration/timing/severity/associated sxs/prior Treatment) HPI Comments: Pt is 76 yo female who was brought to [facility] ED by ems for evaluation of fall. Pt was just admitted to the hospital for an overnight stay due to concerns for dementia and AMS. Pt was discharged with home health, she refused SNF placement. Pt allegedly fell again. EMS reports that pt fell outside her home due to ice and neighbor called EMS. Pt reports she didn't fall due to ice, but unable to explain the cause of fall either. Spoke with the brother, who has [place] at [phone]. He reports that pt lives alone, she has had progressive decline in mentation, and is not safe to be living by herself. He thinks pt should be in memory care. We discussed the recent admission and things that were addressed. He reports that he would want Psych to see the patient for decision making capacity, since pt refuses everything that they feel is important for well being.   ROS 10 Systems reviewed and are negative for acute change except as noted in the HPI.   The history is provided by the patient and a relative.    Past Medical History  Diagnosis Date  . Hypertension   . High cholesterol   . 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 GERD (gastroesophageal reflux disease)   . Anemia   . Thyroid disease     Hypothyroidism  . H/O blood clots     pt states had blood clot in her rt arm  . Fall   . 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 Chronic low back pain    Past Surgical History  Procedure Laterality Date  . Joint replacement      TKR, left  . Abdominal hysterectomy    . Appendectomy    . Shoulder surgery      Bilateral  . Carpal tunnel release Bilateral   . Tonsillectomy     Family History  Problem Relation Age of Onset  . Rheum arthritis  Mother   . Rheum arthritis Father   . Healthy Brother    Social History  Substance Use Topics  . Smoking status: Former Smoker -- 1.00 packs/day for 26 years    Types: Cigarettes    Quit date: 12/10/1982  . Smokeless tobacco: Never Used  . Alcohol Use: No   OB History    No data available     Review of Systems    Allergies  Ibuprofen; Benztropine; and Codeine  Home Medications   Prior to Admission medications   Medication Sig Start Date End Date Taking? Authorizing Provider  albuterol (PROVENTIL) 4 MG tablet Take 4 mg by mouth 3 (three) times daily.    Historical Provider, MD  aspirin 81 MG tablet Take 81 mg by mouth at bedtime.    Historical Provider, MD  benzonatate (TESSALON) 100 MG capsule Take 2 capsules (200 mg total) by mouth 2 (two) times daily as needed for cough. 11/18/15   Hannah Muthersbaugh, PA-C  busPIRone (BUSPAR) 10 MG tablet Take 10 mg by mouth 2 (two) times daily.    Historical Provider, MD  clonazePAM (KLONOPIN) 1 MG tablet Take 0.5-1 tablets by mouth See admin instructions. 0.5 tablet twice daily as needed for anxiety and 1 tablet at bedtime. 04/16/15  Historical Provider, MD  clopidogrel (PLAVIX) 75 MG tablet Take 1 tablet by mouth daily. 04/06/15   Historical Provider, MD  Difluprednate (DUREZOL OP) Apply 1 drop to eye 3 (three) times daily.    Historical Provider, MD  DULoxetine (CYMBALTA) 30 MG capsule Take 30 mg by mouth daily. 10/06/15   Historical Provider, MD  ergocalciferol (VITAMIN D2) 50000 UNITS capsule Take 50,000 Units by mouth once a week.     Historical Provider, MD  escitalopram (LEXAPRO) 10 MG tablet Take 10 mg by mouth daily.    Historical Provider, MD  folic acid (FOLVITE) 1 MG tablet 1 mg daily. 12/04/14   Historical Provider, MD  HYDROmorphone (DILAUDID) 2 MG tablet Take 1 tablet (2 mg total) by mouth every 6 (six) hours as needed (for cough and pain). 12/20/15   Theodis Blaze, MD  hydrOXYzine (ATARAX/VISTARIL) 25 MG tablet Take 25 mg by  mouth 3 (three) times daily as needed for itching.    Historical Provider, MD  levothyroxine (SYNTHROID, LEVOTHROID) 50 MCG tablet Take 50 mcg by mouth daily before breakfast.     Historical Provider, MD  losartan (COZAAR) 50 MG tablet Take 50 mg by mouth daily. 10/18/15   Historical Provider, MD  meloxicam (MOBIC) 15 MG tablet Take 1 tablet by mouth every morning. 02/05/15   Historical Provider, MD  methotrexate (RHEUMATREX) 2.5 MG tablet Take 15 mg by mouth once a week. Caution:Chemotherapy. Protect from light. Takes on [place] Provider, MD  metoprolol tartrate (LOPRESSOR) 25 MG tablet Take 1 tablet (25 mg total) by mouth 2 (two) times daily. Patient not taking: Reported on 12/19/2015 03/29/14   [name], MD  mirabegron ER (MYRBETRIQ) 50 MG TB24 tablet Take 50 mg by mouth daily. 11/07/15   Historical Provider, MD  montelukast (SINGULAIR) 10 MG tablet Take 10 mg by mouth at bedtime.    Historical Provider, MD  omeprazole (PRILOSEC) 20 MG capsule Take 20 mg by mouth daily.    Historical Provider, MD  ondansetron (ZOFRAN ODT) 4 MG disintegrating tablet 4mg  ODT q4 hours prn nausea/vomit 11/18/15   Hannah Muthersbaugh, PA-C  phentermine (ADIPEX-P) 37.5 MG tablet Take 1 tablet by mouth daily with breakfast. 10/29/15   Historical Provider, MD  polyvinyl alcohol (LIQUIFILM TEARS) 1.4 % ophthalmic solution Place 1 drop into both eyes 3 (three) times daily as needed for dry eyes.    Historical Provider, MD  potassium chloride SA (K-DUR,KLOR-CON) 20 MEQ tablet Take 20 mEq by mouth 2 (two) times daily.    Historical Provider, MD  pravastatin (PRAVACHOL) 20 MG tablet Take 20 mg by mouth daily.    Historical Provider, MD  promethazine (PHENERGAN) 12.5 MG tablet Take 12.5 mg by mouth every 4 (four) hours as needed for nausea.    Historical Provider, MD  sulfamethoxazole-trimethoprim (BACTRIM DS,SEPTRA DS) 800-160 MG tablet Take 1 tablet by mouth 2 (two) times daily. Started 01/05 for 10 days     Historical Provider, MD  zolpidem (AMBIEN) 5 MG tablet Take 1 tablet (5 mg total) by mouth at bedtime as needed for sleep. Patient not taking: Reported on 12/19/2015 03/29/14   [name], MD   BP 151/83 mmHg  Pulse 90  Temp(Src) 97.5 F (36.4 C) (Oral)  Resp 16  Ht 4\' 9"  (1.448 m)  Wt 110 lb (49.896 kg)  BMI 23.80 kg/m2  SpO2 96% Physical Exam  Constitutional: She is oriented to person, place, and time. She appears well-developed.  HENT:  Head:  Normocephalic and atraumatic.  Eyes: EOM are normal.  Neck: Normal range of motion. Neck supple.  Cardiovascular: Normal rate.   Pulmonary/Chest: Effort normal.  Abdominal: Bowel sounds are normal.  Musculoskeletal:  Head to toe evaluation shows no hematoma, bleeding of the scalp, no facial abrasions, step offs, crepitus, no tenderness to palpation of the bilateral upper and lower extremities, no gross deformities, no chest tenderness, no pelvic pain.   Neurological: She is alert and oriented to person, place, and time. No cranial nerve deficit. Coordination normal.  Skin: Skin is warm and dry. No rash noted.  Nursing note and vitals reviewed.   ED Course  Procedures (including critical care time) Labs Review Labs Reviewed - No data to display  Imaging Review Dg Pelvis 1-2 Views  12/21/2015  CLINICAL DATA:  Unwitnessed fall on ice this morning landing on pelvis. EXAM: PELVIS - 1-2 VIEW COMPARISON:  None. FINDINGS: The cortical margins of the bony pelvis are intact. No fracture. Pubic symphysis and sacroiliac joints are congruent. Both femoral heads are well-seated in the respective acetabula. Chondrocalcinosis noted at the pubic symphysis. IMPRESSION: No pelvic fracture. Electronically Signed   By: Jeb Levering M.D.   On: 12/21/2015 02:46   Ct Head Wo Contrast  12/21/2015  CLINICAL DATA:  75 year old female with fall EXAM: CT HEAD WITHOUT CONTRAST CT CERVICAL SPINE WITHOUT CONTRAST TECHNIQUE: Multidetector CT imaging of the head and  cervical spine was performed following the standard protocol without intravenous contrast. Multiplanar CT image reconstructions of the cervical spine were also generated. COMPARISON:  Head CT dated 12/15/2014 FINDINGS: CT HEAD FINDINGS The ventricles are dilated and the sulci are prominent compatible with age-related atrophy. Periventricular and deep white matter hypodensities represent chronic microvascular ischemic changes. There is no intracranial hemorrhage. No mass effect or midline shift identified. There is mucoperiosteal thickening of paranasal sinuses with partial opacification of the left sphenoid and left maxillary sinus. The mastoid air cells are well aerated. The calvarium is intact. CT CERVICAL SPINE FINDINGS No definite acute fracture. There is extensive multilevel degenerative changes of the spine with ankylosis of the C5 and C6. There is grade 1 L4-5 anterolisthesis. There is reversal of the normal cervical lordosis. The odontoid and spinous processes are intact.There is normal anatomic alignment of the C1-C2 lateral masses. The visualized soft tissues appear unremarkable. IMPRESSION: No acute intracranial hemorrhage. Age-related atrophy and chronic microvascular ischemic disease. No acute/traumatic cervical spine pathology. Extensive degenerative changes. Electronically Signed   By: Anner Crete M.D.   On: 12/21/2015 03:04   Ct Head Wo Contrast  12/19/2015  CLINICAL DATA:  Multiple falls.  Head injury. EXAM: CT HEAD WITHOUT CONTRAST TECHNIQUE: Contiguous axial images were obtained from the base of the skull through the vertex without intravenous contrast. COMPARISON:  04/19/2015 FINDINGS: Moderate atrophy with moderate chronic microvascular ischemic change, similar to the prior study. Negative for acute infarct. Negative for intracranial hemorrhage. Negative for mass or edema. No shift of the midline structures. Negative for skull fracture. Mucosal edema in the paranasal sinuses IMPRESSION:  Atrophy and chronic microvascular ischemia.  No acute infarct. Electronically Signed   By: Franchot Gallo M.D.   On: 12/19/2015 10:05   Ct Cervical Spine Wo Contrast  12/21/2015  CLINICAL DATA:  75 year old female with fall EXAM: CT HEAD WITHOUT CONTRAST CT CERVICAL SPINE WITHOUT CONTRAST TECHNIQUE: Multidetector CT imaging of the head and cervical spine was performed following the standard protocol without intravenous contrast. Multiplanar CT image reconstructions of the cervical spine were also  generated. COMPARISON:  Head CT dated 12/15/2014 FINDINGS: CT HEAD FINDINGS The ventricles are dilated and the sulci are prominent compatible with age-related atrophy. Periventricular and deep white matter hypodensities represent chronic microvascular ischemic changes. There is no intracranial hemorrhage. No mass effect or midline shift identified. There is mucoperiosteal thickening of paranasal sinuses with partial opacification of the left sphenoid and left maxillary sinus. The mastoid air cells are well aerated. The calvarium is intact. CT CERVICAL SPINE FINDINGS No definite acute fracture. There is extensive multilevel degenerative changes of the spine with ankylosis of the C5 and C6. There is grade 1 L4-5 anterolisthesis. There is reversal of the normal cervical lordosis. The odontoid and spinous processes are intact.There is normal anatomic alignment of the C1-C2 lateral masses. The visualized soft tissues appear unremarkable. IMPRESSION: No acute intracranial hemorrhage. Age-related atrophy and chronic microvascular ischemic disease. No acute/traumatic cervical spine pathology. Extensive degenerative changes. Electronically Signed   By: Anner Crete M.D.   On: 12/21/2015 03:04   Mr Brain Wo Contrast  12/19/2015  CLINICAL DATA:  Altered mental status with confusion. EXAM: MRI HEAD WITHOUT CONTRAST TECHNIQUE: Multiplanar, multiecho pulse sequences of the brain and surrounding structures were obtained without  intravenous contrast. COMPARISON:  CT head 12/19/2015. MRI brain and cervical spine 03/26/2014. FINDINGS: The patient was unable to remain motionless for the exam. Small or subtle lesions could be overlooked. No evidence for acute stroke, acute hemorrhage, mass lesion, or extra-axial fluid. Advanced atrophy with hydrocephalus ex vacuo. Extensive focal and confluent T2 and FLAIR hyperintensities throughout the white matter, likely chronic microvascular ischemic change. Motion degraded sagittal images demonstrate mild pannus and slight basilar invagination. This is incompletely evaluated, but the observed axial images do not show cervicomedullary compression. There is advanced multilevel disc space narrowing redemonstrated. Flow voids are maintained.  There are no foci of chronic hemorrhage. There is extensive paranasal sinus disease with mucosal thickening in the LEFT division sphenoid and LEFT maxillary sinus. BILATERAL cataract extraction. No air-fluid level. No mastoid fluid. Compared with priors, a similar appearance is noted. IMPRESSION: Motion degraded examination demonstrating no acute intracranial findings. Advanced atrophy with extensive white matter disease. Chronic sinusitis.  Cervical spondylosis. Electronically Signed   By: Staci Righter M.D.   On: 12/19/2015 18:57   Dg Chest Port 1 View  12/19/2015  CLINICAL DATA:  75 year old female with increased weakness. Multiple recent falls at home. Initial encounter. EXAM: PORTABLE CHEST 1 VIEW COMPARISON:  11/18/2015 and earlier. FINDINGS: Portable AP semi upright view at 1006 hours. Left chest cardiac event recorder re- demonstrated. Hiatal hernia suspected. Stable cardiac size and mediastinal contours. Allowing for portable technique, the lungs are clear. No pneumothorax. Bilateral shoulder arthroplasties re - demonstrated. No acute osseous abnormality identified. IMPRESSION: No acute cardiopulmonary abnormality. Electronically Signed   By: Genevie Ann M.D.    On: 12/19/2015 10:15   I have personally reviewed and evaluated these images and lab results as part of my medical decision-making.   EKG Interpretation None      MDM   Final diagnoses:  Fall, initial encounter  Contusion   Pt comes in with cc of fall. She had an obs stay just yday. Pt likely had medication related encephalopathy, and when the meds wore off she was functional and stable for d/c. She never had an inpatient diagnosis in first place. Pt had SW, Case management that saw her. She is eligible for outpatient eval and admission to SNF. Home health has been ordered.  - Imaging is  neg - consulted Case and SW. - Consulted Telepsych for decision making capacity - POA made aware that pt will not be admitted. She has already walked her and didn't fall. She ambulated w/o assistance. Doesn't mean that pt is completely stable either - but just means that there is no role for ER to force admission and that the outpatient workup is the way to go.      Varney Biles, MD 12/21/15 [phone]

## 2015-12-21 NOTE — ED Notes (Signed)
Pt up and ambulatory to nurses station without assistance.  KED still in place.

## 2015-12-21 NOTE — ED Notes (Signed)
Informed Nanavati, MD about family's concerns. Kathrynn Humble, MD states he will call the family.

## 2015-12-21 NOTE — Discharge Instructions (Signed)
We saw you in the ER after you had a fall. All the imaging results are normal, no fractures seen. No evidence of brain bleed. Please be very careful with walking, and do everything possible to prevent falls.  Contusion A contusion is a deep bruise. Contusions are the result of a blunt injury to tissues and muscle fibers under the skin. The injury causes bleeding under the skin. The skin overlying the contusion may turn blue, purple, or yellow. Minor injuries will give you a painless contusion, but more severe contusions may stay painful and swollen for a few weeks.  CAUSES  This condition is usually caused by a blow, trauma, or direct force to an area of the body. SYMPTOMS  Symptoms of this condition include:  Swelling of the injured area.  Pain and tenderness in the injured area.  Discoloration. The area may have redness and then turn blue, purple, or yellow. DIAGNOSIS  This condition is diagnosed based on a physical exam and medical history. An X-ray, CT scan, or MRI may be needed to determine if there are any associated injuries, such as broken bones (fractures). TREATMENT  Specific treatment for this condition depends on what area of the body was injured. In general, the best treatment for a contusion is resting, icing, applying pressure to (compression), and elevating the injured area. This is often called the RICE strategy. Over-the-counter anti-inflammatory medicines may also be recommended for pain control.  HOME CARE INSTRUCTIONS   Rest the injured area.  If directed, apply ice to the injured area:  Put ice in a plastic bag.  Place a towel between your skin and the bag.  Leave the ice on for 20 minutes, 2-3 times per day.  If directed, apply light compression to the injured area using an elastic bandage. Make sure the bandage is not wrapped too tightly. Remove and reapply the bandage as directed by your health care provider.  If possible, raise (elevate) the injured area  above the level of your heart while you are sitting or lying down.  Take over-the-counter and prescription medicines only as told by your health care provider. SEEK MEDICAL CARE IF:  Your symptoms do not improve after several days of treatment.  Your symptoms get worse.  You have difficulty moving the injured area. SEEK IMMEDIATE MEDICAL CARE IF:   You have severe pain.  You have numbness in a hand or foot.  Your hand or foot turns pale or cold.   This information is not intended to replace advice given to you by your health care provider. Make sure you discuss any questions you have with your health care provider.   Document Released: 09/05/2005 Document Revised: 08/17/2015 Document Reviewed: 04/13/2015 Elsevier Interactive Patient Education [employer].

## 2015-12-21 NOTE — BH Assessment (Addendum)
Tele Assessment Note   Rhonda Davenport is an 75 y.o. female  that presents this date being admitted due to concerns for dementia and AMS. Patient reported a previous fall while she was at church but could not recall the date. It was very unclear at the time of this assessment what services she has received in the past although this writer reviewed previous notes that indicate her brother Rennis Petty [phone] is her POA. Patient reports she lives with her husband but previous noted indicate that she resides alone. Patient has had multiple falls in the past week with EMS reporting that pt fell outside her home due to ice and neighbor called EMS. Pt reports she didn't fall due to ice, but unable to explain the cause of fall either. This Probation officer has reviewed previous notes that have indicated patient has multiple admissions and presented earlier being a poor historian. This Probation officer spoke with Merry Proud MSW at [facility] who also spoke with patient this date and agreed patient seemed to be very disoriented. Patient was not time/palce orientated and was unaware of any collateral contact numbers or could remember her home address. This Probation officer staffed case with [place] DNP and was advised to contact [facility] MD Wilson Singer) and request further assessment/evaluation to be completed by psychiatry. Kohut stated he would contact psychiatry to advise.     Diagnosis: 780.97 Altered Mental Status Past Medical History:  Past Medical History  Diagnosis Date  . Hypertension   . High cholesterol   . 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 GERD (gastroesophageal reflux disease)   . Anemia   . Thyroid disease     Hypothyroidism  . H/O blood clots     pt states had blood clot in her rt arm  . Fall   . 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 Chronic low back pain     Past Surgical History  Procedure Laterality Date  . Joint replacement      TKR, left  . Abdominal hysterectomy    . Appendectomy    . Shoulder surgery      Bilateral  . Carpal  tunnel release Bilateral   . Tonsillectomy      Family History:  Family History  Problem Relation Age of Onset  . Rheum arthritis Mother   . Rheum arthritis Father   . Healthy Brother     Social History:  reports that she quit smoking about 33 years ago. Her smoking use included Cigarettes. She has a 26 pack-year smoking history. She has never used smokeless tobacco. She reports that she does not drink alcohol or use illicit drugs.  Additional Social History:  Alcohol / Drug Use Pain Medications: See MAR Prescriptions: See MAR Over the Counter: See MAR History of alcohol / drug use?: No history of alcohol / drug abuse Longest period of sobriety (when/how long): na  CIWA: CIWA-Ar BP: 151/83 mmHg Pulse Rate: 90 COWS:    PATIENT STRENGTHS: (choose at least two) Motivation for treatment/growth Supportive family/friends  Allergies:  Allergies  Allergen Reactions  . Ibuprofen Itching  . Benztropine Rash  . Codeine Palpitations    Home Medications:  (Not in a hospital admission)  OB/GYN Status:  No LMP recorded. Patient is postmenopausal.  General Assessment Data [facility]: [facility] TTS Assessment: In system Is this a Tele or Face-to-Face Assessment?: Tele Assessment Is this an Initial Assessment or a Re-assessment for this encounter?: Initial Assessment Marital status: Married Gould name: na Is patient pregnant?: No Pregnancy Status:  No Living Arrangements: Spouse/significant other Can pt return to current living arrangement?: Yes Admission Status: Voluntary Is patient capable of signing voluntary admission?: Yes Referral Source: Self/Family/Friend Insurance type: [place] Screening Exam ([place]) Medical Exam completed: Yes  Crisis Care Plan Living Arrangements: Spouse/significant other Legal Guardian: Other: (na) Name of Psychiatrist: Unknown/None Name of Therapist: None  Education Status Is patient currently in  school?: No Current Grade: Na Highest grade of school patient has completed: 12 Name of school: NA Contact person: Micheline Maze  Risk to self with the past 6 months Suicidal Ideation: No Has patient been a risk to self within the past 6 months prior to admission? : No Suicidal Intent: No Has patient had any suicidal intent within the past 6 months prior to admission? : No Is patient at risk for suicide?: No Suicidal Plan?: No Has patient had any suicidal plan within the past 6 months prior to admission? : No Access to Means: No What has been your use of drugs/alcohol within the last 12 months?: Denies Previous Attempts/Gestures: No How many times?: 0 Other Self Harm Risks: None Triggers for Past Attempts: Unknown Intentional Self Injurious Behavior: None Family Suicide History: No Recent stressful life event(s): Other (Comment) (Health issues) Persecutory voices/beliefs?: No Depression: No Depression Symptoms: Despondent Substance abuse history and/or treatment for substance abuse?: No Suicide prevention information given to non-admitted patients: Not applicable  Risk to Others within the past 6 months Homicidal Ideation: No Does patient have any lifetime risk of violence toward others beyond the six months prior to admission? : No Thoughts of Harm to Others: No Current Homicidal Intent: No Current Homicidal Plan: No Access to Homicidal Means: No Identified Victim: NA History of harm to others?: No Assessment of Violence: None Noted Violent Behavior Description: NA Does patient have access to weapons?: No Criminal Charges Pending?: No Does patient have a court date: No Is patient on probation?: No  Psychosis Hallucinations: None noted Delusions: None noted  Mental Status Report Appearance/Hygiene: Unremarkable Eye Contact: Fair Motor Activity: Unremarkable Speech: Slow Level of Consciousness: Unable to assess Mood: Pleasant Affect: Apprehensive, Inconsistent  with thought content Anxiety Level: Minimal Thought Processes: Unable to Assess Judgement: Impaired Orientation: Not oriented Obsessive Compulsive Thoughts/Behaviors: None  Cognitive Functioning Concentration: Decreased Memory: Recent Impaired, Remote Impaired IQ: Average Insight: Poor Impulse Control: Unable to Assess Appetite: Fair Weight Loss: 0 Weight Gain: 0 Sleep: No Change Total Hours of Sleep: 8 Vegetative Symptoms: Unable to Assess  ADLScreening [facility] Assessment Services) Patient's cognitive ability adequate to safely complete daily activities?: No Patient able to express need for assistance with ADLs?: Yes Independently performs ADLs?: No  Prior Inpatient Therapy Prior Inpatient Therapy: No Prior Therapy Dates: na Prior Therapy Facilty/Provider(s): na Reason for Treatment: na  Prior Outpatient Therapy Prior Outpatient Therapy: No Prior Therapy Dates: na Prior Therapy Facilty/Provider(s): unknown Reason for Treatment: NA Does patient have an ACCT team?: No Does patient have Intensive In-House Services?  : No Does patient have [facility] services? : No Does patient have P4CC services?: No  ADL Screening (condition at time of admission) Patient's cognitive ability adequate to safely complete daily activities?: No Patient able to express need for assistance with ADLs?: Yes Independently performs ADLs?: No       Abuse/Neglect Assessment (Assessment to be complete while patient is alone) Physical Abuse: Denies Verbal Abuse: Denies Sexual Abuse: Denies Exploitation of patient/patient's resources: Denies Self-Neglect: Denies Values / Beliefs Cultural Requests During Hospitalization: None Spiritual Requests  During Hospitalization: None Consults Spiritual Care Consult Needed: No Social Work Consult Needed: No Regulatory affairs officer (For Healthcare) Does patient have an advance directive?: No Would patient like information on creating an advanced directive?: No -  patient declined information    Additional Information 1:1 In Past 12 Months?: No CIRT Risk: No Elopement Risk: No Does patient have medical clearance?: Yes     Disposition: This Probation officer will contacted ED MD Kohut to advise that patient would require further assessment/evaluation to be completed by psychiatry. Kohut stated he would contact psychiatry to advise.      Disposition Initial Assessment Completed for this Encounter: Yes Disposition of Patient: Other dispositions (To be seen by Psychiatry) Other disposition(s):  (To be seen by Psychiatry)  Mamie Nick 12/21/2015 11:26 AM

## 2015-12-21 NOTE — ED Provider Notes (Signed)
Patient repeatedly trying to walk out of the emergency room. Multiple staff members around her and unable to redirect. She was placed in a wheelchair but continues to resist and insist on leaving. Do not feel that this is safe or appropriate at this time. She has not demonstrated to me she has decision making capability. I'm not sure how much this is her being acutely agitated with me and not wanting to cooperate versus baseline cognitive impairment, psychiatric illness. From reviewing prior notes, it appear she lives alone. Has been seen several times recently for same. I do not feel it is in her best interest to leave at this time unless family member is willing to pick her up and assume care. Would like formal psychiatric evaluation   12:28 PM Cousin at beside. Concerned about pt's safety. She does live by herself. Will walk outside and door will lock behind her and then she wanders around. Pt's brother is financial POA as far as she is aware. He lives in [place], [place]. Apparently his wife (sister-in-law) is driving to [place] today. Also reports that past was admitted at [facility] last week. She is not sure if he has psychiatric diagnoses but cousin remembers seeing psychiatry follow-up listed on her papers. Will obtain records. Cousin lives locally but does not have means to take care of her.   Virgel Manifold, MD 12/21/15 1233

## 2015-12-21 NOTE — ED Notes (Signed)
Pt being IVC

## 2015-12-21 NOTE — ED Notes (Addendum)
Per EMS, patient was walking down driveway and slipped on ice. Patient states she had laid there for 15 minutes; neighbor saw her laying on ground. Patient put in [facility] by EMS, because unknown if hit head, neck, or back. Patient states she remembers falling; denies hitting head; denies any neck, back, shoulder, or head pain. Patient has had increased confusion (possible dementia); lives home alone.

## 2015-12-21 NOTE — ED Notes (Signed)
Pt cousin Sherrie Sport came to ED. Cousin explained that pts husband is dead and she has no children. Next of kin is brother. Cousin called brother and explained that brother needs to come to ED to obtain medical POA and determine pts care after ED.   rn explained this as well to brother. Brother stated that his wife is working on getting a caregiver at pts house. rn asked if this was set up and brother was not sure. Brother understands that he needs to come to ED. Michela Pitcher it would take him a few hours because he lives at [facility].

## 2015-12-21 NOTE — ED Notes (Signed)
Pt 's family brought her food and she ate dinner.

## 2015-12-21 NOTE — ED Notes (Signed)
Pt now has attempted to run out the ambulance bay and leave the ED without shoes or clothes on 2x. Pt will not stay in bed. Pt keeps talking about needing to talk to her husband. rn helped pt call home phone, pt stated that no one answered.

## 2015-12-21 NOTE — ED Notes (Signed)
MD at bedside. 

## 2015-12-21 NOTE — ED Notes (Addendum)
Pt telling nurse lassiter and this nurse to throw her clothes away. Brother is at bedside. Clothing given to brother. Brother is POA, unsure if financial and medical POA or just financial. Brother will work on getting paperwork from [employer]. [facility] psych provider spoke with pt. Brother given fax number to fax POA paperwork tomorrow.

## 2015-12-21 NOTE — ED Notes (Signed)
CASE MANAGER KIM PRESENT SPEAKING WITH PT 

## 2015-12-21 NOTE — ED Notes (Signed)
Pt out of restraints

## 2015-12-21 NOTE — ED Notes (Addendum)
Patient's niece called to hospital. Patient is having increased confusion (possible dementia, but has not been diagnosed). Patient locked herself out of her house and does not have a key to get back inside. Patient cannot go home tonight due to no way to get inside. Family may have key tomorrow.  Family wants a psych consult and possible placement into a nursing facility. Patient has previous adamantly refused nursing facility; family wondering if she should be allowed to make this decision. Niece's # [phone] Brother's (POA) #  [phone]  Lamount Cranker RN, notified. Case management consult ordered.

## 2015-12-21 NOTE — Progress Notes (Signed)
Spoke with Rhonda Davenport states pt was called on 12/20/15 by well care nurse and refused services Stated she was not interested in home health  CM to reassess this need and [facility] staff to visit pt in ED

## 2015-12-21 NOTE — Progress Notes (Signed)
CSW staffed with nurse. Nurse provided a contact number for a brother (Fowler) [phone] and a contact number for a niece [phone]. Nurse provided information to CSW per note that patient was from home, had been locking herself out of the home, and patient has had frequent falls.   Nurse assisted patient to sit up in bed to speak with CSW. Patient began speaking with CSW after waking up and becoming a little more alert. Patient began eating breakfast while speaking with CSW. Patient stated she had to "hurry and get to church". CSW asked what church and patient stated "I'm at the church". Patient stated she reports to the [facility] for a fall that occurred at a church, where she slipped on some ice. CSW asked patient how many falls had taken place in the past six months. Patient stated "alot" and raised her right arm for CSW to see bruises. CSW asked patient were the bruises from falls and patient replied "yes".   CSW asked patient how she was transported to the ED and patient's response was "ambulance brought her to the hospital". CSW asked who called the ambulance and patient's response was "guess the church called".    Patient stated she resides at home with her husband, Kandy Steuber. CSW asked patient if her husband was here and patient stated she did not know because she had not seen him. Patient could not remember a contact number for the husband. Patient stated she has two neices:  [place] and Ally. Patient stated she has a brother named Rennis Petty. Patient stated she has a lot of familial support.  Patient stated she utilizes a walker. Patient stated she can bath and dress herself daily. Patient stated she continues to cook at home as well. No questions were noted for CSW at this time.   Genice Rouge [id number] ED CSW 12/21/2015 11:48 AM

## 2015-12-21 NOTE — ED Notes (Signed)
Pt asking to leave, saying she wants to go home to see her husband that "she has not seen her husband in a week'. rn stated "they told me your husband is dead". Pt stated "my first husband is dead, by second husband is alive". When I asked what his name was pt stated "Bruce".

## 2015-12-21 NOTE — ED Notes (Signed)
Pt in tele-psych assessment

## 2015-12-21 NOTE — Progress Notes (Addendum)
Patient has been referred to geriatric inpatient psych treatment at: [facility] - per [facility], beds for all open. [facility] - per intake, fax referral. [facility] - per Noel Journey, fax it for review. [facility] - per Clarice Pole, geriatric beds open. [facility] - per [employer] [facility], geriatric beds, fax it. [facility] - per Daleen Snook, fax referral. [facility] - left voicemail. [facility] - per Dub Mikes, fax referral for review.  Declined at: [facility] - per [place], due dementia/disorientation  Verlon Setting, [place] Disposition staff 12/21/2015 9:46 PM

## 2015-12-21 NOTE — ED Notes (Addendum)
Rennis Petty - brother [phone]  Barnie Alderman- sister in law [phone]  Sherrie Sport- cousin [phone]  Ridgeland Cellar- niece- works in [place] [phone]

## 2015-12-22 DIAGNOSIS — T148 Other injury of unspecified body region: Secondary | ICD-10-CM | POA: Diagnosis not present

## 2015-12-22 DIAGNOSIS — F39 Unspecified mood [affective] disorder: Secondary | ICD-10-CM | POA: Diagnosis not present

## 2015-12-22 MED ORDER — ASPIRIN 81 MG PO CHEW
81.0000 mg | CHEWABLE_TABLET | Freq: Once | ORAL | Status: AC
  Administered 2015-12-22: 81 mg via ORAL
  Filled 2015-12-22: qty 1

## 2015-12-22 NOTE — Progress Notes (Signed)
CSW was notified by nurse that family member/ Lelan Pons left message with her and would like to speak with CSW.  CSW reached out to Marie/Family member at [phone]. Lelan Pons states she would like to know what is going on with patient. CSW explaied to family member that there is no release for patient and that health care information cannot be given without release.   Family member asked if patient would be able to sign a release. CSW informed family member that in general staff members will not ask any patient to sign any documentation if patient may be confused or not have capacity.  CSW made nurse aware. CSW made NP aware.  Willette Brace [id number] ED CSW 12/22/2015 6:10 PM

## 2015-12-22 NOTE — ED Notes (Signed)
Bed: WA33 Expected date:  Expected time:  Means of arrival:  Comments: Hall B 

## 2015-12-22 NOTE — BH Assessment (Signed)
Faxed a copy of pt's labs to [facility].

## 2015-12-22 NOTE — BH Assessment (Signed)
Writer informed by LCSW-Brittany that family member was present and asking to speak with TTS. Writer did go to speak with family. The family member "Lelan Pons" was inquiring about patient's disposition. Writer was unable to provide details as patient has not signed a consent form. Patient does not appear to be in a position to make decisions or sign a legal document based on clinical information noted by psychiatry, presentation, etc. Writer did however provide the family member with general information making  aware of the "general process". Writer explained that patients that present to [facility] and meet inpatient criteria are referred to the appropriate facilities recommended by the examining psychiatrist. The family member made this writer aware that she already knows patient is being referred to [facility]. She further wanted to know why patient was being referred to such facility, names of  facilities, reasons for declines, and what type of information TTS staff was referring out. She was concerned that TTS was not sending out appropriate information stating, "I think you guys are sending information out that is painting her to be a animal".   Patient's nurse-Janet was present. She assured family member that management is aware of the families concerns and would make contact tomorrow.

## 2015-12-22 NOTE — Consult Note (Signed)
Psychiatric Specialty Exam: Physical Exam  ROS  Blood pressure 163/86, pulse 112, temperature 98.4 F (36.9 C), temperature source Oral, resp. rate 18, height 4\' 9"  (1.448 m), weight 49.896 kg (110 lb), SpO2 99 %.Body mass index is 23.8 kg/(m^2).  General Appearance: Casual  Eye Contact::  Good  Speech:  Clear and Coherent and Normal Rate  Volume:  Normal  Mood:  Anxious and Depressed  Affect:  Congruent and Depressed  Thought Process:  Coherent  Orientation:  Full (Time, Place, and Person)  Thought Content:  WDL  Suicidal Thoughts:  No  Homicidal Thoughts:  No  Memory:  Immediate;   Fair Recent;   Fair Remote;   Fair  Judgement:  Fair  Insight:  Shallow  Psychomotor Activity:  Psychomotor Retardation  Concentration:  Good  Recall:  Poor  Fund of Knowledge:  Fair  Language:  Good  Akathisia:  No  Handed:  Right  AIMS (if indicated):     Assets:  Desire for Improvement  ADL's:  Intact  Cognition:  Impaired,  Mild  Sleep:      Patient was seen in her room resting.  She is alert and oriented x 2. Patient thought [facility] then corrected herself.  She thought the President is Mr Milinda Pointer but was corrected by this Probation officer that it is Freight forwarder.  .  Patient remains ambivalent in reference to her inability to take care of herself and her poor mobility at home.  She is hesitant to move to an assistant living facility.due to her falls.  She is tolerating po fluid and eating.  She denies SI/HI/AVH. Unspecified mood (affective) disorder ([place])   Plan:  Seek placement at any facility with available Gero-psychiatric bed for admission.  Rhonda Davenport   PMHNP-BC Patient seen face-to-face for psychiatric evaluation, chart reviewed and case discussed with the physician extender and developed treatment plan. Reviewed the information documented and agree with the treatment plan. Corena Pilgrim, MD

## 2015-12-22 NOTE — Progress Notes (Signed)
CSW reached out to the following hospitals, who have stated that the patient has been denied:  [facility] - Shirlee Limerick states patient has been declined due to not meeting criteria and having multiple falls.  [facility] - Staff states patient has been denied due to medical acuity.    CSW reached out to [facility] and spoke with Pamala Hurry to inquire if patient has been accepted. Pamala Hurry informed CSW to fax her clinicals. CSW will fax patient clinicals to facility. CSW will continue to follow up with facility.  Willette Brace [id number] ED CSW 12/22/2015 6:05 PM

## 2015-12-22 NOTE — ED Notes (Addendum)
Pt has been trying to leave to see her parents who are deceased. Pt sister in law phoned stating the pt can no longer live alone . She said. "She has gotten locked out of her house three times this week and has been wandering around the neighborhood to have a neighbor let her in. Her brother wants her in an assisted living  facility[place]Pt is pleasant but very confused at times. She stated, the president is president Eve[place]. Pt does not know where she is but does know her name and DOB. Pt stated the year is 2007.  Pt does have a sitter for a flight risk. 14:15a- Sister in law stated the pt has been mixing medications and takes a lot of buspar.Pts brother is here stating he can not find the medical power of attorney. He stated,"I am waiting for my lawyer to show up to get power of attorney."Pt told the writer she did not want her brother to be power of attorney because, "then he can boss me around."  11:50a- Pts brother and lawyer showed up requesting two  notaries. Director made aware pt is not alert and oriented times three. Marland Kitchen Higinio Roger is speaking with the family.(11:55a)-4;40pm Pts sister in law Lelan Pons phoned requesting to speak to a Education officer, museum. Phoned [place] to make her aware. The sister in law , Lelan Pons, left her contact numbers as: [phone] and [phone]. Pt stated she and her brother do not get along. (4:45pm) 6pm Sister in law showed up with power of attorney papers that she states she found in the desk in the bottom drawer at the pts house. Contacted TTS. Family  presents with power of attorney papers dated July 2007. Higinio Roger notified. (6:10pm) Copies of the papers made and placed in pts black binder labeled 33. [place] has the copies of the papers and continues to look for placement for the pt.

## 2015-12-23 DIAGNOSIS — T148 Other injury of unspecified body region: Secondary | ICD-10-CM | POA: Diagnosis not present

## 2015-12-23 MED ORDER — WHITE PETROLATUM GEL
Status: DC | PRN
  Filled 2015-12-23: qty 5

## 2015-12-23 NOTE — ED Notes (Signed)
Barnie Alderman  [phone] Sister in law

## 2015-12-23 NOTE — ED Notes (Signed)
Family at bedside. 

## 2015-12-23 NOTE — Progress Notes (Signed)
[facility] authorization number is [id number] effective starting today 1/13 through 1/19. [place] referral form completed and faxed to [facility]. Demographics to be given to [facility].  Referral was followed up at: [facility] - no answer. [facility] - per Beckie Busing, "We'll look at referral when we get a chance". [facility] - per [facility], "Referral not in the stack, refax it."  Declined at: [facility] per [place], due dementia/disorientation [facility] per Daleen Snook, "due our unit acuity". [facility] - per Shirlee Limerick, due not meeting criteria and multiple falls. [facility] - due medical acuity.  At capacity: [facility], [place] Disposition staff 12/23/2015 10:14 PM

## 2015-12-23 NOTE — BH Assessment (Signed)
[facility] Assessment Progress Note  The following facilities have been contacted to seek placement for this pt, with results as noted:  Beds available, information sent, decision pending:  [facility]  At capacity:  [facility]   Jalene Mullet, [place] Triage Specialist [phone]

## 2015-12-23 NOTE — ED Notes (Signed)
Lelan Pons sister in law is requesting that pt be taken off of buspar, due to causing falls

## 2015-12-23 NOTE — BH Assessment (Signed)
Patient was reassessed by TTS.   Patient denies SI/HI and AVH. Patient states that she doe snot remember why she is in the hospital. When asked if she remembers falling patient states that she has fell in the past but plans to use her walker and wheelchair once she is discharged. Patient states that she feels that she is safe for discharge and was told that she will have nurses come into the home to monitor  Her and help her once she is discharged.   Rosalin Hawking, LCSW Therapeutic Triage Specialist [facility] 12/23/2015 1:11 PM

## 2015-12-24 ENCOUNTER — Emergency Department (HOSPITAL_COMMUNITY): Payer: Medicare Other

## 2015-12-24 DIAGNOSIS — Z45018 Encounter for adjustment and management of other part of cardiac pacemaker: Secondary | ICD-10-CM | POA: Diagnosis not present

## 2015-12-24 DIAGNOSIS — Z91148 Patient's other noncompliance with medication regimen for other reason: Secondary | ICD-10-CM

## 2015-12-24 DIAGNOSIS — T148 Other injury of unspecified body region: Secondary | ICD-10-CM | POA: Diagnosis not present

## 2015-12-24 DIAGNOSIS — T148XXA Other injury of unspecified body region, initial encounter: Secondary | ICD-10-CM | POA: Insufficient documentation

## 2015-12-24 DIAGNOSIS — Z9114 Patient's other noncompliance with medication regimen: Secondary | ICD-10-CM

## 2015-12-24 DIAGNOSIS — F19988 Other psychoactive substance use, unspecified with other psychoactive substance-induced disorder: Secondary | ICD-10-CM | POA: Diagnosis not present

## 2015-12-24 DIAGNOSIS — F1310 Sedative, hypnotic or anxiolytic abuse, uncomplicated: Secondary | ICD-10-CM | POA: Diagnosis not present

## 2015-12-24 DIAGNOSIS — T50901A Poisoning by unspecified drugs, medicaments and biological substances, accidental (unintentional), initial encounter: Secondary | ICD-10-CM | POA: Diagnosis present

## 2015-12-24 DIAGNOSIS — T50905A Adverse effect of unspecified drugs, medicaments and biological substances, initial encounter: Secondary | ICD-10-CM | POA: Diagnosis present

## 2015-12-24 DIAGNOSIS — R410 Disorientation, unspecified: Secondary | ICD-10-CM | POA: Diagnosis present

## 2015-12-24 NOTE — Clinical Social Work Note (Signed)
CSW spoke with pt's family who have stated that pt had been taken off of Busbar in past due to falls but that Somehow was put back on it.  Family wants pt to go into [facility]ero psychiatric treatment.  CSW provided psychiatric Providers with medication information and they will be taking pt off of her busbar.  CSW reviewed pt chart And MD facilitated IVC paperwork.    Dede Query, [place] Worker - Weekend Coverage

## 2015-12-24 NOTE — Evaluation (Signed)
Physical Therapy Evaluation Patient Details Name: Rhonda Davenport MRN: [id number] DOB: 06-05-1941 Today's Date: 12/24/2015   History of Present Illness  75 yo female admitted with AMS, falls. Hx of HTN, gait d/o, chronic LBP.   Clinical Impression  Pt admitted with above diagnosis. Pt currently with functional limitations due to the deficits listed below (see PT Problem List).  Pt will benefit from skilled PT to increase their independence and safety with mobility to allow discharge to the venue listed below.    Pt is currently refusing SNF although  she could benefit from STSNF; If ALF is an option would recommend HHPT in that setting; Pt has good distance tolerance but does demonstrate multiple dynamic balance deficits and is at risk for continued falls; pt feels she can go home and amb with her walker without difficulty, further demonstrating her diminished insight into her own deficits and safety issues; will continue to follow;     Follow Up Recommendations SNF;Supervision/Assistance - 24 hour (pt is refusing SNF, would recommend HHPT at ALF )    Equipment Recommendations    none   Recommendations for Other Services       Precautions / Restrictions Precautions Precautions: Fall      Mobility  Bed Mobility Overal bed mobility: Needs Assistance Bed Mobility: Supine to Sit     Supine to sit: Min guard     General bed mobility comments: Increased time and effort. close guard for safety.   Transfers Overall transfer level: Needs assistance Equipment used: None;Rolling walker (2 wheeled) (x2 from bed) Transfers: Sit to/from Stand Sit to Stand: Min assist         General transfer comment: pt has LOB posteriorly and requires 2 attempts to come to full stand on each standing trial  Ambulation/Gait Ambulation/Gait assistance: Min assist Ambulation Distance (Feet): 125 Feet (x2) Assistive device: Rolling walker (2 wheeled);None Gait Pattern/deviations: Wide base of  support;Trunk flexed;Step-through pattern;Decreased stride length;Festinating     General Gait Details: requires multi-modal cues for safety during gait; performed 125' with RW and 125' without  AD, needing min assist for both; pt requires constant cues for correct technique when using RW, she begins to shift her COG too far forward  and does not stay within frame of RW;   Stairs            Wheelchair Mobility    Modified Rankin (Stroke Patients Only)       Balance Overall balance assessment: History of Falls;Needs assistance Sitting-balance support: No upper extremity supported;Feet supported Sitting balance-Leahy Scale: Good Sitting balance - Comments: pt is able to shift wt and pick up object form floor with close guarding for safety     Standing balance-Leahy Scale: Fair               High level balance activites: Backward walking;Direction changes;Turns;Sudden stops;Head turns High Level Balance Comments: partial BERG done, see below;  pt continues to demonstrate balance deficits (LOB with head nods and anterior LOB with RW needing min assist to recover/not fall fwd, reactions are delayed and pt moves quickly to steppage strategy), at risk for continued falls; pt admits to having many falls at home but can't site the number  Standardized Balance Assessment Standardized Balance Assessment : Berg Balance Test Berg Balance Test Sit to Stand: Needs minimal aid to stand or to stabilize Standing Unsupported: Unable to stand 30 seconds unassisted Sitting with Back Unsupported but Feet Supported on Floor or Stool: Able to sit 2 minutes  under supervision Stand to Sit: Sits safely with minimal use of hands Transfers: Able to transfer with verbal cueing and /or supervision Standing Unsupported with Eyes Closed: Able to stand 3 seconds Standing Ubsupported with Feet Together: Needs help to attain position but able to stand for 30 seconds with feet together From Standing, Reach  Forward with Outstretched Arm: Can reach forward >12 cm safely (5") From Standing Position, Pick up Object from Floor: Able to pick up shoe, needs supervision Turn 360 Degrees: Needs close supervision or verbal cueing         Pertinent Vitals/Pain Pain Assessment: No/denies pain    Home Living Family/patient expects to be discharged to:: Private residence Living Arrangements: Spouse/significant other     Home Access: Level entry     Home Layout: One level Home Equipment: Walker - 2 wheels Additional Comments: pt has walker but doesn't use it; per sitter pt's brother reports she is very unsafe with  the RW    Prior Function Level of Independence: Independent               Hand Dominance        Extremity/Trunk Assessment   Upper Extremity Assessment: Generalized weakness           Lower Extremity Assessment: Generalized weakness      Cervical / Trunk Assessment: Kyphotic  Communication   Communication: No difficulties  Cognition Arousal/Alertness: Awake/alert Behavior During Therapy: WFL for tasks assessed/performed Overall Cognitive Status: Impaired/Different from baseline Area of Impairment: Safety/judgement;Problem solving         Safety/Judgement: Decreased awareness of safety;Decreased awareness of deficits   Problem Solving: Requires verbal cues;Requires tactile cues;Difficulty sequencing General Comments: pt states she "will not fall at home" and   "will use my walker"; she demonstrates diminished insight into her balance deficits    General Comments      Exercises        Assessment/Plan    PT Assessment Patient needs continued PT services  PT Diagnosis Difficulty walking   PT Problem List Decreased balance;Decreased mobility;Decreased knowledge of use of DME;Decreased safety awareness  PT Treatment Interventions Gait training;Functional mobility training;Therapeutic activities;Patient/family education;Balance training;Therapeutic  exercise;DME instruction   PT Goals (Current goals can be found in the Care Plan section) Acute Rehab PT Goals Patient Stated Goal: I am going home PT Goal Formulation: With patient Time For Goal Achievement: 12/31/15 Potential to Achieve Goals: Good    Frequency Min 3X/week   Barriers to discharge        Co-evaluation               End of Session Equipment Utilized During Treatment: Gait belt Activity Tolerance: Patient tolerated treatment well Patient left: with call bell/phone within reach;with nursing/sitter in room;Other (comment) (EOB )      Functional Assessment Tool Used: clinical judgement Functional Limitation: Mobility: Walking and moving around Mobility: Walking and Moving Around Current Status [phone]): At least 1 percent but less than 20 percent impaired, limited or restricted Mobility: Walking and Moving Around Goal Status [phone]): At least 1 percent but less than 20 percent impaired, limited or restricted    Time: 1547-1601 PT Time Calculation (min) (ACUTE ONLY): 14 min   Charges:   PT Evaluation $PT Eval Moderate Complexity: 1 Procedure     PT G Codes:   PT G-Codes **NOT FOR INPATIENT CLASS** Functional Assessment Tool Used: clinical judgement Functional Limitation: Mobility: Walking and moving around Mobility: Walking and Moving Around Current Status [id number]): At least  1 percent but less than 20 percent impaired, limited or restricted Mobility: Walking and Moving Around Goal Status [phone]): At least 1 percent but less than 20 percent impaired, limited or restricted    [facility] 12/24/2015, 4:35 PM

## 2015-12-24 NOTE — ED Notes (Signed)
Bed: [id number] Expected date:  Expected time:  Means of arrival:  Comments: Hold for Room 33

## 2015-12-24 NOTE — NC FL2 (Signed)
[place] MEDICAID FL2 LEVEL OF CARE SCREENING TOOL     IDENTIFICATION  Patient Name: Rhonda Davenport Birthdate: 02-Mar-1941 Sex: female Admission Date (Current Location): 12/21/2015  [facility] and [place] Number:  Herbalist and Address:  [facility],  [place] [address] [place]      Provider Number: [id number]  Attending Physician Name and Address:  Provider Default, MD  Relative Name and Phone Number:       Current Level of Care: Hospital Recommended Level of Care:   Prior Approval Number:    Date Approved/Denied:   PASRR Number:    Discharge Plan:      Current Diagnoses: Patient Active Problem List   Diagnosis Date Noted  . Drug-induced confusion, acute ([place]) 12/24/2015  . Overuse of medication 12/24/2015  . 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 Contusion   . Acute renal failure (ARF) ([place]) 12/19/2015  . Altered mental status 12/19/2015  . 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 Leg weakness, bilateral 01/05/2015  . Seizure ([place]) 03/24/2014  . Altered mental state 03/24/2014  . Cough 12/25/2012    Orientation RESPIRATION BLADDER Height & Weight    Self, Time, Situation, Place  Normal Continent 4\' 9"  (144.8 cm) 110 lbs.  BEHAVIORAL SYMPTOMS/MOOD NEUROLOGICAL BOWEL NUTRITION STATUS      Continent Diet (low sodium heart healthy)  AMBULATORY STATUS COMMUNICATION OF NEEDS Skin   Limited Assist Verbally Normal                       Personal Care Assistance Level of Assistance  Bathing, Dressing Bathing Assistance: Limited assistance   Dressing Assistance: Limited assistance     Functional Limitations Info             SPECIAL CARE FACTORS FREQUENCY                       Contractures      Additional Factors Info  Allergies   Allergies Info: Ibuprofen, Benztropine, Codeine           Current Medications (12/24/2015):  This is the current hospital active medication list Current  Facility-Administered Medications  Medication Dose Route Frequency Provider Last Rate Last Dose  . acetaminophen (TYLENOL) tablet 650 mg  650 mg Oral Q4H PRN Virgel Manifold, MD   650 mg at 12/24/15 0109  . clopidogrel (PLAVIX) tablet 75 mg  75 mg Oral Daily Virgel Manifold, MD   75 mg at 12/24/15 0948  . Difluprednate 0.05 % EMUL   Ophthalmic TID Virgel Manifold, MD      . DULoxetine (CYMBALTA) DR capsule 30 mg  30 mg Oral Daily Virgel Manifold, MD   30 mg at 12/24/15 0949  . folic acid (FOLVITE) tablet 1 mg  1 mg Oral Daily Virgel Manifold, MD   1 mg at 12/24/15 0949  . levothyroxine (SYNTHROID, LEVOTHROID) tablet 50 mcg  50 mcg Oral QAC breakfast Virgel Manifold, MD   50 mcg at 12/24/15 0813  . losartan (COZAAR) tablet 50 mg  50 mg Oral Daily Virgel Manifold, MD   50 mg at 12/24/15 0950  . montelukast (SINGULAIR) tablet 10 mg  10 mg Oral QHS Virgel Manifold, MD   10 mg at 12/22/15 2218  . pantoprazole (PROTONIX) EC tablet 40 mg  40 mg Oral Daily Virgel Manifold, MD   40 mg at 12/24/15 0950  . polyvinyl alcohol (LIQUIFILM TEARS) 1.4 % ophthalmic solution 1 drop  1 drop Both Eyes TID PRN Virgel Manifold, MD   1 drop at 12/23/15 2206  . potassium chloride SA (K-DUR,KLOR-CON) CR tablet 20 mEq  20 mEq Oral BID Virgel Manifold, MD   20 mEq at 12/24/15 0951  . pravastatin (PRAVACHOL) tablet 20 mg  20 mg Oral Daily Virgel Manifold, MD   20 mg at 12/24/15 0951  . Vitamin D (Ergocalciferol) (DRISDOL) capsule 50,000 Units  50,000 Units Oral Weekly Virgel Manifold, MD   50,000 Units at 12/22/15 1014  . white petrolatum (VASELINE) gel   Topical PRN Mojeed Akintayo       Current Outpatient Prescriptions  Medication Sig Dispense Refill  . albuterol (PROVENTIL) 4 MG tablet Take 4 mg by mouth 3 (three) times daily.    Marland Kitchen aspirin 81 MG tablet Take 81 mg by mouth at bedtime.    . benzonatate (TESSALON) 100 MG capsule Take 2 capsules (200 mg total) by mouth 2 (two) times daily as needed for cough. 20 capsule 0  . busPIRone (BUSPAR) 10 MG  tablet Take 10 mg by mouth 2 (two) times daily.    . clonazePAM (KLONOPIN) 1 MG tablet Take 0.5-1 tablets by mouth See admin instructions. 0.5 tablet twice daily as needed for anxiety and 1 tablet at bedtime.    . clopidogrel (PLAVIX) 75 MG tablet Take 1 tablet by mouth daily.    . Difluprednate (DUREZOL OP) Apply 1 drop to eye 3 (three) times daily.    . DULoxetine (CYMBALTA) 30 MG capsule Take 30 mg by mouth daily.  5  . ergocalciferol (VITAMIN D2) 50000 UNITS capsule Take 50,000 Units by mouth every Thursday.     . escitalopram (LEXAPRO) 10 MG tablet Take 10 mg by mouth daily.    . folic acid (FOLVITE) 1 MG tablet 1 mg daily.  11  . HYDROmorphone (DILAUDID) 2 MG tablet Take 1 tablet (2 mg total) by mouth every 6 (six) hours as needed (for cough and pain). (Patient not taking: Reported on 12/21/2015)    . hydrOXYzine (ATARAX/VISTARIL) 25 MG tablet Take 25 mg by mouth 3 (three) times daily as needed for itching.    . levothyroxine (SYNTHROID, LEVOTHROID) 50 MCG tablet Take 50 mcg by mouth daily before breakfast.     . losartan (COZAAR) 50 MG tablet Take 50 mg by mouth daily.  6  . meloxicam (MOBIC) 15 MG tablet Take 1 tablet by mouth every morning.    . methotrexate (RHEUMATREX) 2.5 MG tablet Take 15 mg by mouth once a week. Caution:Chemotherapy. Protect from light. Takes on Wednesday's    . metoprolol tartrate (LOPRESSOR) 25 MG tablet Take 1 tablet (25 mg total) by mouth 2 (two) times daily. (Patient not taking: Reported on 12/19/2015) 60 tablet 0  . mirabegron ER (MYRBETRIQ) 50 MG TB24 tablet Take 50 mg by mouth daily.    . montelukast (SINGULAIR) 10 MG tablet Take 10 mg by mouth at bedtime.    Marland Kitchen omeprazole (PRILOSEC) 20 MG capsule Take 20 mg by mouth daily.    . ondansetron (ZOFRAN ODT) 4 MG disintegrating tablet 4mg  ODT q4 hours prn nausea/vomit 4 tablet 0  . phentermine (ADIPEX-P) 37.5 MG tablet Take 1 tablet by mouth daily with breakfast.  3  . polyvinyl alcohol (LIQUIFILM TEARS) 1.4 %  ophthalmic solution Place 1 drop into both eyes 3 (three) times daily as needed for dry eyes.    . potassium chloride SA (K-DUR,KLOR-CON) 20 MEQ tablet Take 20 mEq by mouth 2 (two) times daily.    Marland Kitchen  pravastatin (PRAVACHOL) 20 MG tablet Take 20 mg by mouth daily.    . promethazine (PHENERGAN) 12.5 MG tablet Take 12.5 mg by mouth every 4 (four) hours as needed for nausea.    Marland Kitchen sulfamethoxazole-trimethoprim (BACTRIM DS,SEPTRA DS) 800-160 MG tablet Take 1 tablet by mouth 2 (two) times daily. Started 01/05 for 10 days    . zolpidem (AMBIEN) 5 MG tablet Take 1 tablet (5 mg total) by mouth at bedtime as needed for sleep. (Patient not taking: Reported on 12/19/2015) 30 tablet 0     Discharge Medications: Please see discharge summary for a list of discharge medications.  Relevant Imaging Results:  Relevant Lab Results:   Additional Information    Carlean Jews, LCSW

## 2015-12-24 NOTE — Clinical Social Work Note (Signed)
CSW spoke with EDMD to facilitate PT consult.  Trying to determine whether pt needs assisted living facility, SNF facility or inpatient psych facility.  Pt's medications are being adjusted to help determine placement as well. MD requested PT consult  .Dede Query, LCSW [facility] Clinical Social Worker - Weekend Coverage cell #: [phone]

## 2015-12-24 NOTE — Clinical Social Work Note (Addendum)
CSW sent pt information to many ALF's and SNF's  in [place], [place] and [place] area.  Awaiting PT evaluation and re assessment from psychiatric to assess for discharge plans.  CSW requested an order for a TB test for pt placement.   Dede Query, LCSW [place] Worker - Weekend Coverage cell #: [phone]  .

## 2015-12-24 NOTE — Consult Note (Signed)
[facility] - Inpatient Face-to-Face Psychiatry Consult   Reason for Consult:  Falls, confusion Referring Physician:  EDP Patient Identification: Rhonda Davenport MRN:  [id number] Principal Diagnosis: Drug-induced confusion, acute ([place]), adjustment disorder with disturbance of emotions and conduct Diagnosis:   Patient Active Problem List   Diagnosis Date Noted  . Drug-induced confusion, acute [facility]) [id number] 12/24/2015    Priority: High  . Overuse of medication [Z91.14] 12/24/2015    Priority: High  . Acute renal failure (ARF) ([place]) [N17.9] 12/19/2015  . Altered mental status [R41.82] 12/19/2015  . Gait disorder [R26.9] 01/05/2015  . Leg weakness, bilateral [R29.898] 01/05/2015  . Seizure ([place]) [R56.9] 03/24/2014  . Altered mental state [R41.82] 03/24/2014  . Cough [R05] 12/25/2012    Total Time spent with patient: 30 minutes  Subjective:   Rhonda Davenport is a 75 y.o. female patient admitted with confusion, AMS.  HPI:  Patient has began to clear mentally and can converse with minimal issues.  Her son, POA, is concerned she was taking too much of her pain and benzodiazepines which were not continued.  He also reported her having fall issues with Buspar, medication discontinued along with PRN Phenergan and Vistaril due to fall concerns.  Patient is agreeable to go to an assisted living where her family has been wanting her to go.  Lumbar Xray ordered due to complaints of pain from multiple falls prior to admission.  Past Psychiatric History: None  Risk to Self: Suicidal Ideation: No Suicidal Intent: No Is patient at risk for suicide?: No Suicidal Plan?: No Access to Means: No What has been your use of drugs/alcohol within the last 12 months?: Denies How many times?: 0 Other Self Harm Risks: None Triggers for Past Attempts: Unknown Intentional Self Injurious Behavior: None Risk to Others: Homicidal Ideation: No Thoughts of Harm to Others: No Current Homicidal Intent: No Current Homicidal Plan:  No Access to Homicidal Means: No Identified Victim: NA History of harm to others?: No Assessment of Violence: None Noted Violent Behavior Description: NA Does patient have access to weapons?: No Criminal Charges Pending?: No Does patient have a court date: No Prior Inpatient Therapy: Prior Inpatient Therapy: No Prior Therapy Dates: na Prior Therapy Facilty/Provider(s): na Reason for Treatment: na Prior Outpatient Therapy: Prior Outpatient Therapy: No Prior Therapy Dates: na Prior Therapy Facilty/Provider(s): unknown Reason for Treatment: NA Does patient have an ACCT team?: No Does patient have Intensive In-House Services?  : No Does patient have [facility] services? : No Does patient have P4CC services?: No  Past Medical History:  Past Medical History  Diagnosis Date  . Hypertension   . High cholesterol   . 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 GERD (gastroesophageal reflux disease)   . Anemia   . Thyroid disease     Hypothyroidism  . H/O blood clots     pt states had blood clot in her rt arm  . Fall   . 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 Chronic low back pain     Past Surgical History  Procedure Laterality Date  . Joint replacement      TKR, left  . Abdominal hysterectomy    . Appendectomy    . Shoulder surgery      Bilateral  . Carpal tunnel release Bilateral   . Tonsillectomy     Family History:  Family History  Problem Relation Age of Onset  . Rheum arthritis Mother   . Rheum arthritis Father   . Healthy Brother    Family Psychiatric  History:  None Social History:  History  Alcohol Use No     History  Drug Use No    Social History   Social History  . Marital Status: Widowed    Spouse Name: N/A  . Number of Children: 0  . Years of Education: N/A   Occupational History  . Retired     Press photographer   Social History Main Topics  . Smoking status: Former Smoker -- 1.00 packs/day for 26 years    Types: Cigarettes    Quit date: 12/10/1982  . Smokeless tobacco: Never Used   . Alcohol Use: No  . Drug Use: No  . Sexual Activity: Not Asked   Other Topics Concern  . None   Social History Narrative   Patient is right handed.   Patient drinks 1 cup caffeine daily   Additional Social History:    Pain Medications: See MAR Prescriptions: See MAR Over the Counter: See MAR History of alcohol / drug use?: No history of alcohol / drug abuse Longest period of sobriety (when/how long): na                     Allergies:   Allergies  Allergen Reactions  . Ibuprofen Itching  . Benztropine Rash  . Codeine Palpitations    Labs: No results found for this or any previous visit (from the past 48 hour(s)).  Current Facility-Administered Medications  Medication Dose Route Frequency Provider Last Rate Last Dose  . acetaminophen (TYLENOL) tablet 650 mg  650 mg Oral Q4H PRN Virgel Manifold, MD   650 mg at 12/24/15 0109  . clopidogrel (PLAVIX) tablet 75 mg  75 mg Oral Daily Virgel Manifold, MD   75 mg at 12/24/15 0948  . Difluprednate 0.05 % EMUL   Ophthalmic TID Virgel Manifold, MD      . DULoxetine (CYMBALTA) DR capsule 30 mg  30 mg Oral Daily Virgel Manifold, MD   30 mg at 12/24/15 0949  . folic acid (FOLVITE) tablet 1 mg  1 mg Oral Daily Virgel Manifold, MD   1 mg at 12/24/15 0949  . levothyroxine (SYNTHROID, LEVOTHROID) tablet 50 mcg  50 mcg Oral QAC breakfast Virgel Manifold, MD   50 mcg at 12/24/15 0813  . losartan (COZAAR) tablet 50 mg  50 mg Oral Daily Virgel Manifold, MD   50 mg at 12/24/15 0950  . montelukast (SINGULAIR) tablet 10 mg  10 mg Oral QHS Virgel Manifold, MD   10 mg at 12/22/15 2218  . pantoprazole (PROTONIX) EC tablet 40 mg  40 mg Oral Daily Virgel Manifold, MD   40 mg at 12/24/15 0950  . polyvinyl alcohol (LIQUIFILM TEARS) 1.4 % ophthalmic solution 1 drop  1 drop Both Eyes TID PRN Virgel Manifold, MD   1 drop at 12/23/15 2206  . potassium chloride SA (K-DUR,KLOR-CON) CR tablet 20 mEq  20 mEq Oral BID Virgel Manifold, MD   20 mEq at 12/24/15 0951  . pravastatin  (PRAVACHOL) tablet 20 mg  20 mg Oral Daily Virgel Manifold, MD   20 mg at 12/24/15 0951  . Vitamin D (Ergocalciferol) (DRISDOL) capsule 50,000 Units  50,000 Units Oral Weekly Virgel Manifold, MD   50,000 Units at 12/22/15 1014  . white petrolatum (VASELINE) gel   Topical PRN Mojeed Akintayo       Current Outpatient Prescriptions  Medication Sig Dispense Refill  . albuterol (PROVENTIL) 4 MG tablet Take 4 mg by mouth 3 (three) times daily.    Marland Kitchen aspirin 81  MG tablet Take 81 mg by mouth at bedtime.    . benzonatate (TESSALON) 100 MG capsule Take 2 capsules (200 mg total) by mouth 2 (two) times daily as needed for cough. 20 capsule 0  . busPIRone (BUSPAR) 10 MG tablet Take 10 mg by mouth 2 (two) times daily.    . clonazePAM (KLONOPIN) 1 MG tablet Take 0.5-1 tablets by mouth See admin instructions. 0.5 tablet twice daily as needed for anxiety and 1 tablet at bedtime.    . clopidogrel (PLAVIX) 75 MG tablet Take 1 tablet by mouth daily.    . Difluprednate (DUREZOL OP) Apply 1 drop to eye 3 (three) times daily.    . DULoxetine (CYMBALTA) 30 MG capsule Take 30 mg by mouth daily.  5  . ergocalciferol (VITAMIN D2) 50000 UNITS capsule Take 50,000 Units by mouth every Thursday.     . escitalopram (LEXAPRO) 10 MG tablet Take 10 mg by mouth daily.    . folic acid (FOLVITE) 1 MG tablet 1 mg daily.  11  . HYDROmorphone (DILAUDID) 2 MG tablet Take 1 tablet (2 mg total) by mouth every 6 (six) hours as needed (for cough and pain). (Patient not taking: Reported on 12/21/2015)    . hydrOXYzine (ATARAX/VISTARIL) 25 MG tablet Take 25 mg by mouth 3 (three) times daily as needed for itching.    . levothyroxine (SYNTHROID, LEVOTHROID) 50 MCG tablet Take 50 mcg by mouth daily before breakfast.     . losartan (COZAAR) 50 MG tablet Take 50 mg by mouth daily.  6  . meloxicam (MOBIC) 15 MG tablet Take 1 tablet by mouth every morning.    . methotrexate (RHEUMATREX) 2.5 MG tablet Take 15 mg by mouth once a week. Caution:Chemotherapy.  Protect from light. Takes on Wednesday's    . metoprolol tartrate (LOPRESSOR) 25 MG tablet Take 1 tablet (25 mg total) by mouth 2 (two) times daily. (Patient not taking: Reported on 12/19/2015) 60 tablet 0  . mirabegron ER (MYRBETRIQ) 50 MG TB24 tablet Take 50 mg by mouth daily.    . montelukast (SINGULAIR) 10 MG tablet Take 10 mg by mouth at bedtime.    Marland Kitchen omeprazole (PRILOSEC) 20 MG capsule Take 20 mg by mouth daily.    . ondansetron (ZOFRAN ODT) 4 MG disintegrating tablet 4mg  ODT q4 hours prn nausea/vomit 4 tablet 0  . phentermine (ADIPEX-P) 37.5 MG tablet Take 1 tablet by mouth daily with breakfast.  3  . polyvinyl alcohol (LIQUIFILM TEARS) 1.4 % ophthalmic solution Place 1 drop into both eyes 3 (three) times daily as needed for dry eyes.    . potassium chloride SA (K-DUR,KLOR-CON) 20 MEQ tablet Take 20 mEq by mouth 2 (two) times daily.    . pravastatin (PRAVACHOL) 20 MG tablet Take 20 mg by mouth daily.    . promethazine (PHENERGAN) 12.5 MG tablet Take 12.5 mg by mouth every 4 (four) hours as needed for nausea.    Marland Kitchen sulfamethoxazole-trimethoprim (BACTRIM DS,SEPTRA DS) 800-160 MG tablet Take 1 tablet by mouth 2 (two) times daily. Started 01/05 for 10 days    . zolpidem (AMBIEN) 5 MG tablet Take 1 tablet (5 mg total) by mouth at bedtime as needed for sleep. (Patient not taking: Reported on 12/19/2015) 30 tablet 0    Musculoskeletal: Strength & Muscle Tone: within normal limits and decreased Gait & Station: unsteady Patient leans: N/A  Psychiatric Specialty Exam: Review of Systems  Constitutional: Negative.   HENT: Negative.   Eyes: Negative.   Respiratory: Negative.  Cardiovascular: Negative.   Gastrointestinal: Negative.   Genitourinary: Negative.   Musculoskeletal: Negative.   Skin: Negative.   Neurological: Negative.   Endo/Heme/Allergies: Negative.   Psychiatric/Behavioral:       Mild confusion    Blood pressure 122/73, pulse 115, temperature 98.6 F (37 C), temperature  source Oral, resp. rate 18, height 4\' 9"  (1.448 m), weight 49.896 kg (110 lb), SpO2 95 %.Body mass index is 23.8 kg/(m^2).  General Appearance: Casual  Eye Contact::  Good  Speech:  Clear and Coherent  Volume:  Normal  Mood:  Euthymic  Affect:  Congruent  Thought Process:  Coherent  Orientation:  Other:  person  Thought Content:  WDL  Suicidal Thoughts:  No  Homicidal Thoughts:  No  Memory:  Immediate;   Poor Recent;   Poor Remote;   Poor  Judgement:  Impaired  Insight:  Fair  Psychomotor Activity:  Decreased  Concentration:  Fair  Recall:  Poor  [employer] [place]  Language: Fair  Akathisia:  No  Handed:  Right  AIMS (if indicated):     Assets:  Housing Leisure Time Resilience Social Support  ADL's:  Intact  Cognition: Impaired,  Moderate  Sleep:      Treatment Plan Summary: Daily contact with patient to assess and evaluate symptoms and progress in treatment, Medication management and Plan acute confusion, medication related and adjustment disorder with disturbance of emotions and conduct: -Crisis stabilization -Medication management:  Buspar 10 mg BID for anxiety, Phenergan PRN for nausea, and Vistaril PRN anxiety discontinued due to increase fall risks.  Medical medications continued -Lumbar xray ordered -Individual and family assessment/counseling  Disposition: Recommend psychiatric Inpatient admission when medically cleared.  Waylan Boga, [place] 12/24/2015 3:26 PM  Patient case reviewed - agree with consultation note as above  Neita Garnet, MD

## 2015-12-24 NOTE — ED Notes (Signed)
Throughout the night pt was calm, pleasant and cooperative. C/c 'low back pain that I've had for quite sometime' that she was given tylenol prn for.  Neuro: awake, alert and oriented to person, place ("hospital in [place]") and year ("January 2017"). Also stated President ("Trump in a few days").  CV : regular pulses, no pedal edema Pulm: nonlabored respirations, satting well on room air >95%.  GI: Good appetite, ate > 75% of dinner and drank a few cups of water throughout the night.  GU: Urinates with difficulty.  Skin : Scattered bruising thoughout, specifically upper extremities.   Psych: Good eye contact throughout several conversations. Continued to deny SI/HI. Questionable memory regarding the scenario around her falls. "My family is going to start coming by and helping me more so I can go home tomorrow."  She was very pleasant to speak with. She slept well throughout the night, however, she awoke each time the automatic doors would open and close. After closing her room door with sitter, she had a few hours of solid un-interrupted sleep.

## 2015-12-24 NOTE — ED Notes (Signed)
Pt sitting in chair talking to brother. Sitter at bedside

## 2015-12-24 NOTE — ED Notes (Signed)
Pt alert to person place year. She was able to take a shower with assistance from tech, needs assistance with walking unsteady on feet. Pt was trying to call her brother earlier could not remember his number.

## 2015-12-25 DIAGNOSIS — T148 Other injury of unspecified body region: Secondary | ICD-10-CM | POA: Diagnosis not present

## 2015-12-25 DIAGNOSIS — F19988 Other psychoactive substance use, unspecified with other psychoactive substance-induced disorder: Secondary | ICD-10-CM | POA: Diagnosis not present

## 2015-12-25 DIAGNOSIS — F1310 Sedative, hypnotic or anxiolytic abuse, uncomplicated: Secondary | ICD-10-CM | POA: Diagnosis not present

## 2015-12-25 DIAGNOSIS — Z45018 Encounter for adjustment and management of other part of cardiac pacemaker: Secondary | ICD-10-CM | POA: Diagnosis not present

## 2015-12-25 LAB — CBC WITH DIFFERENTIAL/PLATELET
BASOS ABS: 0 10*3/uL (ref 0.0–0.1)
BASOS PCT: 0 %
Eosinophils Absolute: 0.1 10*3/uL (ref 0.0–0.7)
Eosinophils Relative: 1 %
HEMATOCRIT: 44.4 % (ref 36.0–46.0)
HEMOGLOBIN: 15.2 g/dL — AB (ref 12.0–15.0)
LYMPHS PCT: 27 %
Lymphs Abs: 3.4 10*3/uL (ref 0.7–4.0)
MCH: 31.7 pg (ref 26.0–34.0)
MCHC: 34.2 g/dL (ref 30.0–36.0)
MCV: 92.5 fL (ref 78.0–100.0)
MONO ABS: 1 10*3/uL (ref 0.1–1.0)
Monocytes Relative: 8 %
NEUTROS ABS: 7.8 10*3/uL — AB (ref 1.7–7.7)
NEUTROS PCT: 64 %
Platelets: 424 10*3/uL — ABNORMAL HIGH (ref 150–400)
RBC: 4.8 MIL/uL (ref 3.87–5.11)
RDW: 12.7 % (ref 11.5–15.5)
WBC: 12.3 10*3/uL — ABNORMAL HIGH (ref 4.0–10.5)

## 2015-12-25 LAB — PROTIME-INR
INR: 1.08 (ref 0.00–1.49)
Prothrombin Time: 14.2 seconds (ref 11.6–15.2)

## 2015-12-25 MED ORDER — GI COCKTAIL ~~LOC~~
30.0000 mL | Freq: Once | ORAL | Status: AC
  Administered 2015-12-25: 30 mL via ORAL
  Filled 2015-12-25: qty 30

## 2015-12-25 MED ORDER — TUBERCULIN PPD 5 UNIT/0.1ML ID SOLN
5.0000 [IU] | Freq: Once | INTRADERMAL | Status: DC
  Administered 2015-12-25: 5 [IU] via INTRADERMAL
  Filled 2015-12-25: qty 0.1

## 2015-12-25 MED ORDER — MIRTAZAPINE 7.5 MG PO TABS
7.5000 mg | ORAL_TABLET | Freq: Every day | ORAL | Status: DC
  Administered 2015-12-25: 7.5 mg via ORAL
  Filled 2015-12-25 (×2): qty 1

## 2015-12-25 NOTE — Consult Note (Signed)
[facility] Face-to-Face Psychiatry Consult   Reason for Consult:  Falls, confusion Referring Physician:  EDP Patient Identification: Rhonda Davenport MRN:  [id number] Principal Diagnosis: Adjustment and management of cardiac pacemaker, adjustment disorder with disturbance of emotions and conduct Diagnosis:   Patient Active Problem List   Diagnosis Date Noted  . Drug-induced confusion, acute [facility]) [id number] 12/24/2015    Priority: High  . Overuse of medication [Z91.14] 12/24/2015    Priority: High  . Adjustment and management of cardiac pacemaker [Z45.018] 12/24/2015    Priority: High  . Contusion [T14.8]   . Acute renal failure (ARF) ([place]) [N17.9] 12/19/2015  . Altered mental status [R41.82] 12/19/2015  . Gait disorder [R26.9] 01/05/2015  . Leg weakness, bilateral [R29.898] 01/05/2015  . Seizure ([place]) [R56.9] 03/24/2014  . Altered mental state [R41.82] 03/24/2014  . Cough [R05] 12/25/2012    Total Time spent with patient: 30 minutes  Subjective:   Rhonda Davenport is a 75 y.o. female patient does not warrant psychiatric admission and will be released from psychiatric services at this time  HPI:  Patient is alert and oriented today and continues to want assisted living.  She denies depression, suicidal/homicidal ideations, hallucinations, and alcohol/drug abuse.  Rhonda Davenport does not meet criteria for a psychiatric admission at this time.  Past Psychiatric History: None  Risk to Self: Suicidal Ideation: No Suicidal Intent: No Is patient at risk for suicide?: No Suicidal Plan?: No Access to Means: No What has been your use of drugs/alcohol within the last 12 months?: Denies How many times?: 0 Other Self Harm Risks: None Triggers for Past Attempts: Unknown Intentional Self Injurious Behavior: None Risk to Others: Homicidal Ideation: No Thoughts of Harm to Others: No Current Homicidal Intent: No Current Homicidal Plan: No Access to Homicidal Means: No Identified Victim: NA History  of harm to others?: No Assessment of Violence: None Noted Violent Behavior Description: NA Does patient have access to weapons?: No Criminal Charges Pending?: No Does patient have a court date: No Prior Inpatient Therapy: Prior Inpatient Therapy: No Prior Therapy Dates: na Prior Therapy Facilty/Provider(s): na Reason for Treatment: na Prior Outpatient Therapy: Prior Outpatient Therapy: No Prior Therapy Dates: na Prior Therapy Facilty/Provider(s): unknown Reason for Treatment: NA Does patient have an ACCT team?: No Does patient have Intensive In-House Services?  : No Does patient have [facility] services? : No Does patient have P4CC services?: No  Past Medical History:  Past Medical History  Diagnosis Date  . Hypertension   . High cholesterol   . 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 GERD (gastroesophageal reflux disease)   . Anemia   . Thyroid disease     Hypothyroidism  . H/O blood clots     pt states had blood clot in her rt arm  . Fall   . 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 Chronic low back pain     Past Surgical History  Procedure Laterality Date  . Joint replacement      TKR, left  . Abdominal hysterectomy    . Appendectomy    . Shoulder surgery      Bilateral  . Carpal tunnel release Bilateral   . Tonsillectomy     Family History:  Family History  Problem Relation Age of Onset  . Rheum arthritis Mother   . Rheum arthritis Father   . Healthy Brother    Family Psychiatric  History: None Social History:  History  Alcohol Use No     History  Drug Use No  Social History   Social History  . Marital Status: Widowed    Spouse Name: N/A  . Number of Children: 0  . Years of Education: N/A   Occupational History  . Retired     Press photographer   Social History Main Topics  . Smoking status: Former Smoker -- 1.00 packs/day for 26 years    Types: Cigarettes    Quit date: 12/10/1982  . Smokeless tobacco: Never Used  . Alcohol Use: No  . Drug Use: No  . Sexual Activity: Not  Asked   Other Topics Concern  . None   Social History Narrative   Patient is right handed.   Patient drinks 1 cup caffeine daily   Additional Social History:    Pain Medications: See MAR Prescriptions: See MAR Over the Counter: See MAR History of alcohol / drug use?: No history of alcohol / drug abuse Longest period of sobriety (when/how long): na                     Allergies:   Allergies  Allergen Reactions  . Ibuprofen Itching  . Benztropine Rash  . Codeine Palpitations    Labs: No results found for this or any previous visit (from the past 48 hour(s)).  Current Facility-Administered Medications  Medication Dose Route Frequency Provider Last Rate Last Dose  . acetaminophen (TYLENOL) tablet 650 mg  650 mg Oral Q4H PRN Virgel Manifold, MD   650 mg at 12/25/15 0255  . clopidogrel (PLAVIX) tablet 75 mg  75 mg Oral Daily Virgel Manifold, MD   75 mg at 12/25/15 1041  . Difluprednate 0.05 % EMUL   Ophthalmic TID Virgel Manifold, MD      . DULoxetine (CYMBALTA) DR capsule 30 mg  30 mg Oral Daily Virgel Manifold, MD   30 mg at 12/25/15 1041  . folic acid (FOLVITE) tablet 1 mg  1 mg Oral Daily Virgel Manifold, MD   1 mg at 12/25/15 1113  . levothyroxine (SYNTHROID, LEVOTHROID) tablet 50 mcg  50 mcg Oral QAC breakfast Virgel Manifold, MD   50 mcg at 12/25/15 0758  . losartan (COZAAR) tablet 50 mg  50 mg Oral Daily Virgel Manifold, MD   50 mg at 12/25/15 1041  . montelukast (SINGULAIR) tablet 10 mg  10 mg Oral QHS Virgel Manifold, MD   10 mg at 12/24/15 2209  . pantoprazole (PROTONIX) EC tablet 40 mg  40 mg Oral Daily Virgel Manifold, MD   40 mg at 12/25/15 1113  . polyvinyl alcohol (LIQUIFILM TEARS) 1.4 % ophthalmic solution 1 drop  1 drop Both Eyes TID PRN Virgel Manifold, MD   1 drop at 12/23/15 2206  . potassium chloride SA (K-DUR,KLOR-CON) CR tablet 20 mEq  20 mEq Oral BID Virgel Manifold, MD   20 mEq at 12/25/15 1041  . pravastatin (PRAVACHOL) tablet 20 mg  20 mg Oral Daily Virgel Manifold, MD    20 mg at 12/25/15 1041  . Vitamin D (Ergocalciferol) (DRISDOL) capsule 50,000 Units  50,000 Units Oral Weekly Virgel Manifold, MD   50,000 Units at 12/22/15 1014  . white petrolatum (VASELINE) gel   Topical PRN Ajah Vanhoose       Current Outpatient Prescriptions  Medication Sig Dispense Refill  . albuterol (PROVENTIL) 4 MG tablet Take 4 mg by mouth 3 (three) times daily.    Marland Kitchen aspirin 81 MG tablet Take 81 mg by mouth at bedtime.    . benzonatate (TESSALON) 100 MG capsule Take 2 capsules (  200 mg total) by mouth 2 (two) times daily as needed for cough. 20 capsule 0  . busPIRone (BUSPAR) 10 MG tablet Take 10 mg by mouth 2 (two) times daily.    . clonazePAM (KLONOPIN) 1 MG tablet Take 0.5-1 tablets by mouth See admin instructions. 0.5 tablet twice daily as needed for anxiety and 1 tablet at bedtime.    . clopidogrel (PLAVIX) 75 MG tablet Take 1 tablet by mouth daily.    . Difluprednate (DUREZOL OP) Apply 1 drop to eye 3 (three) times daily.    . DULoxetine (CYMBALTA) 30 MG capsule Take 30 mg by mouth daily.  5  . ergocalciferol (VITAMIN D2) 50000 UNITS capsule Take 50,000 Units by mouth every Thursday.     . escitalopram (LEXAPRO) 10 MG tablet Take 10 mg by mouth daily.    . folic acid (FOLVITE) 1 MG tablet 1 mg daily.  11  . HYDROmorphone (DILAUDID) 2 MG tablet Take 1 tablet (2 mg total) by mouth every 6 (six) hours as needed (for cough and pain). (Patient not taking: Reported on 12/21/2015)    . hydrOXYzine (ATARAX/VISTARIL) 25 MG tablet Take 25 mg by mouth 3 (three) times daily as needed for itching.    . levothyroxine (SYNTHROID, LEVOTHROID) 50 MCG tablet Take 50 mcg by mouth daily before breakfast.     . losartan (COZAAR) 50 MG tablet Take 50 mg by mouth daily.  6  . meloxicam (MOBIC) 15 MG tablet Take 1 tablet by mouth every morning.    . methotrexate (RHEUMATREX) 2.5 MG tablet Take 15 mg by mouth once a week. Caution:Chemotherapy. Protect from light. Takes on Wednesday's    . metoprolol  tartrate (LOPRESSOR) 25 MG tablet Take 1 tablet (25 mg total) by mouth 2 (two) times daily. (Patient not taking: Reported on 12/19/2015) 60 tablet 0  . mirabegron ER (MYRBETRIQ) 50 MG TB24 tablet Take 50 mg by mouth daily.    . montelukast (SINGULAIR) 10 MG tablet Take 10 mg by mouth at bedtime.    Marland Kitchen omeprazole (PRILOSEC) 20 MG capsule Take 20 mg by mouth daily.    . ondansetron (ZOFRAN ODT) 4 MG disintegrating tablet 4mg  ODT q4 hours prn nausea/vomit 4 tablet 0  . phentermine (ADIPEX-P) 37.5 MG tablet Take 1 tablet by mouth daily with breakfast.  3  . polyvinyl alcohol (LIQUIFILM TEARS) 1.4 % ophthalmic solution Place 1 drop into both eyes 3 (three) times daily as needed for dry eyes.    . potassium chloride SA (K-DUR,KLOR-CON) 20 MEQ tablet Take 20 mEq by mouth 2 (two) times daily.    . pravastatin (PRAVACHOL) 20 MG tablet Take 20 mg by mouth daily.    . promethazine (PHENERGAN) 12.5 MG tablet Take 12.5 mg by mouth every 4 (four) hours as needed for nausea.    Marland Kitchen sulfamethoxazole-trimethoprim (BACTRIM DS,SEPTRA DS) 800-160 MG tablet Take 1 tablet by mouth 2 (two) times daily. Started 01/05 for 10 days    . zolpidem (AMBIEN) 5 MG tablet Take 1 tablet (5 mg total) by mouth at bedtime as needed for sleep. (Patient not taking: Reported on 12/19/2015) 30 tablet 0    Musculoskeletal: Strength & Muscle Tone: WDL Gait & Station: unsteady at times Patient leans: N/A  Psychiatric Specialty Exam: Review of Systems  Constitutional: Negative.   HENT: Negative.   Eyes: Negative.   Respiratory: Negative.   Cardiovascular: Negative.   Gastrointestinal: Negative.   Genitourinary: Negative.   Musculoskeletal: Negative.   Skin: Negative.   Neurological:  Negative.   Endo/Heme/Allergies: Negative.   Psychiatric/Behavioral:       Mild confusion    Blood pressure 126/83, pulse 118, temperature 98.5 F (36.9 C), temperature source Oral, resp. rate 16, height 4\' 9"  (1.448 m), weight 49.896 kg (110 lb), SpO2  95 %.Body mass index is 23.8 kg/(m^2).  General Appearance: Casual  Eye Contact::  Good  Speech:  Clear and Coherent  Volume:  Normal  Mood:  Euthymic  Affect:  Congruent  Thought Process:  Coherent  Orientation:  Other:  person  Thought Content:  WDL  Suicidal Thoughts:  No  Homicidal Thoughts:  No  Memory: Immediate, fair; recent, fair; remote, good  Judgement:  Fair  Insight:  Fair  Psychomotor Activity:  Normal  Concentration:  Good  Recall:  [place] of Knowledge:Fair  Language: Good  Akathisia:  No  Handed:  Right  AIMS (if indicated):     Assets:  Housing Leisure Time Resilience Social Support  ADL's:  Intact  Cognition: WDL  Sleep:      Treatment Plan Summary: Daily contact with patient to assess and evaluate symptoms and progress in treatment, Medication management and Plan acute confusion, medication related and adjustment disorder with disturbance of emotions and conduct: -Crisis stabilization -Medication management:   Medical medications continued along with her Cymbalta 30 mg daily for depression -Individual counseling  Disposition: Release from psychiatry to Education officer, museum for assisted living facility placement  Waylan Boga, [place] 12/25/2015 4:27 PM Patient seen face-to-face for psychiatric evaluation, chart reviewed and case discussed with the physician extender and developed treatment plan. Reviewed the information documented and agree with the treatment plan. Corena Pilgrim, MD

## 2015-12-25 NOTE — ED Notes (Signed)
Pt alert and oriented x4. Respirations even and unlabored, bilateral symmetrical rise and fall of chest. Skin warm and dry. In no acute distress. Denies needs.   

## 2015-12-25 NOTE — ED Notes (Addendum)
Pt is pleasant and cooperative. Pt was given breakfast. Pt is alert to person but unsure of the year and unsure of her birthdate. pt is not able to tell [place] where she lives . She does know who the president is but stated the year is 2010. Pt brother came to visit. 11:40am _Pt is OOB in the cardiac chair. pts brother was to speak to the MD. Informed brother that the social worker will come and talk to him. Pts niece is visiting now. ( 12noon) Phoned TTS and Rollene Fare, Education officer, museum , will come and see the pt. Phoned NP who will order the pt medication to help her sleep this pm. (2pm)pt is taking a shower. 3:30p Pt is asleep in bed. She s/p washed her hair and showered with minimal assistance. 5;30p Phoned EDP concerning multilpe echmotic areas on pts arms. One the size of a baseball on her right deltoid area. Pt denies any recent falls or injuries. Blood work drawn and obtained per EDP. (5:30pm)(  6:30pm )_Pt received her TB test on the right inner aspect of her arm. Yellow form filled out and placed in the pts chart(. 6:45pm) _EDP aware of blood work results.

## 2015-12-25 NOTE — Clinical Social Work Note (Signed)
Pt has a previous pasaar for SNF [id number].  CSW did not change level in [place] must yet until a final plan for  ALF versus SNF is decided tomorrow.    Dede Query, LCSW [place] Worker - Weekend Coverage cell #: [phone]

## 2015-12-25 NOTE — ED Notes (Signed)
Pt c/o indigestion and feel bloated after eating chicken earlier today. She states she takes a medication at home but unable to state the name of the medication. MD aware. Will do EKG and administer GI cocktail and f/u.

## 2015-12-25 NOTE — Clinical Social Work Note (Addendum)
CSW spoke with Tress at [facility] [phone].  She stated that they had a bed at the assisted living and also had one at the memory care units.  She stated that they would come out to hospital tomorrow to assess pt. Room number provided and  CSW sent pt information through the [facility]. Pt's brother and sister in law updated.  This facility will allow pt's dog to come with her and this is a NECESSITY  For pt.  Dede Query, LCSW [place] Worker - Weekend Coverage cell #: [phone]

## 2015-12-25 NOTE — ED Notes (Signed)
Bed: WA31 Expected date:  Expected time:  Means of arrival:  Comments: 

## 2015-12-25 NOTE — ED Notes (Signed)
Pt has no belongings, family took them home on Wednesday 1/11

## 2015-12-26 DIAGNOSIS — T148 Other injury of unspecified body region: Secondary | ICD-10-CM | POA: Diagnosis not present

## 2015-12-26 NOTE — NC FL2 (Signed)
[place] MEDICAID FL2 LEVEL OF CARE SCREENING TOOL     IDENTIFICATION  Patient Name: Rhonda Davenport Birthdate: 04-15-1941 Sex: female Admission Date (Current Location): 12/21/2015  [facility] and [place] Number:  Herbalist and Address:  [facility],  [place] [address] [place]      Provider Number: [id number]  Attending Physician Name and Address:  Provider Default, MD  Relative Name and Phone Number:       Current Level of Care: Hospital Recommended Level of Care:   Prior Approval Number:    Date Approved/Denied:   PASRR Number:    Discharge Plan:      Current Diagnoses: Patient Active Problem List   Diagnosis Date Noted  . Drug-induced confusion, acute ([place]) 12/24/2015  . Overuse of medication 12/24/2015  . 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 Contusion   . Acute renal failure (ARF) ([place]) 12/19/2015  . Altered mental status 12/19/2015  . 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 Leg weakness, bilateral 01/05/2015  . Seizure ([place]) 03/24/2014  . Altered mental state 03/24/2014  . Cough 12/25/2012    Orientation RESPIRATION BLADDER Height & Weight    Self, Time, Situation, Place  Normal Continent 4\' 9"  (144.8 cm) 110 lbs.  BEHAVIORAL SYMPTOMS/MOOD NEUROLOGICAL BOWEL NUTRITION STATUS      Continent Diet (low sodium heart healthy)  AMBULATORY STATUS COMMUNICATION OF NEEDS Skin   Limited Assist Verbally Normal                       Personal Care Assistance Level of Assistance  Bathing, Dressing Bathing Assistance: Limited assistance   Dressing Assistance: Limited assistance     Functional Limitations Info             SPECIAL CARE FACTORS FREQUENCY                       Contractures      Additional Factors Info  Allergies   Allergies Info: Ibuprofen, Benztropine, Codeine           Current Medications (12/26/2015):  This is the current hospital active medication list Current  Facility-Administered Medications  Medication Dose Route Frequency Provider Last Rate Last Dose  . acetaminophen (TYLENOL) tablet 650 mg  650 mg Oral Q4H PRN Virgel Manifold, MD   650 mg at 12/25/15 0255  . clopidogrel (PLAVIX) tablet 75 mg  75 mg Oral Daily Virgel Manifold, MD   75 mg at 12/26/15 0831  . Difluprednate 0.05 % EMUL   Ophthalmic TID Virgel Manifold, MD      . DULoxetine (CYMBALTA) DR capsule 30 mg  30 mg Oral Daily Virgel Manifold, MD   30 mg at 12/26/15 0831  . folic acid (FOLVITE) tablet 1 mg  1 mg Oral Daily Virgel Manifold, MD   1 mg at 12/26/15 1000  . levothyroxine (SYNTHROID, LEVOTHROID) tablet 50 mcg  50 mcg Oral QAC breakfast Virgel Manifold, MD   50 mcg at 12/26/15 0831  . losartan (COZAAR) tablet 50 mg  50 mg Oral Daily Virgel Manifold, MD   50 mg at 12/26/15 [id number]  . mirtazapine (REMERON) tablet 7.5 mg  7.5 mg Oral QHS Patrecia Pour, NP   7.5 mg at 12/25/15 2154  . montelukast (SINGULAIR) tablet 10 mg  10 mg Oral QHS Virgel Manifold, MD   10 mg at 12/25/15 2154  . pantoprazole (PROTONIX) EC tablet 40 mg  40 mg Oral Daily  Virgel Manifold, MD   40 mg at 12/26/15 [id number]  . polyvinyl alcohol (LIQUIFILM TEARS) 1.4 % ophthalmic solution 1 drop  1 drop Both Eyes TID PRN Virgel Manifold, MD   1 drop at 12/25/15 2154  . potassium chloride SA (K-DUR,KLOR-CON) CR tablet 20 mEq  20 mEq Oral BID Virgel Manifold, MD   20 mEq at 12/26/15 0831  . pravastatin (PRAVACHOL) tablet 20 mg  20 mg Oral Daily Virgel Manifold, MD   20 mg at 12/26/15 [id number]  . tuberculin injection 5 Units  5 Units Intradermal Once Patrecia Pour, NP   5 Units at 12/25/15 1824  . Vitamin D (Ergocalciferol) (DRISDOL) capsule 50,000 Units  50,000 Units Oral Weekly Virgel Manifold, MD   50,000 Units at 12/22/15 1014  . white petrolatum (VASELINE) gel   Topical PRN Mojeed Akintayo       Current Outpatient Prescriptions  Medication Sig Dispense Refill  . albuterol (PROVENTIL) 4 MG tablet Take 4 mg by mouth 3 (three) times daily.    Marland Kitchen aspirin 81  MG tablet Take 81 mg by mouth at bedtime.    . benzonatate (TESSALON) 100 MG capsule Take 2 capsules (200 mg total) by mouth 2 (two) times daily as needed for cough. 20 capsule 0  . busPIRone (BUSPAR) 10 MG tablet Take 10 mg by mouth 2 (two) times daily.    . clonazePAM (KLONOPIN) 1 MG tablet Take 0.5-1 tablets by mouth See admin instructions. 0.5 tablet twice daily as needed for anxiety and 1 tablet at bedtime.    . clopidogrel (PLAVIX) 75 MG tablet Take 1 tablet by mouth daily.    . Difluprednate (DUREZOL OP) Apply 1 drop to eye 3 (three) times daily.    . DULoxetine (CYMBALTA) 30 MG capsule Take 30 mg by mouth daily.  5  . ergocalciferol (VITAMIN D2) 50000 UNITS capsule Take 50,000 Units by mouth every Thursday.     . escitalopram (LEXAPRO) 10 MG tablet Take 10 mg by mouth daily.    . folic acid (FOLVITE) 1 MG tablet 1 mg daily.  11  . HYDROmorphone (DILAUDID) 2 MG tablet Take 1 tablet (2 mg total) by mouth every 6 (six) hours as needed (for cough and pain). (Patient not taking: Reported on 12/21/2015)    . hydrOXYzine (ATARAX/VISTARIL) 25 MG tablet Take 25 mg by mouth 3 (three) times daily as needed for itching.    . levothyroxine (SYNTHROID, LEVOTHROID) 50 MCG tablet Take 50 mcg by mouth daily before breakfast.     . losartan (COZAAR) 50 MG tablet Take 50 mg by mouth daily.  6  . meloxicam (MOBIC) 15 MG tablet Take 1 tablet by mouth every morning.    . methotrexate (RHEUMATREX) 2.5 MG tablet Take 15 mg by mouth once a week. Caution:Chemotherapy. Protect from light. Takes on Wednesday's    . metoprolol tartrate (LOPRESSOR) 25 MG tablet Take 1 tablet (25 mg total) by mouth 2 (two) times daily. (Patient not taking: Reported on 12/19/2015) 60 tablet 0  . mirabegron ER (MYRBETRIQ) 50 MG TB24 tablet Take 50 mg by mouth daily.    . montelukast (SINGULAIR) 10 MG tablet Take 10 mg by mouth at bedtime.    Marland Kitchen omeprazole (PRILOSEC) 20 MG capsule Take 20 mg by mouth daily.    . ondansetron (ZOFRAN ODT) 4 MG  disintegrating tablet 4mg  ODT q4 hours prn nausea/vomit 4 tablet 0  . phentermine (ADIPEX-P) 37.5 MG tablet Take 1 tablet by mouth daily with breakfast.  3  .  polyvinyl alcohol (LIQUIFILM TEARS) 1.4 % ophthalmic solution Place 1 drop into both eyes 3 (three) times daily as needed for dry eyes.    . potassium chloride SA (K-DUR,KLOR-CON) 20 MEQ tablet Take 20 mEq by mouth 2 (two) times daily.    . pravastatin (PRAVACHOL) 20 MG tablet Take 20 mg by mouth daily.    . promethazine (PHENERGAN) 12.5 MG tablet Take 12.5 mg by mouth every 4 (four) hours as needed for nausea.    Marland Kitchen sulfamethoxazole-trimethoprim (BACTRIM DS,SEPTRA DS) 800-160 MG tablet Take 1 tablet by mouth 2 (two) times daily. Started 01/05 for 10 days    . zolpidem (AMBIEN) 5 MG tablet Take 1 tablet (5 mg total) by mouth at bedtime as needed for sleep. (Patient not taking: Reported on 12/19/2015) 30 tablet 0     Discharge Medications: Please see discharge summary for a list of discharge medications.  Relevant Imaging Results:  Relevant Lab Results:   Additional Information    Terrall Bley R, LCSW

## 2015-12-26 NOTE — ED Notes (Addendum)
Pt is asleep on her right side. She remains with a Actuary. Pt has regular respirations and is not in any distress. She does appear comfortable at this time.Pt states she had a good night sleep. She asked not to wash up today but she did wash her face and brush her teeth. Pt ate 100% of her breakfast and appears in good spirits. Niece stated yesterday that the family was looking to transfer the pt to [facility]. Pt inquired and asked the nurse what the house number was of where she was right now. Oriented pt and informed her she was in the hospital now in room 31. 1:50pm Representative from [facility] in with the patient.

## 2015-12-26 NOTE — ED Notes (Signed)
Pt is without sitter for remainder of shift. Both AC and charge nurse are aware. Pt room is located in front of nurses station. Pt is asleep, breathing is WNL. Will continue to monitor patient.

## 2015-12-26 NOTE — ED Provider Notes (Signed)
Patient is set up by social work to go to a living facility. Patient was previously involuntarily committed by another physician due to not having medical decision capacity and trying to leave the emergency department during workup. At this point family is requesting reversal of this as she is going to a facility. IVC paperwork will be reversed and patient will be discharged.  Sherwood Gambler, MD 12/26/15 [phone]

## 2015-12-26 NOTE — Progress Notes (Signed)
CSW received telephone call from the [place], niece to provide the contact person to speak with at [facility]. Contact person is Bevelyn Ngo [phone] or [phone].  CSW spoke with Bevelyn Ngo regarding a time someone was coming to complete an assessment on patient. She stated she did not have a response time at the moment. She stated she will call CSW back once she is made aware of a time a nurse can come for a visit. She also noted she would review information to see if patient would be allowed to be taken at their facility.  Genice Rouge [id number] ED CSW 12/26/2015 9:08 AM

## 2015-12-26 NOTE — Progress Notes (Addendum)
FL2 document was completed on 12/24/15 by weekend CSW.   CSW obtained Signature from EDP for FL2 this afternoon. CSW will fax FL2 and tbs test reading to [place] of [facility].  Claudette Head of [facility] called CSW and states that patient has been accepted to their facility. She states that the family has been at the facilty, and are aware that the patient has been accepted. She states that family would like to transport the patient.  CSW made nurse aware. CSW made EDP aware. CSW will reach out to family.   T[place]a/ [phone]  Willette Brace [id number] ED CSW 12/26/2015 5:57 PM

## 2015-12-26 NOTE — Progress Notes (Addendum)
CSW met with Jonetta Speak, Associate Director with [facility], contact numbers:  [phone] or [phone]. CSW provided Asst. Director with Psychiatry note from 1/15, as well as the FL2. CSW also provided Asst. Director with names and contact numbers for the brother, Rennis Petty, ([place]) [phone] and the niece, Alison Murray [phone].   Asst. Director wanted to know: 1. How was patient's altered mental status being described and 2. If patient was placed, would psychiatry services need to be implemented.   CSW spoke with NP to obtain information. NP reports patient's altered mental status was medication induced and facilitated by patients use of opioids and klonopin and that the family felt she was missusing. NP also reports no psychiatry services would need to be implemented.   CSW called Jonetta Speak, Associate Director, [phone] to inform her of the information obtained from NP. She stated she would speak with her facility regarding all information received.  CSW received a telephone call from [facility]. She wanted to know how patient was given albuterol. CSW spoke with nurse and nurse reports patient's albuterol is given by inhaler. CSW informed Assoc. Director that albuterol given by inhaler.  CSW received a telephone call from [facility], [place], [place], [place] [phone] regarding patient. CSW informed Mr. Nori Riis that patient had not been discharged at this time. CSW informed Mr. Nori Riis to contact the family to see if they would like to pursue their facility. He stated if the family chose to pursue, someone would come and assess patient on tomorrow.   Genice Rouge [phone] ED CSW 12/26/2015 2:56 PM

## 2015-12-26 NOTE — Progress Notes (Addendum)
10:50a- CSW received a telephone call from Molli Barrows, [place] at [facility], [place]. He reports he received patient's information and he would call the family to see if they would be interested in their facility.   12:31p- CSW received telephone call from Bevelyn Ngo, [place] stating that a nurse would be coming to [facility] from their facility around 1:30p for an assessment of patient.   Genice Rouge [id number] ED CSW 12/26/2015 12:44 PM

## 2016-02-29 DIAGNOSIS — F5101 Primary insomnia: Secondary | ICD-10-CM | POA: Insufficient documentation

## 2016-03-14 DIAGNOSIS — R110 Nausea: Secondary | ICD-10-CM | POA: Insufficient documentation

## 2016-03-14 DIAGNOSIS — K219 Gastro-esophageal reflux disease without esophagitis: Secondary | ICD-10-CM | POA: Insufficient documentation

## 2016-03-14 DIAGNOSIS — F4322 Adjustment disorder with anxiety: Secondary | ICD-10-CM | POA: Insufficient documentation

## 2016-03-15 DIAGNOSIS — M1990 Unspecified osteoarthritis, unspecified site: Secondary | ICD-10-CM | POA: Insufficient documentation

## 2016-03-15 DIAGNOSIS — M810 Age-related osteoporosis without current pathological fracture: Secondary | ICD-10-CM | POA: Insufficient documentation

## 2016-03-15 DIAGNOSIS — I6782 Cerebral ischemia: Secondary | ICD-10-CM | POA: Insufficient documentation

## 2016-03-15 DIAGNOSIS — D75839 Thrombocytosis, unspecified: Secondary | ICD-10-CM | POA: Insufficient documentation

## 2016-03-15 DIAGNOSIS — H3530 Unspecified macular degeneration: Secondary | ICD-10-CM | POA: Insufficient documentation

## 2016-03-15 DIAGNOSIS — F419 Anxiety disorder, unspecified: Secondary | ICD-10-CM | POA: Insufficient documentation

## 2016-03-15 DIAGNOSIS — D473 Essential (hemorrhagic) thrombocythemia: Secondary | ICD-10-CM | POA: Insufficient documentation

## 2016-03-15 DIAGNOSIS — IMO0002 Reserved for concepts with insufficient information to code with codable children: Secondary | ICD-10-CM | POA: Insufficient documentation

## 2016-03-15 DIAGNOSIS — E559 Vitamin D deficiency, unspecified: Secondary | ICD-10-CM | POA: Insufficient documentation

## 2016-03-22 DIAGNOSIS — I951 Orthostatic hypotension: Secondary | ICD-10-CM | POA: Insufficient documentation

## 2016-03-28 DIAGNOSIS — R61 Generalized hyperhidrosis: Secondary | ICD-10-CM | POA: Insufficient documentation

## 2016-04-24 ENCOUNTER — Encounter: Payer: Self-pay | Admitting: Gastroenterology

## 2016-05-07 DIAGNOSIS — L309 Dermatitis, unspecified: Secondary | ICD-10-CM | POA: Insufficient documentation

## 2016-05-21 DIAGNOSIS — R1032 Left lower quadrant pain: Secondary | ICD-10-CM | POA: Insufficient documentation

## 2016-07-18 DIAGNOSIS — K5732 Diverticulitis of large intestine without perforation or abscess without bleeding: Secondary | ICD-10-CM | POA: Insufficient documentation

## 2016-12-10 IMAGING — CR DG ELBOW COMPLETE 3+V*R*
4 series · 4 of 4 positions shown · non-contrast
Comparison: April 02, 2015

CLINICAL DATA: Pain following fall

EXAM:
RIGHT ELBOW - COMPLETE 3+ VIEW

[x elbow ap right]
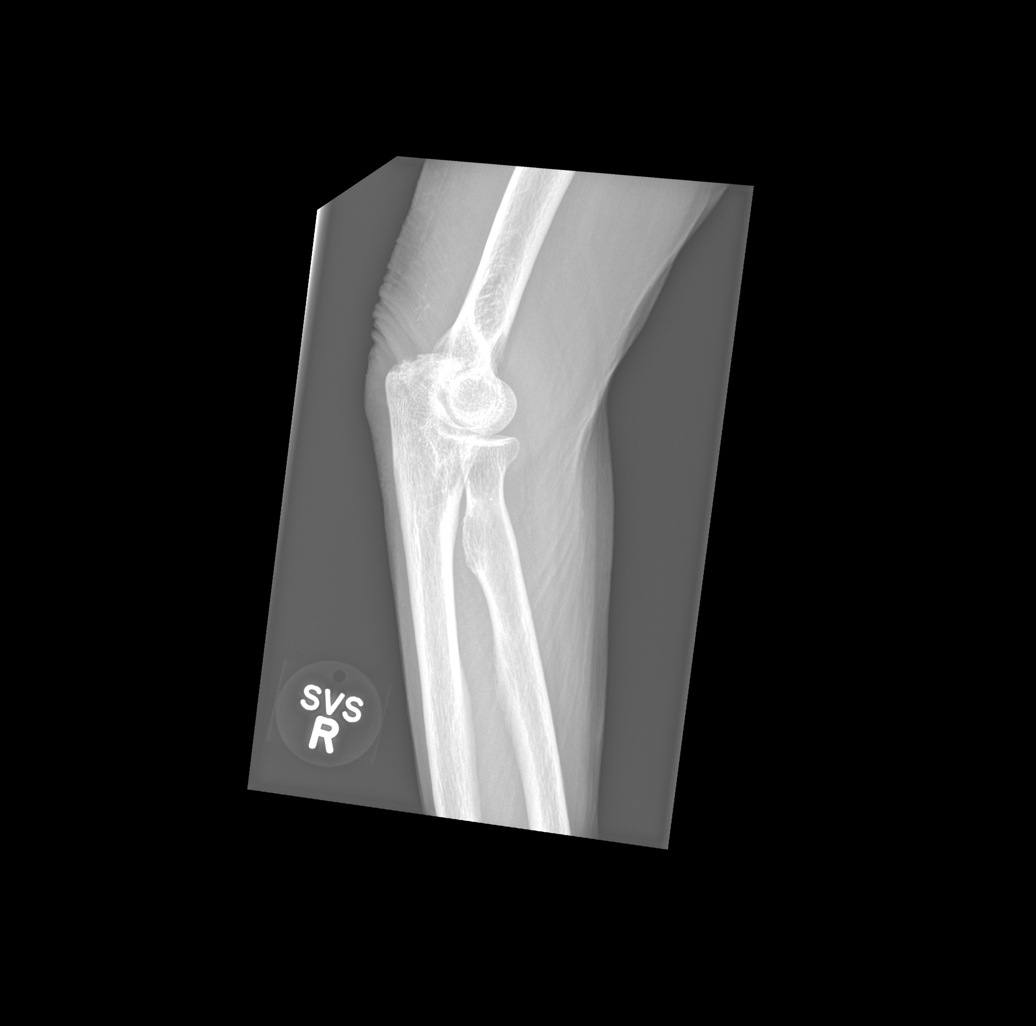

[x elbow obl right (1 of 2)]
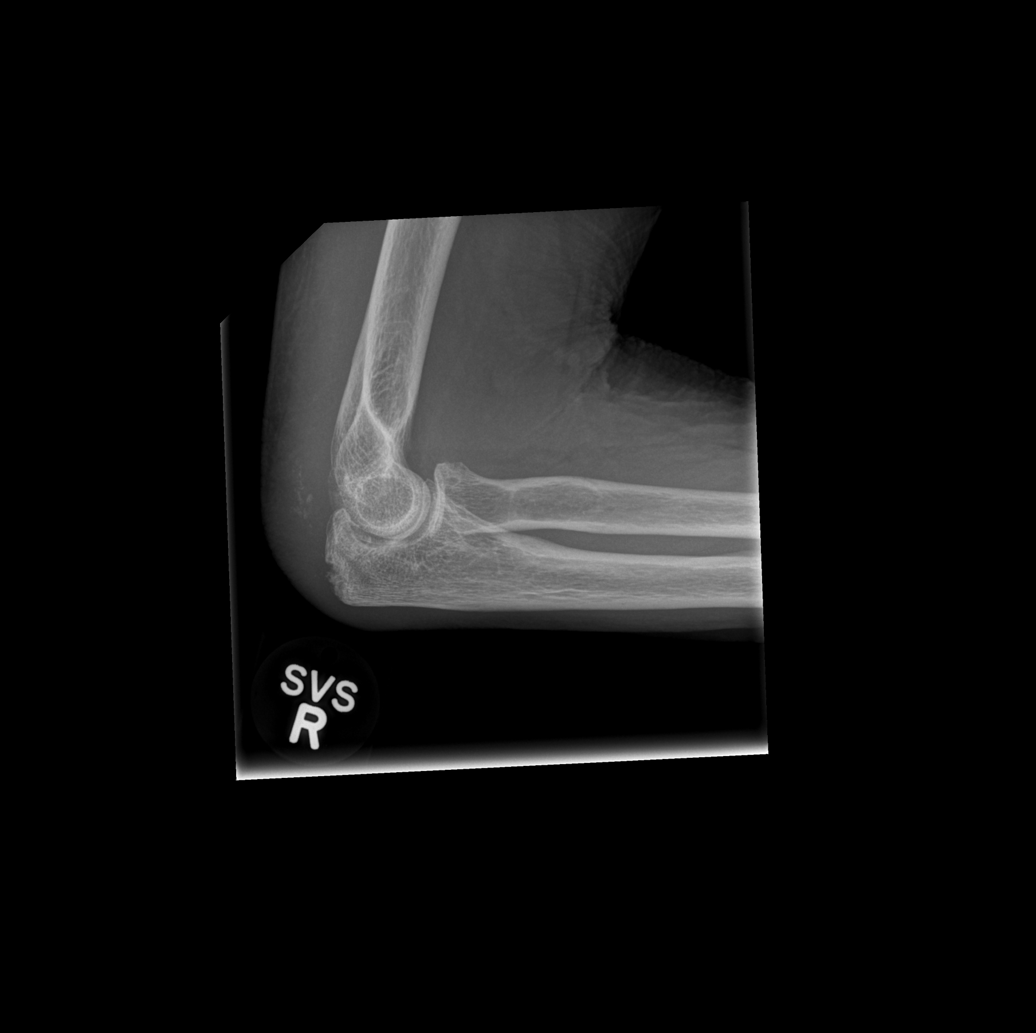

[x elbow obl right (2 of 2)]
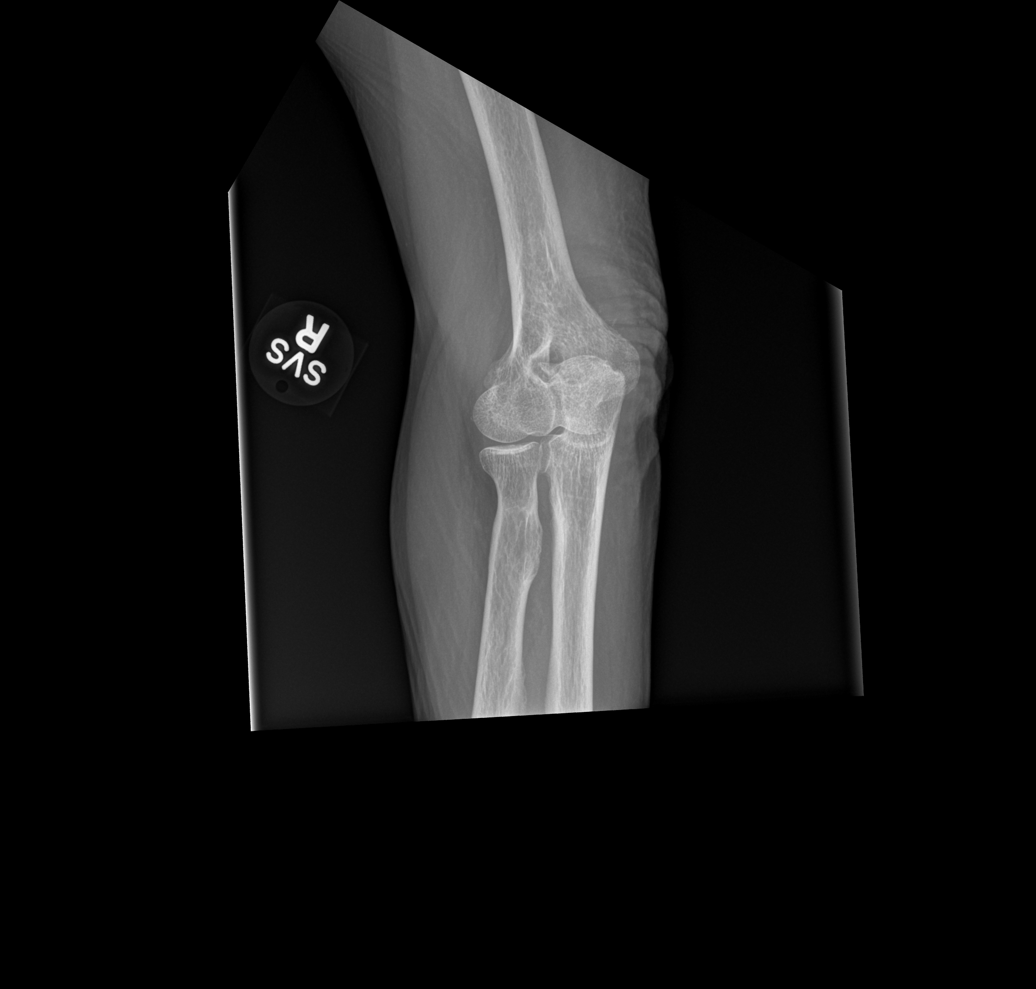

[x elbow lat right]
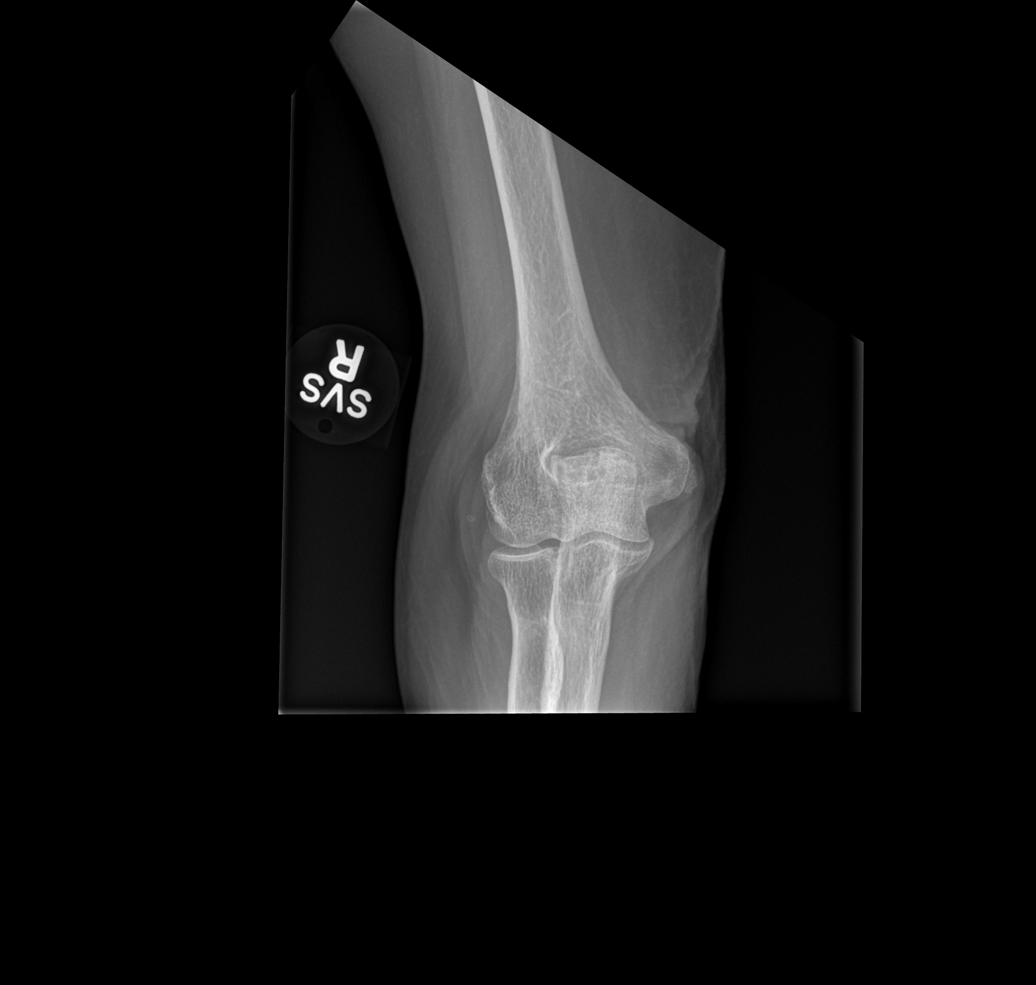

[4 of 4 positions shown; findings below may reference images not displayed]

FINDINGS: Frontal, lateral, and bilateral oblique views were obtained. There
is cortical irregularity along the olecranon process of the proximal
ulna with several small calcifications in the region of the distal
triceps tendon. There is mild soft tissue swelling in this area. The
calcifications appear slightly more proximal compared to the
previous study, raising question of potential distal triceps tendon
tear or even avulsion. No acute fracture or dislocation is seen. No
joint effusion. There is no appreciable joint space narrowing. No
erosive change.
IMPRESSION: Prior trauma along the region of the olecranon portion of the
proximal ulna. Several small calcifications appear slightly more
proximal compared to the recent prior study. Question distal triceps
tendon avulsion. No acute fracture or dislocation. No elbow joint
effusion.

## 2017-02-21 DIAGNOSIS — F4321 Adjustment disorder with depressed mood: Secondary | ICD-10-CM | POA: Insufficient documentation

## 2017-02-21 DIAGNOSIS — F4320 Adjustment disorder, unspecified: Secondary | ICD-10-CM | POA: Insufficient documentation

## 2017-05-13 DIAGNOSIS — N183 Chronic kidney disease, stage 3 (moderate): Secondary | ICD-10-CM

## 2017-05-13 DIAGNOSIS — N1832 Chronic kidney disease, stage 3b: Secondary | ICD-10-CM | POA: Insufficient documentation

## 2017-06-16 DIAGNOSIS — R9431 Abnormal electrocardiogram [ECG] [EKG]: Secondary | ICD-10-CM | POA: Insufficient documentation

## 2017-06-19 DIAGNOSIS — J90 Pleural effusion, not elsewhere classified: Secondary | ICD-10-CM | POA: Insufficient documentation

## 2018-02-13 DIAGNOSIS — E538 Deficiency of other specified B group vitamins: Secondary | ICD-10-CM | POA: Insufficient documentation

## 2018-03-03 DIAGNOSIS — M5431 Sciatica, right side: Secondary | ICD-10-CM | POA: Insufficient documentation

## 2018-03-03 DIAGNOSIS — N3000 Acute cystitis without hematuria: Secondary | ICD-10-CM | POA: Insufficient documentation

## 2018-04-23 ENCOUNTER — Ambulatory Visit (INDEPENDENT_AMBULATORY_CARE_PROVIDER_SITE_OTHER): Payer: Medicare Other | Admitting: Physical Medicine and Rehabilitation

## 2018-04-23 ENCOUNTER — Encounter (INDEPENDENT_AMBULATORY_CARE_PROVIDER_SITE_OTHER): Payer: Self-pay | Admitting: Physical Medicine and Rehabilitation

## 2018-04-23 VITALS — BP 127/75 | HR 89 | Temp 98.3°F

## 2018-04-23 DIAGNOSIS — F1190 Opioid use, unspecified, uncomplicated: Secondary | ICD-10-CM | POA: Diagnosis not present

## 2018-04-23 DIAGNOSIS — M069 Rheumatoid arthritis, unspecified: Secondary | ICD-10-CM

## 2018-04-23 DIAGNOSIS — G894 Chronic pain syndrome: Secondary | ICD-10-CM | POA: Diagnosis not present

## 2018-04-23 DIAGNOSIS — M419 Scoliosis, unspecified: Secondary | ICD-10-CM

## 2018-04-23 DIAGNOSIS — M4316 Spondylolisthesis, lumbar region: Secondary | ICD-10-CM | POA: Diagnosis not present

## 2018-04-23 DIAGNOSIS — M5416 Radiculopathy, lumbar region: Secondary | ICD-10-CM | POA: Diagnosis not present

## 2018-04-23 NOTE — Progress Notes (Signed)
Numeric Pain Rating Scale and Functional Assessment Average Pain 7   In the last MONTH (on 0-10 scale) has pain interfered with the following?  1. General activity like being  able to carry out your everyday physical activities such as walking, climbing stairs, carrying groceries, or moving a chair?  Rating(7)   

## 2018-04-29 ENCOUNTER — Telehealth (INDEPENDENT_AMBULATORY_CARE_PROVIDER_SITE_OTHER): Payer: Self-pay | Admitting: *Deleted

## 2018-04-30 ENCOUNTER — Encounter (INDEPENDENT_AMBULATORY_CARE_PROVIDER_SITE_OTHER): Payer: Self-pay | Admitting: Physical Medicine and Rehabilitation

## 2018-04-30 ENCOUNTER — Telehealth (INDEPENDENT_AMBULATORY_CARE_PROVIDER_SITE_OTHER): Payer: Self-pay | Admitting: Physical Medicine and Rehabilitation

## 2018-04-30 NOTE — Telephone Encounter (Signed)
The dictation note is in there.  Please send to Dr. Lynden Oxford.  Obviously patient can have copy if she would like to send someone else.  I had agreed to send a copy to her physiatrist

## 2018-04-30 NOTE — Progress Notes (Signed)
Rhonda Davenport - 77 y.o. female MRN [id number]  Date of birth: [birth year]  Office Visit Note: Visit Date: 04/23/2018 PCP: Maylon Peppers, MD Referred by: Maylon Peppers, MD  Subjective: Chief Complaint  Patient presents with  . Lower Back - Pain, Tingling  . Right Leg - Pain, Tingling   HPI: Rhonda Davenport is a pleasant 77 year old female accompanied by her husband who provides a lot of the history.  The patient is actually fairly sedated and falls asleep a few times during our conversation but states that she was recently placed on a small amount of oxycodone following the procedure.  This is in addition to fentanyl patch that she normally takes for chronic pain.  Nonetheless we were able to talk to her at length today.  Basically, she is followed by her primary care physician who manages most of her medications and she is also followed by Dr. Lynden Oxford, a physiatry wrist in [facility] who I have not met but I have had a few patients back and forth that of seeing him.  Her husband reports that they came today seeking an opinion on her back and right hip and leg pain.  A friend of theirs received a radiofrequency ablation procedure by myself who is done really well after the procedure and suggested they come in and talk to me.  They did mention this to Dr. Arrie Eastern whom they stated said it probably would not be beneficial.  From a pain standpoint she has had chronic many years of generalized pain but more specifically pain in the lower back on the right side in the right buttocks that radiates into the right leg.  She reports a lot of tingling in the right leg.  She denies any left-sided complaints.  She reports this onset of pain has worsened over the last several months but is been present for many years.  She reports that standing and walking really makes the pain worse and she does get some relief laying down.  She rates her average pain is a 7 out of 10.  It does seem to interfere with her  activities of daily living.  She has been receiving an S1 transforaminal epidural steroid injection by Dr. Arrie Eastern about every 3 months.  She states that these do help.  She has not had any focal weakness but is felt weak.  She has had no other red flag complaints other than the fact that she is very complicated medically with history of heart failure on Plavix as well as rheumatoid arthritis on high-risk medication along with history of depression and generalized anxiety disorder.  She also has stage III chronic kidney disease.  She has had multiple joint orthopedic surgeries including left total knee replacement.  Again no prior lumbar surgery.  Her biggest complaint is the right radicular pain.   Review of Systems  Constitutional: Positive for malaise/fatigue. Negative for chills, fever and weight loss.  HENT: Negative for hearing loss and sinus pain.   Eyes: Negative for blurred vision, double vision and photophobia.  Respiratory: Negative for cough and shortness of breath.   Cardiovascular: Negative for chest pain, palpitations and leg swelling.  Gastrointestinal: Negative for abdominal pain, nausea and vomiting.  Genitourinary: Negative for flank pain.  Musculoskeletal: Positive for back pain and joint pain. Negative for myalgias.       Right radicular leg pain  Skin: Negative for itching and rash.  Neurological: Positive for tingling and weakness. Negative for tremors and  focal weakness.  Endo/Heme/Allergies: Negative.   Psychiatric/Behavioral: Negative for depression.  All other systems reviewed and are negative.  Otherwise per HPI.  Assessment & Plan: Visit Diagnoses:  1. Lumbar radiculopathy   2. Spondylolisthesis of lumbar region   3. Scoliosis of thoracolumbar spine, unspecified scoliosis type   4. Chronic pain syndrome   5. Rheumatoid arthritis involving multiple joints (HCC)   6. Uncomplicated opioid use     Plan: Findings:  Medically complicated chronic pain patient with  right L5 or S1 radicular leg pain which has been worsening but seems to be managed with S1 transforaminal epidural injection about every 3 months.  She reports the last injection performed a couple of weeks ago on 04/16/2018 felt different to her has not helped very much.  Otherwise she is maintained by her family physician on fentanyl patch which she does not feel like helps very well.  She is also very somnolent today from getting some pain medication for recent procedure.  Nonetheless they came in today hoping that radiofrequency ablation would be an answer to a lot of her pain.  Unfortunately as Dr. Arrie Eastern knew, radiofrequency ablation really would only be beneficial for facet mediated joint pain and mostly axial low back pain.  We discussed this at length with her and her husband.  She reports that Dr. Arrie Eastern will only do the injection every 3 months.  I did explain the effects of steroid medications and the point of fact that while there is no real book answer to the numbers of injections it is something we have to look at on a case-by-case basis.  I did tell her that if the injection did not help and she had been very consistent otherwise at least in my practice I would probably repeat one sooner than 3 months at least on a very infrequent occasion if it just did not seem to help.  This would not be something we would do frequently.  Otherwise from a medication standpoint it appears that she may be taking some Cymbalta but her medication list is extensive.  She has not tried Lyrica but she is on a very small amount of gabapentin.  She does have some kidney dysfunction.  One avenue they could look at would be increasing the gabapentin to some degree.  Obviously would not want to increase her somnolence.  She has had a history of drug-induced confusion at different times.  She really does have polypharmacy and months many conditions.  Lastly one could consider spinal cord stimulator trial.  She would be a  fairly complicated case with heart failure and chronic anticoagulation.  Dr. Arrie Eastern may have consider this already.  I was happy to see the patient today for an opinion on radiofrequency ablation and we spent over 45 minutes of face-to-face time counseling on radiofrequency ablation and radiculopathy as well as medication management.  She will continue to follow with Dr. Arrie Eastern and she is going to ask him about possibly repeating the last injection in a little sooner than 3 months, because she has been fairly consistently getting better on the injections  Consistent with good response of the S1 transforaminal injection.    Meds & Orders: No orders of the defined types were placed in this encounter.  No orders of the defined types were placed in this encounter.   Follow-up: Return for Continue to follow with Dr. Lynden Oxford.   Procedures: No procedures performed  No notes on file   Clinical History: LUMBAR SPINE -  COMPLETE 4+ VIEW  COMPARISON: Lumbar radiograph report 12/04/ 2000  FINDINGS: There is severe loss of disc space height at T11-T12 with endplate sclerosis. Approximate 10% loss of vertebral body height at these levels additionally.  There is endplate sclerosis at disc narrowing narrowing L1-L2.  There is severe joint space narrowing at L4-L5 with grade 1 anterolisthesis of L4 on L5.  IMPRESSION: 1. Mild loss vertebral body height and disc height at T11-T12 which is age indeterminate. The amount of sclerosis suggests chronic findings. 2. Chronic disc space narrowing and sclerosis with anterolisthesis of L4 on L5.   Electronically Signed By: Suzy Bouchard M.D. On: 12/24/2015 11:54  Lumbar spine MRI dated 03/09/2014 This shows thoracolumbar scoliosis with some rotatory component.  There is severe left foraminal stenosis at T12.  There is multilevel degenerative disc changes with disc height loss.  There is stairstep listhesis of L4 on 5 and L5 on S1.  There  is bilateral stenosis of the foramen at L4 but no central canal stenosis.  There is facet arthropathy at this level.  At L5-S1 there is facet arthropathy bilaterally with moderate central stenosis and moderate lateral recess stenosis.  She has more left-sided foraminal narrowing at L5 but moderate.   She reports that she quit smoking about 35 years ago. Her smoking use included cigarettes. She has a 26.00 pack-year smoking history. She has never used smokeless tobacco. No results for input(s): HGBA1C, LABURIC in the last 8760 hours.  Objective:  VS:  HT:    WT:   BMI:     BP:127/75  HR:89bpm  TEMP:98.3 F (36.8 C)(Oral)  RESP:100 % Physical Exam  Constitutional: She is oriented to person, place, and time. She appears well-developed. No distress.  Thin frail appearing and somewhat somnolent during the interview  HENT:  Head: Normocephalic and atraumatic.  Nose: Nose normal.  Mouth/Throat: Oropharynx is clear and moist.  Eyes: Conjunctivae and EOM are normal. No scleral icterus.  Wears glasses  Neck: Neck supple. No JVD present. No tracheal deviation present.  Cardiovascular: Regular rhythm and intact distal pulses.  Pulmonary/Chest: Effort normal. No respiratory distress.  Abdominal: Soft. She exhibits no distension. There is no rebound and no guarding.  Musculoskeletal: She exhibits no edema.  Patient is slow to arise from a seated position.  She does stand with a forward flexed lumbar spine with observed thoracolumbar scoliosis.  She has concordant back pain with extension and facet joint loading.  She has no pain over the greater trochanter but is somewhat tender over the right compared to left.  She has no pain over the PSIS.  She has good movement of both hips.  She has good distal strength without clonus.  She has a negative slump test bilaterally but tight hamstrings right more than left.  Neurological: She is alert and oriented to person, place, and time. A sensory deficit is  present. She exhibits normal muscle tone. Coordination normal.  Skin: Skin is warm. No rash noted. No erythema.  Psychiatric: Judgment normal.  Patient somnolent during the interview but mood and behavior normal.  Nursing note and vitals reviewed.   Ortho Exam Imaging: No results found.  Past Medical/Family/Surgical/Social History: Medications & Allergies reviewed per EMR, new medications updated. Patient Active Problem List   Diagnosis Date Noted  . Acute cystitis without hematuria 03/03/2018  . Sciatica of right side 03/03/2018  . Vitamin B12 deficiency 02/13/2018  . Pleural effusion, left 06/19/2017  . Abnormal EKG 06/16/2017  . Chronic kidney disease (CKD)  stage G3b/A3, moderately decreased glomerular filtration rate (GFR) between 30-44 mL/min/1.73 square meter and albuminuria creatinine ratio greater than 300 mg/g (HCC) 05/13/2017  . Grief reaction 02/21/2017  . Diverticulitis of large intestine without perforation or abscess without bleeding 07/18/2016  . LLQ abdominal pain 05/21/2016  . Eczema 05/07/2016  . Diaphoresis 03/28/2016  . Orthostatic hypotension 03/22/2016  . Anxiety 03/15/2016  . Chronic cerebral ischemia 03/15/2016  . Cystocele 03/15/2016  . Osteoarthritis 03/15/2016  . Macular degeneration 03/15/2016  . Senile osteoporosis 03/15/2016  . Thrombocytosis ([place]) 03/15/2016  . Vitamin D deficiency 03/15/2016  . Adjustment disorder with anxious mood 03/14/2016  . Gastroesophageal reflux disease without esophagitis 03/14/2016  . Nausea 03/14/2016  . Primary insomnia 02/29/2016  . Drug-induced confusion, acute ([place]) 12/24/2015  . Overuse of medication 12/24/2015  . 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 Contusion   . Acute renal failure (ARF) ([place]) 12/19/2015  . Altered mental status 12/19/2015  . Lethargy 12/15/2015  . Ataxia 10/27/2015  . 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 Multiple falls 10/27/2015  . Intrinsic  asthma without complication 24/40/1027  . Major depressive disorder, single episode 10/06/2015  . 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 Iron deficiency anemia 06/22/2015  . Chronic systolic heart failure ([place]) 06/21/2015  . Localized edema 06/21/2015  . Essential hypertension 05/30/2015  . Malaise and fatigue 05/30/2015  . Lumbar spondylolysis 02/07/2015  . 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 Leg weakness, bilateral 01/05/2015  . 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 Lumbar degenerative disc disease 06/07/2014  . Lumbar radiculopathy 06/07/2014  . Elevated blood pressure 05/06/2014  . Rheumatoid arthritis involving multiple joints ([place]) 05/06/2014  . Seizure ([place]) 03/24/2014  . Altered mental state 03/24/2014  . Generalized anxiety disorder 02/15/2014  . Hypothyroidism 05/16/2013  . Hyperlipidemia 05/16/2013  . Cough 12/25/2012   Past Medical History:  Diagnosis Date  . Anemia   . Chronic low back pain   . Fall   . 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 GERD (gastroesophageal reflux disease)   . H/O blood clots    pt states had blood clot in her rt arm  . High cholesterol   . Hypertension   . 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 Thyroid disease    Hypothyroidism   Family History  Problem Relation Age of Onset  . Rheum arthritis Mother   . Rheum arthritis Father   . Healthy Brother    Past Surgical History:  Procedure Laterality Date  . ABDOMINAL HYSTERECTOMY    . APPENDECTOMY    . CARPAL TUNNEL RELEASE Bilateral   . JOINT REPLACEMENT     TKR, left  . SHOULDER SURGERY     Bilateral  . TONSILLECTOMY     Social History   Occupational History  . Occupation: Retired    Comment: Press photographer  Tobacco Use  . Smoking status: Former Smoker    Packs/day: 1.00    Years: 26.00    Pack years: 26.00    Types: Cigarettes    Last attempt to quit: 12/10/1982    Years since quitting: 35.4  . Smokeless tobacco: Never Used  Substance and Sexual Activity  . Alcohol use: No  . Drug  use: No  . Sexual activity: Not on file

## 2018-04-30 NOTE — Telephone Encounter (Signed)
See message below °

## 2018-04-30 NOTE — Telephone Encounter (Signed)
04/23/2018 OV note faxed Dr Arrie Eastern [phone]

## 2019-02-23 DIAGNOSIS — Z79899 Other long term (current) drug therapy: Secondary | ICD-10-CM | POA: Insufficient documentation

## 2019-11-16 DIAGNOSIS — Z87440 Personal history of urinary (tract) infections: Secondary | ICD-10-CM | POA: Insufficient documentation

## 2019-12-21 ENCOUNTER — Other Ambulatory Visit: Payer: Self-pay | Admitting: Orthopedic Surgery

## 2019-12-21 DIAGNOSIS — M79602 Pain in left arm: Secondary | ICD-10-CM

## 2019-12-24 ENCOUNTER — Inpatient Hospital Stay: Admission: RE | Admit: 2019-12-24 | Payer: Medicare Other | Source: Ambulatory Visit

## 2019-12-30 ENCOUNTER — Ambulatory Visit
Admission: RE | Admit: 2019-12-30 | Discharge: 2019-12-30 | Disposition: A | Discharge status: Home / Self Care | Payer: Medicare Other | Source: Ambulatory Visit | Attending: Orthopedic Surgery | Admitting: Orthopedic Surgery

## 2019-12-30 DIAGNOSIS — M79602 Pain in left arm: Secondary | ICD-10-CM

## 2020-01-06 ENCOUNTER — Other Ambulatory Visit: Payer: Medicare Other

## 2020-07-14 ENCOUNTER — Ambulatory Visit: Payer: Medicare Other | Admitting: Medical

## 2021-02-27 DIAGNOSIS — G894 Chronic pain syndrome: Secondary | ICD-10-CM | POA: Insufficient documentation

## 2021-08-22 IMAGING — CT CT HUMERUS*L* W/O CM
3 series · 9 of 34 positions shown, 10 images · non-contrast
Comparison: Left shoulder x-rays dated June 08, 2019. CT left
shoulder dated April 06, 2011.

CLINICAL DATA: Chronic left upper arm pain. Prior left shoulder
arthroplasty complicated by periprosthetic fracture requiring ORIF.

EXAM:
CT OF THE UPPER LEFT EXTREMITY WITHOUT CONTRAST
TECHNIQUE: Multidetector CT imaging of the left humerus was performed according
to the standard protocol.

[Series 3: humerus 2.00 br40 s3 axial soft (person_name) · axial · 0.34mm/px · z∈[-833,-833]mm · 1 of 165 slices shown, 2 images]
[im 89/165  soft-tissue]
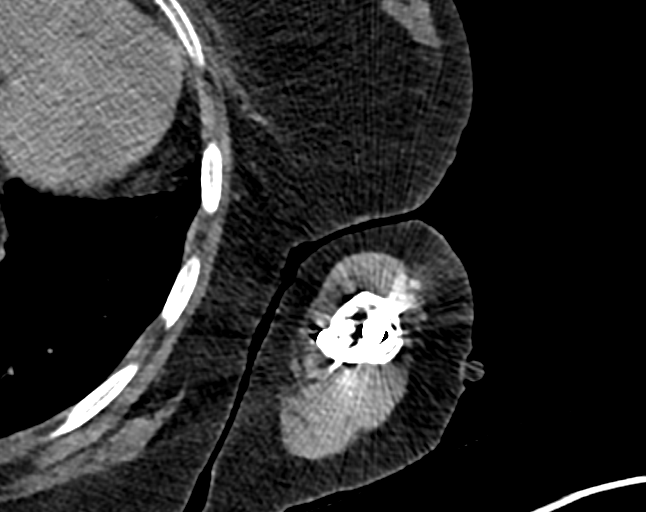
[im 89/165  bone]
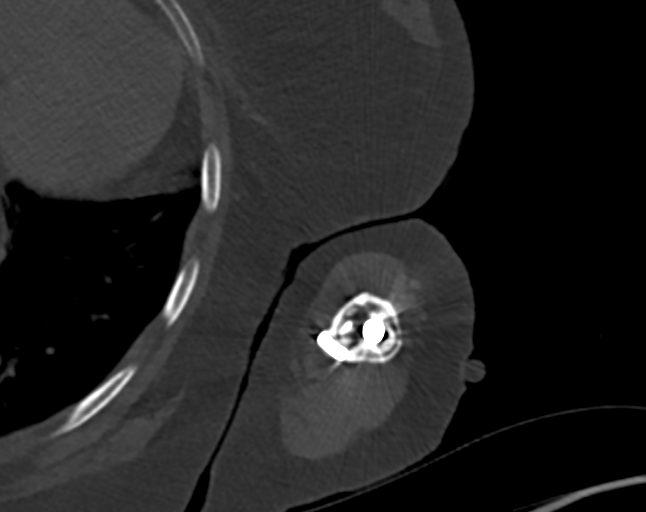

[Series 7: humerus 2.00 br40 s3 cor soft (person_name) · coronal · 0.41mm/px · 3 of 84 slices shown]
[im 17/84  bone]
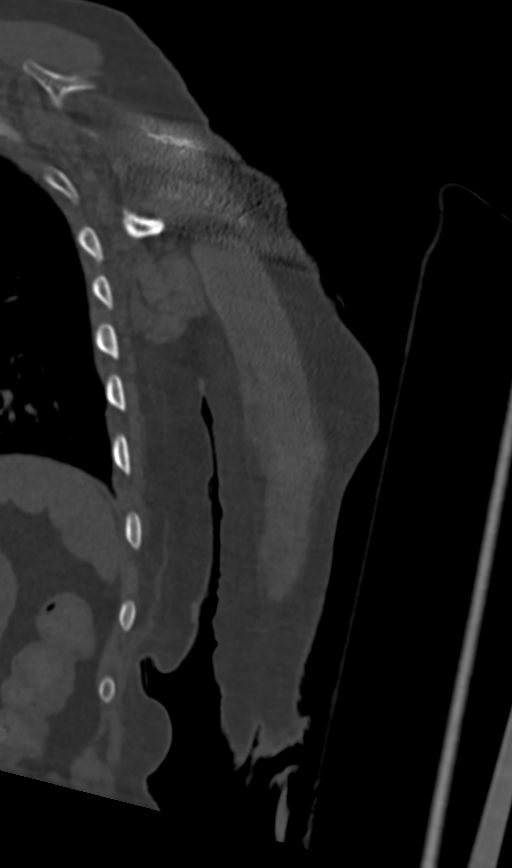
[im 34/84  bone]
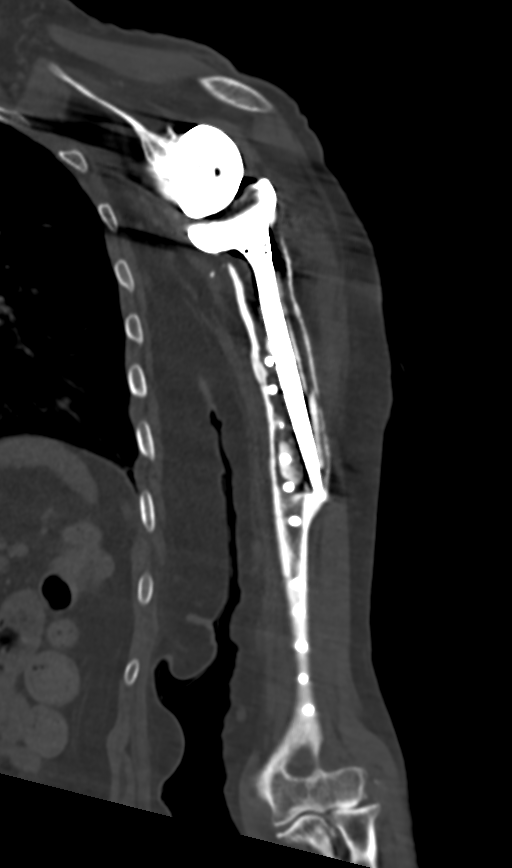
[im 50/84  bone]
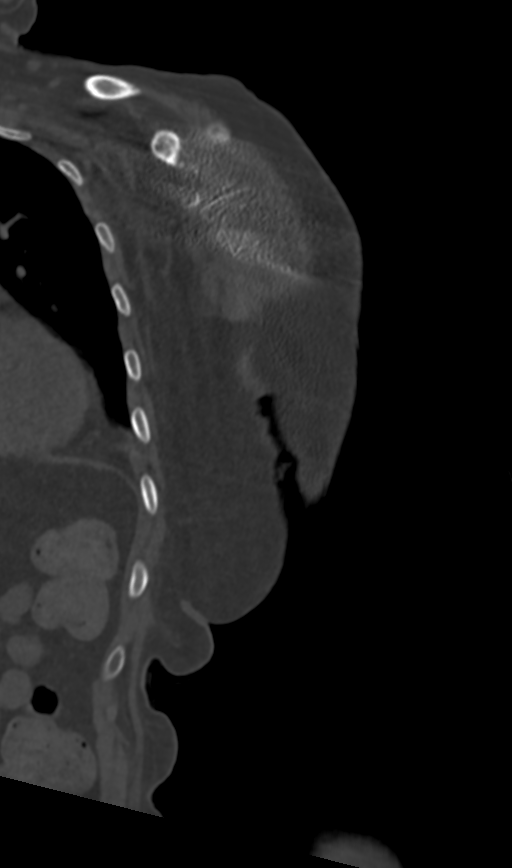

[Series 11: humerus 2.00 br40 s3 sag soft (person_name) · sagittal · 0.33mm/px · 5 of 93 slices shown]
[im 31/93  bone]
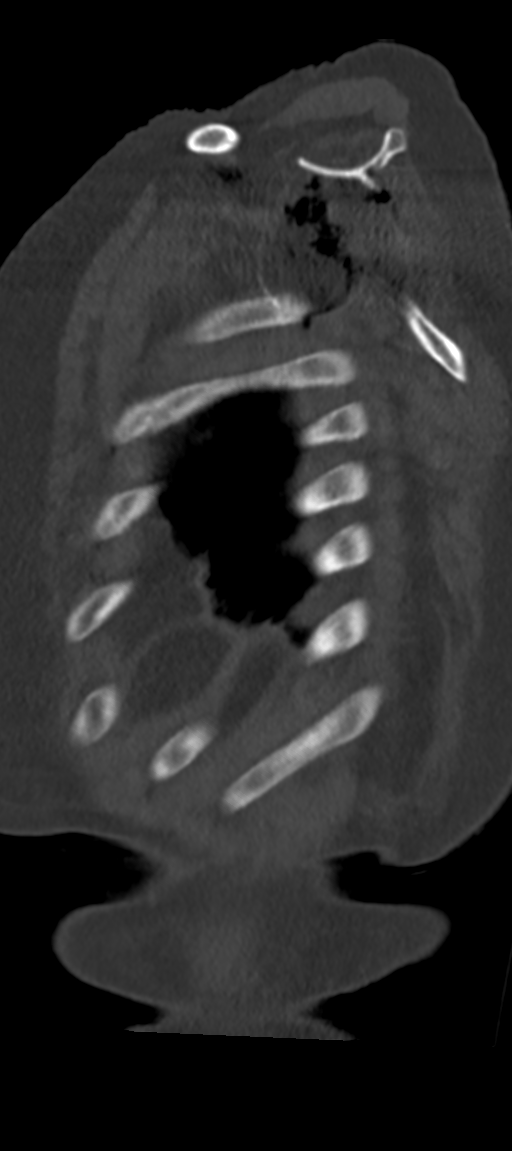
[im 39/93  bone]
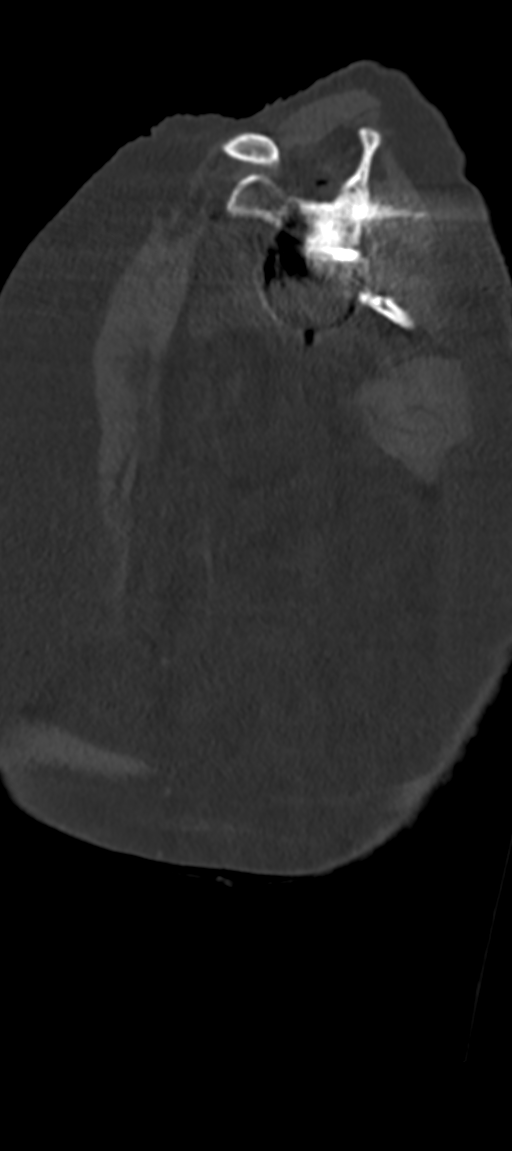
[im 47/93  bone]
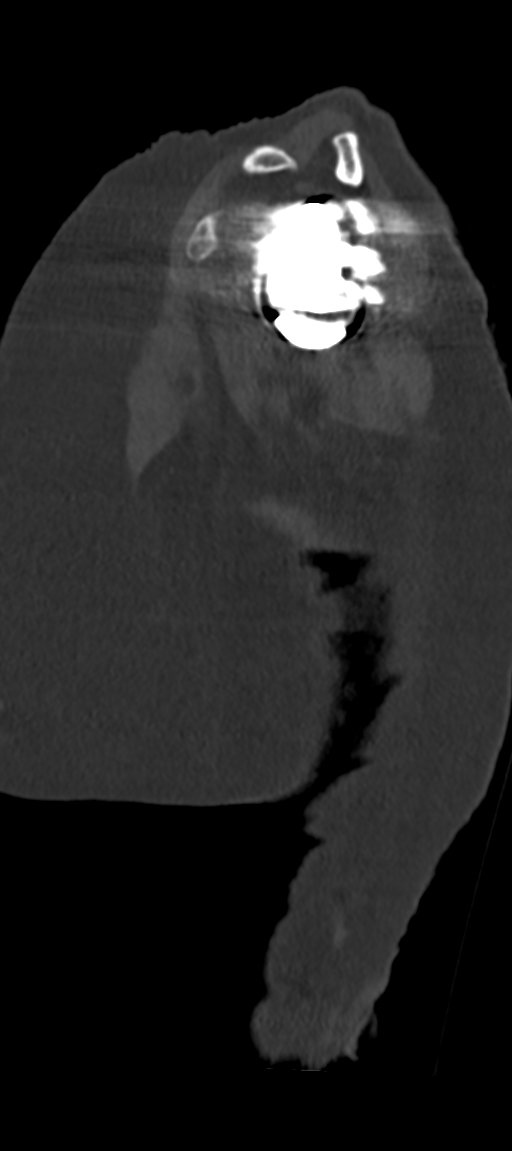
[im 54/93  bone]
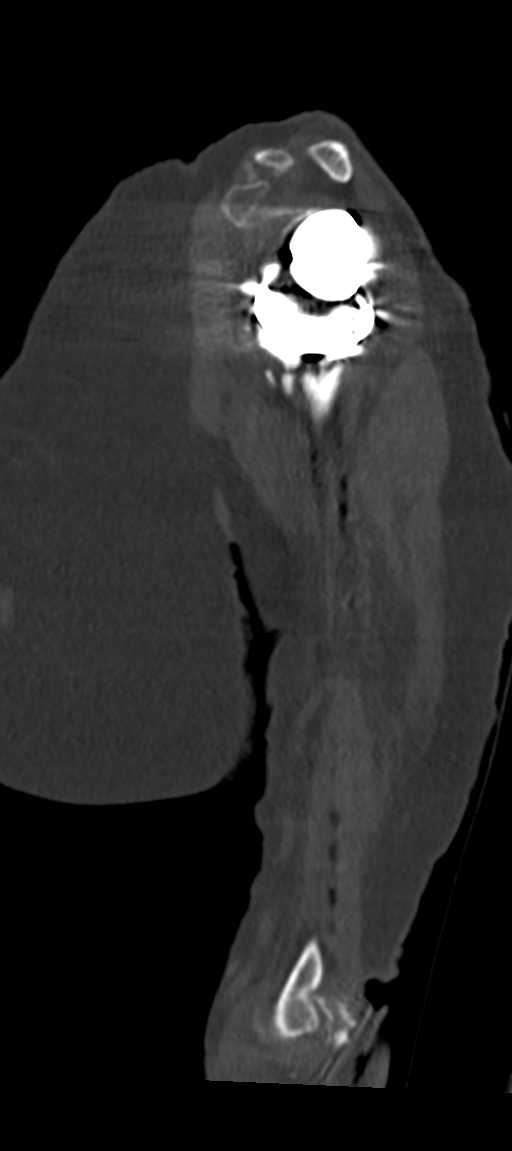
[im 62/93  bone]
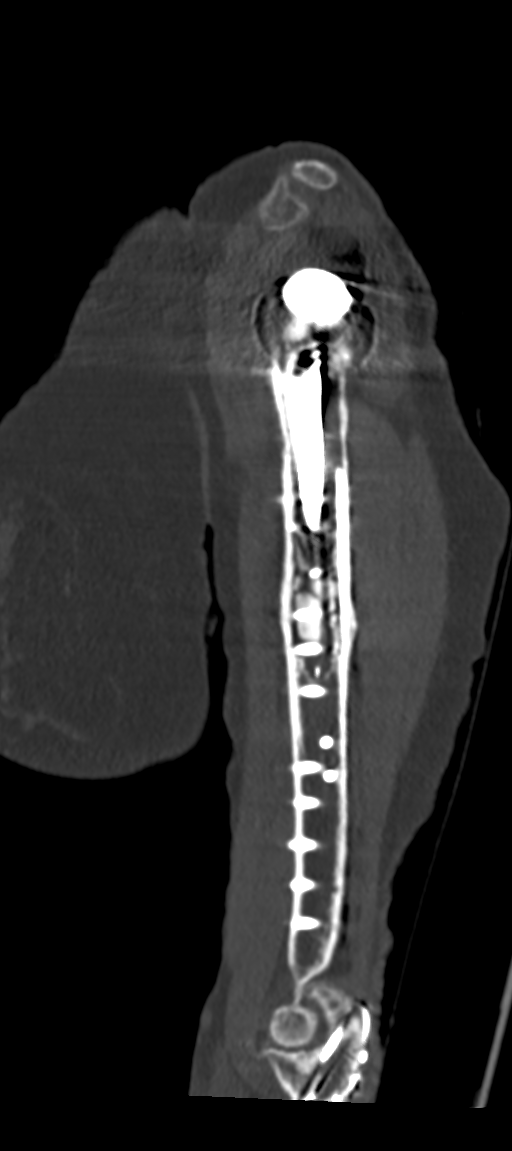

[9 of 34 positions shown; findings below may reference images not displayed]

FINDINGS: Bones/Joint/Cartilage

Prior left reverse total shoulder arthroplasty. Healed humeral
diaphyseal periprosthetic fracture deformity status post posterior
plate and screw fixation. Mild lucency surrounding the fifth most
superior screw. There are a few tiny focal cortical breaks involving
the lateral proximal to mid humeral diaphysis (series 9, images 51
and 52; series 5, image 67). Partially visualized proximal ulna ORIF
hardware. No joint effusion.

Unchanged appearance of the acromioclavicular joint with the distal
clavicle articulating with a remnant of the anterior acromion which
remains not continuous with the posterior aspect of the acromion.
Mild to moderate elbow osteoarthritis.

Ligaments

Ligaments are suboptimally evaluated by CT.

Muscles and Tendons
Grossly intact. Unchanged severe atrophy of the supraspinatus
muscle.

Soft tissue
No fluid collection or hematoma. No soft tissue mass.
IMPRESSION: 1. Prior left reverse total shoulder arthroplasty with healed
periprosthetic fracture deformity of the proximal to mid humeral
diaphysis status post ORIF. There are few tiny cortical breaks
involving the lateral proximal to mid humeral diaphysis, concerning
for recurrent nondisplaced periprosthetic fracture.

## 2022-05-01 ENCOUNTER — Other Ambulatory Visit: Payer: Self-pay | Admitting: Family Medicine

## 2022-05-01 DIAGNOSIS — R109 Unspecified abdominal pain: Secondary | ICD-10-CM

## 2022-06-15 ENCOUNTER — Other Ambulatory Visit: Payer: Medicare Other

## 2022-08-29 ENCOUNTER — Other Ambulatory Visit: Payer: Self-pay | Admitting: Orthopaedic Surgery

## 2022-08-29 DIAGNOSIS — M5416 Radiculopathy, lumbar region: Secondary | ICD-10-CM

## 2022-08-29 DIAGNOSIS — M5414 Radiculopathy, thoracic region: Secondary | ICD-10-CM

## 2022-09-07 ENCOUNTER — Ambulatory Visit
Admission: RE | Admit: 2022-09-07 | Discharge: 2022-09-07 | Disposition: A | Discharge status: Home / Self Care | Payer: Medicare Other | Source: Ambulatory Visit | Attending: Orthopaedic Surgery | Admitting: Orthopaedic Surgery

## 2022-09-07 DIAGNOSIS — M5416 Radiculopathy, lumbar region: Secondary | ICD-10-CM

## 2022-09-07 DIAGNOSIS — M5414 Radiculopathy, thoracic region: Secondary | ICD-10-CM

## 2022-09-07 MED ORDER — DIAZEPAM 5 MG PO TABS
5.0000 mg | ORAL_TABLET | Freq: Once | ORAL | Status: AC
  Administered 2022-09-07: 5 mg via ORAL

## 2022-09-07 MED ORDER — ONDANSETRON HCL 4 MG/2ML IJ SOLN: 4.0000 mg | Freq: Once | INTRAMUSCULAR | Status: DC | PRN

## 2022-09-07 MED ORDER — MEPERIDINE HCL 50 MG/ML IJ SOLN: 50.0000 mg | Freq: Once | INTRAMUSCULAR | Status: DC | PRN

## 2022-09-07 MED ORDER — IOPAMIDOL (ISOVUE-M 300) INJECTION 61%
10.0000 mL | Freq: Once | INTRAMUSCULAR | Status: AC | PRN
  Administered 2022-09-07: 10 mL via INTRATHECAL

## 2022-09-07 NOTE — Discharge Instructions (Signed)

## 2022-11-19 ENCOUNTER — Other Ambulatory Visit: Payer: Self-pay | Admitting: Neurosurgery

## 2022-11-19 DIAGNOSIS — M4312 Spondylolisthesis, cervical region: Secondary | ICD-10-CM

## 2022-12-11 ENCOUNTER — Emergency Department (HOSPITAL_BASED_OUTPATIENT_CLINIC_OR_DEPARTMENT_OTHER): Payer: Medicare Other

## 2022-12-11 ENCOUNTER — Other Ambulatory Visit: Payer: Self-pay

## 2022-12-11 ENCOUNTER — Encounter (HOSPITAL_BASED_OUTPATIENT_CLINIC_OR_DEPARTMENT_OTHER): Payer: Self-pay

## 2022-12-11 ENCOUNTER — Emergency Department (HOSPITAL_BASED_OUTPATIENT_CLINIC_OR_DEPARTMENT_OTHER)
Admission: EM | Admit: 2022-12-11 | Discharge: 2022-12-11 | Disposition: A | Discharge status: Home / Self Care | Payer: Medicare Other | Attending: Emergency Medicine | Admitting: Emergency Medicine

## 2022-12-11 DIAGNOSIS — N189 Chronic kidney disease, unspecified: Secondary | ICD-10-CM | POA: Diagnosis not present

## 2022-12-11 DIAGNOSIS — K449 Diaphragmatic hernia without obstruction or gangrene: Secondary | ICD-10-CM | POA: Diagnosis not present

## 2022-12-11 DIAGNOSIS — R112 Nausea with vomiting, unspecified: Secondary | ICD-10-CM

## 2022-12-11 DIAGNOSIS — I129 Hypertensive chronic kidney disease with stage 1 through stage 4 chronic kidney disease, or unspecified chronic kidney disease: Secondary | ICD-10-CM | POA: Diagnosis not present

## 2022-12-11 DIAGNOSIS — E860 Dehydration: Secondary | ICD-10-CM | POA: Diagnosis not present

## 2022-12-11 DIAGNOSIS — Z1152 Encounter for screening for COVID-19: Secondary | ICD-10-CM | POA: Insufficient documentation

## 2022-12-11 DIAGNOSIS — E039 Hypothyroidism, unspecified: Secondary | ICD-10-CM | POA: Insufficient documentation

## 2022-12-11 LAB — COMPREHENSIVE METABOLIC PANEL
ALT: 8 U/L (ref 0–44)
AST: 18 U/L (ref 15–41)
Albumin: 4.5 g/dL (ref 3.5–5.0)
Alkaline Phosphatase: 49 U/L (ref 38–126)
Anion gap: 10 (ref 5–15)
BUN: 17 mg/dL (ref 8–23)
CO2: 26 mmol/L (ref 22–32)
Calcium: 11.3 mg/dL — ABNORMAL HIGH (ref 8.9–10.3)
Chloride: 101 mmol/L (ref 98–111)
Creatinine, Ser: 1.13 mg/dL — ABNORMAL HIGH (ref 0.44–1.00)
GFR, Estimated: 49 mL/min — ABNORMAL LOW (ref >60)
Glucose, Bld: 89 mg/dL (ref 70–99)
Potassium: 5.1 mmol/L (ref 3.5–5.1)
Sodium: 137 mmol/L (ref 135–145)
Total Bilirubin: 0.4 mg/dL (ref 0.3–1.2)
Total Protein: 8.1 g/dL (ref 6.5–8.1)

## 2022-12-11 LAB — CBC WITH DIFFERENTIAL/PLATELET
Abs Immature Granulocytes: 0.02 10*3/uL (ref 0.00–0.07)
Basophils Absolute: 0 10*3/uL (ref 0.0–0.1)
Basophils Relative: 0 %
Eosinophils Absolute: 0.2 10*3/uL (ref 0.0–0.5)
Eosinophils Relative: 2 %
HCT: 39.6 % (ref 36.0–46.0)
Hemoglobin: 12.8 g/dL (ref 12.0–15.0)
Immature Granulocytes: 0 %
Lymphocytes Relative: 31 %
Lymphs Abs: 2.3 10*3/uL (ref 0.7–4.0)
MCH: 27.9 pg (ref 26.0–34.0)
MCHC: 32.3 g/dL (ref 30.0–36.0)
MCV: 86.3 fL (ref 80.0–100.0)
Monocytes Absolute: 0.8 10*3/uL (ref 0.1–1.0)
Monocytes Relative: 11 %
Neutro Abs: 4.1 10*3/uL (ref 1.7–7.7)
Neutrophils Relative %: 56 %
Platelets: 319 10*3/uL (ref 150–400)
RBC: 4.59 MIL/uL (ref 3.87–5.11)
RDW: 13.4 % (ref 11.5–15.5)
WBC: 7.5 10*3/uL (ref 4.0–10.5)
nRBC: 0 % (ref 0.0–0.2)

## 2022-12-11 LAB — RESP PANEL BY RT-PCR (RSV, FLU A&B, COVID)  RVPGX2
Influenza A by PCR: NEGATIVE
Influenza B by PCR: NEGATIVE
Resp Syncytial Virus by PCR: NEGATIVE
SARS Coronavirus 2 by RT PCR: NEGATIVE

## 2022-12-11 LAB — LIPASE, BLOOD: Lipase: 37 U/L (ref 11–51)

## 2022-12-11 MED ORDER — IOHEXOL 300 MG/ML  SOLN
100.0000 mL | Freq: Once | INTRAMUSCULAR | Status: AC | PRN
  Administered 2022-12-11: 80 mL via INTRAVENOUS

## 2022-12-11 MED ORDER — ONDANSETRON HCL 4 MG/2ML IJ SOLN
4.0000 mg | Freq: Once | INTRAMUSCULAR | Status: AC
  Administered 2022-12-11: 4 mg via INTRAVENOUS
  Filled 2022-12-11: qty 2

## 2022-12-11 MED ORDER — PROMETHAZINE HCL 12.5 MG PO TABS: 12.5000 mg | ORAL_TABLET | Freq: Four times a day (QID) | ORAL | 0 refills | Status: DC | PRN

## 2022-12-11 MED ORDER — SODIUM CHLORIDE 0.9 % IV BOLUS
500.0000 mL | Freq: Once | INTRAVENOUS | Status: AC
  Administered 2022-12-11: 500 mL via INTRAVENOUS

## 2022-12-11 NOTE — ED Provider Notes (Signed)
Emergency Department Provider Note   I have reviewed the triage vital signs and the nursing notes.   HISTORY  Chief Complaint Vomiting   HPI Rhonda Davenport is a 82 y.o. female with past history of CKD  {**SYMPTOM/COMPLAINT  LOCATION (describe anatomically) DURATION (when did it start) TIMING (onset and pattern) SEVERITY (0-10, mild/moderate/severe) QUALITY (description of symptoms) CONTEXT (recent surgery, new meds, activity, etc.) MODIFYINGFACTORS (what makes it better/worse) ASSOCIATEDSYMPTOMS (pertinent positives and negatives)**}  Past Medical History:  Diagnosis Date   Anemia    Chronic low back pain    Fall    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   GERD (gastroesophageal reflux disease)    H/O blood clots    pt states had blood clot in her rt arm   High cholesterol    Hypertension    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 ENT: No sore throat. Cardiovascular: Denies chest pain. Respiratory: Denies shortness of breath. Gastrointestinal: No abdominal pain.  No nausea, no vomiting.  No diarrhea.  No constipation. Genitourinary: Negative for dysuria. Musculoskeletal: Negative for back pain. Skin: Negative for rash. Neurological: Negative for headaches, focal weakness or numbness. {**Psychiatric:  Endocrine:  Hematological/Lymphatic:  Allergic/Immunilogical: **}  ____________________________________________   PHYSICAL EXAM:  VITAL SIGNS: ED Triage Vitals  Enc Vitals Group     BP 12/11/22 1008 (!) 133/96     Pulse Rate 12/11/22 1008 (!) 104     Resp 12/11/22 1008 18     Temp 12/11/22 1008 98.2 F (36.8 C)     Temp Source 12/11/22 1008 Oral     SpO2 12/11/22 1008 99 %     Weight 12/11/22 1005 90 lb (40.8 kg)      Height 12/11/22 1005 '4\' 7"'$  (1.397 m)     Head Circumference --      Peak Flow --      Pain Score 12/11/22 1005 9     Pain Loc --      Pain Edu? --      Excl. in [place]? --    {** Revise as appropriate then delete this line - 8 systems required **} Constitutional: Alert and oriented. Well appearing and in no acute distress. Eyes: Conjunctivae are normal. PERRL. EOMI. Head: Atraumatic. {**Ears:  Healthy appearing ear canals and TMs bilaterally **}Nose: No congestion/rhinnorhea. Mouth/Throat: Mucous membranes are moist.  Oropharynx non-erythematous. Neck: No stridor.  No meningeal signs.  {**No cervical spine tenderness to palpation.**} Cardiovascular: Normal rate, regular rhythm. Good peripheral circulation. Grossly normal heart sounds.   Respiratory: Normal respiratory effort.  No retractions. Lungs CTAB. Gastrointestinal: Soft and nontender. No distention.  {**Genitourinary:  **}Musculoskeletal: No lower extremity tenderness nor edema. No gross deformities of extremities. Neurologic:  Normal speech and language. No gross focal neurologic deficits are appreciated.  Skin:  Skin is warm, dry and intact. No rash noted. {**Psychiatric: Mood and affect are normal. Speech and behavior are normal.**}  ____________________________________________   LABS (all labs ordered are listed, but only abnormal results are displayed)  Labs Reviewed  RESP PANEL BY RT-PCR (RSV, FLU A&B, COVID)  [id number]  COMPREHENSIVE METABOLIC PANEL  LIPASE, BLOOD  CBC WITH DIFFERENTIAL/PLATELET  URINALYSIS, ROUTINE W REFLEX MICROSCOPIC   ____________________________________________  EKG  *** ____________________________________________  RADIOLOGY  No results  found.  ____________________________________________   PROCEDURES  Procedure(s) performed:   Procedures   ____________________________________________   INITIAL IMPRESSION / ASSESSMENT AND PLAN / ED COURSE  Pertinent labs & imaging results  that were available during my care of the patient were reviewed by me and considered in my medical decision making (see chart for details).   This patient is Presenting for Evaluation of ***, which {Range:23949} require a range of treatment options, and {MDMcomplaint:23950} a complaint that involves a {MDMlevelrisk:23951} risk of morbidity and mortality.  The Differential Diagnoses include***.  Critical Interventions-    Medications  sodium chloride 0.9 % bolus 500 mL (has no administration in time range)  ondansetron (ZOFRAN) injection 4 mg (has no administration in time range)    Reassessment after intervention:     I *** Additional Historical Information from ***, as the patient is ***.  I decided to review pertinent External Data, and in summary ***.   Clinical Laboratory Tests Ordered, included   Radiologic Tests Ordered, included ***. I independently interpreted the images and agree with radiology interpretation.   Cardiac Monitor Tracing which shows ***   Social Determinants of Health Risk ***  Consult complete with  Medical Decision Making: Summary: ***  Reevaluation with update and discussion with   ***Considered admission***  Patient's presentation is most consistent with {EM COPA:27473}   Disposition:   ____________________________________________  FINAL CLINICAL IMPRESSION(S) / ED DIAGNOSES  Final diagnoses:  None     NEW OUTPATIENT MEDICATIONS STARTED DURING THIS VISIT:  New Prescriptions   No medications on file    Note:  This document was prepared using Dragon voice recognition software and may include unintentional dictation errors.  Nanda Quinton, MD, [facility] Emergency Medicine

## 2022-12-11 NOTE — ED Notes (Signed)
Pt states began before christmas has had N/V, unable to keep food down.

## 2022-12-11 NOTE — ED Triage Notes (Addendum)
C/o nausea/vomiting, states anything she puts in her mouth comes right back up. Denies abdominal pain/diarrhea. States last BM was over the weekend. C/o weakness, headache. Been losing weight the past 3 months.

## 2022-12-11 NOTE — Discharge Instructions (Signed)
You are seen in the emergency room today with vomiting.  I am refilling your Phenergan and will give you contact information for the gastroenterology doctor office in [place] if you are hoping to switch practices.  Your CT scan showed the hiatal hernia but no other new finding.

## 2023-02-01 ENCOUNTER — Emergency Department (HOSPITAL_BASED_OUTPATIENT_CLINIC_OR_DEPARTMENT_OTHER)
Admission: EM | Admit: 2023-02-01 | Discharge: 2023-02-01 | Discharge status: Against Medical Advice | Payer: Medicare Other | Attending: Emergency Medicine | Admitting: Emergency Medicine

## 2023-02-01 ENCOUNTER — Encounter (HOSPITAL_BASED_OUTPATIENT_CLINIC_OR_DEPARTMENT_OTHER): Payer: Self-pay | Admitting: Urology

## 2023-02-01 ENCOUNTER — Other Ambulatory Visit: Payer: Self-pay

## 2023-02-01 DIAGNOSIS — S81811A Laceration without foreign body, right lower leg, initial encounter: Secondary | ICD-10-CM | POA: Diagnosis present

## 2023-02-01 DIAGNOSIS — Z5321 Procedure and treatment not carried out due to patient leaving prior to being seen by health care provider: Secondary | ICD-10-CM | POA: Insufficient documentation

## 2023-02-01 DIAGNOSIS — W19XXXA Unspecified fall, initial encounter: Secondary | ICD-10-CM | POA: Insufficient documentation

## 2023-02-01 NOTE — ED Notes (Signed)
Called for x 2 with no answer

## 2023-02-01 NOTE — ED Triage Notes (Signed)
Pt states fall on Tuesday with skin tear to right lower leg Skin tear is large and open but bleeding controlled    Denies any head injury with fall, ON BLOOD THINNERS

## 2023-04-05 ENCOUNTER — Emergency Department (HOSPITAL_BASED_OUTPATIENT_CLINIC_OR_DEPARTMENT_OTHER): Payer: Medicare Other

## 2023-04-05 ENCOUNTER — Inpatient Hospital Stay (HOSPITAL_BASED_OUTPATIENT_CLINIC_OR_DEPARTMENT_OTHER)
Admission: EM | Admit: 2023-04-05 | Discharge: 2023-04-09 | DRG: 551 | Disposition: A | Discharge status: Skilled Nursing Facility | Payer: Medicare Other | Attending: Family Medicine | Admitting: Family Medicine

## 2023-04-05 ENCOUNTER — Encounter (HOSPITAL_BASED_OUTPATIENT_CLINIC_OR_DEPARTMENT_OTHER): Payer: Self-pay | Admitting: Urology

## 2023-04-05 DIAGNOSIS — M542 Cervicalgia: Secondary | ICD-10-CM

## 2023-04-05 DIAGNOSIS — S12001A Unspecified nondisplaced fracture of first cervical vertebra, initial encounter for closed fracture: Secondary | ICD-10-CM | POA: Diagnosis not present

## 2023-04-05 DIAGNOSIS — I10 Essential (primary) hypertension: Secondary | ICD-10-CM | POA: Diagnosis present

## 2023-04-05 DIAGNOSIS — L97819 Non-pressure chronic ulcer of other part of right lower leg with unspecified severity: Secondary | ICD-10-CM | POA: Diagnosis present

## 2023-04-05 DIAGNOSIS — F419 Anxiety disorder, unspecified: Secondary | ICD-10-CM | POA: Diagnosis present

## 2023-04-05 DIAGNOSIS — K219 Gastro-esophageal reflux disease without esophagitis: Secondary | ICD-10-CM | POA: Diagnosis present

## 2023-04-05 DIAGNOSIS — I471 Supraventricular tachycardia, unspecified: Secondary | ICD-10-CM | POA: Diagnosis present

## 2023-04-05 DIAGNOSIS — G451 Carotid artery syndrome (hemispheric): Secondary | ICD-10-CM | POA: Diagnosis present

## 2023-04-05 DIAGNOSIS — W19XXXA Unspecified fall, initial encounter: Secondary | ICD-10-CM | POA: Diagnosis present

## 2023-04-05 DIAGNOSIS — Z7902 Long term (current) use of antithrombotics/antiplatelets: Secondary | ICD-10-CM

## 2023-04-05 DIAGNOSIS — R1310 Dysphagia, unspecified: Secondary | ICD-10-CM | POA: Diagnosis present

## 2023-04-05 DIAGNOSIS — Z7982 Long term (current) use of aspirin: Secondary | ICD-10-CM

## 2023-04-05 DIAGNOSIS — I6782 Cerebral ischemia: Secondary | ICD-10-CM | POA: Diagnosis present

## 2023-04-05 DIAGNOSIS — Z90710 Acquired absence of both cervix and uterus: Secondary | ICD-10-CM

## 2023-04-05 DIAGNOSIS — Z87891 Personal history of nicotine dependence: Secondary | ICD-10-CM

## 2023-04-05 DIAGNOSIS — K449 Diaphragmatic hernia without obstruction or gangrene: Secondary | ICD-10-CM | POA: Diagnosis present

## 2023-04-05 DIAGNOSIS — D631 Anemia in chronic kidney disease: Secondary | ICD-10-CM | POA: Diagnosis present

## 2023-04-05 DIAGNOSIS — Z8673 Personal history of transient ischemic attack (TIA), and cerebral infarction without residual deficits: Secondary | ICD-10-CM

## 2023-04-05 DIAGNOSIS — Z885 Allergy status to narcotic agent status: Secondary | ICD-10-CM

## 2023-04-05 DIAGNOSIS — S12000A Unspecified displaced fracture of first cervical vertebra, initial encounter for closed fracture: Secondary | ICD-10-CM | POA: Diagnosis present

## 2023-04-05 DIAGNOSIS — E039 Hypothyroidism, unspecified: Secondary | ICD-10-CM | POA: Diagnosis present

## 2023-04-05 DIAGNOSIS — Z8261 Family history of arthritis: Secondary | ICD-10-CM

## 2023-04-05 DIAGNOSIS — I2510 Atherosclerotic heart disease of native coronary artery without angina pectoris: Secondary | ICD-10-CM | POA: Diagnosis present

## 2023-04-05 DIAGNOSIS — Z79890 Hormone replacement therapy (postmenopausal): Secondary | ICD-10-CM

## 2023-04-05 DIAGNOSIS — N1831 Chronic kidney disease, stage 3a: Secondary | ICD-10-CM | POA: Diagnosis present

## 2023-04-05 DIAGNOSIS — Q394 Esophageal web: Secondary | ICD-10-CM

## 2023-04-05 DIAGNOSIS — R296 Repeated falls: Secondary | ICD-10-CM

## 2023-04-05 DIAGNOSIS — I83018 Varicose veins of right lower extremity with ulcer other part of lower leg: Secondary | ICD-10-CM | POA: Diagnosis present

## 2023-04-05 DIAGNOSIS — Z6820 Body mass index (BMI) 20.0-20.9, adult: Secondary | ICD-10-CM

## 2023-04-05 DIAGNOSIS — R64 Cachexia: Secondary | ICD-10-CM | POA: Diagnosis present

## 2023-04-05 DIAGNOSIS — Z791 Long term (current) use of non-steroidal anti-inflammatories (NSAID): Secondary | ICD-10-CM

## 2023-04-05 DIAGNOSIS — J320 Chronic maxillary sinusitis: Secondary | ICD-10-CM | POA: Diagnosis present

## 2023-04-05 DIAGNOSIS — R112 Nausea with vomiting, unspecified: Secondary | ICD-10-CM | POA: Diagnosis present

## 2023-04-05 DIAGNOSIS — E7800 Pure hypercholesterolemia, unspecified: Secondary | ICD-10-CM | POA: Diagnosis present

## 2023-04-05 DIAGNOSIS — Z888 Allergy status to other drugs, medicaments and biological substances status: Secondary | ICD-10-CM

## 2023-04-05 DIAGNOSIS — Z96652 Presence of left artificial knee joint: Secondary | ICD-10-CM | POA: Diagnosis present

## 2023-04-05 DIAGNOSIS — Z79899 Other long term (current) drug therapy: Secondary | ICD-10-CM

## 2023-04-05 DIAGNOSIS — I5022 Chronic systolic (congestive) heart failure: Secondary | ICD-10-CM | POA: Diagnosis present

## 2023-04-05 DIAGNOSIS — M061 Adult-onset Still's disease: Secondary | ICD-10-CM | POA: Diagnosis present

## 2023-04-05 DIAGNOSIS — I130 Hypertensive heart and chronic kidney disease with heart failure and stage 1 through stage 4 chronic kidney disease, or unspecified chronic kidney disease: Secondary | ICD-10-CM | POA: Diagnosis present

## 2023-04-05 LAB — CBC WITH DIFFERENTIAL/PLATELET
Abs Immature Granulocytes: 0.02 10*3/uL (ref 0.00–0.07)
Basophils Absolute: 0 10*3/uL (ref 0.0–0.1)
Basophils Relative: 1 %
Eosinophils Absolute: 0.2 10*3/uL (ref 0.0–0.5)
Eosinophils Relative: 2 %
HCT: 35.2 % — ABNORMAL LOW (ref 36.0–46.0)
Hemoglobin: 11.5 g/dL — ABNORMAL LOW (ref 12.0–15.0)
Immature Granulocytes: 0 %
Lymphocytes Relative: 24 %
Lymphs Abs: 2.1 10*3/uL (ref 0.7–4.0)
MCH: 29.1 pg (ref 26.0–34.0)
MCHC: 32.7 g/dL (ref 30.0–36.0)
MCV: 89.1 fL (ref 80.0–100.0)
Monocytes Absolute: 1 10*3/uL (ref 0.1–1.0)
Monocytes Relative: 11 %
Neutro Abs: 5.3 10*3/uL (ref 1.7–7.7)
Neutrophils Relative %: 62 %
Platelets: 362 10*3/uL (ref 150–400)
RBC: 3.95 MIL/uL (ref 3.87–5.11)
RDW: 13.9 % (ref 11.5–15.5)
WBC: 8.6 10*3/uL (ref 4.0–10.5)
nRBC: 0 % (ref 0.0–0.2)

## 2023-04-05 LAB — COMPREHENSIVE METABOLIC PANEL
ALT: 9 U/L (ref 0–44)
AST: 19 U/L (ref 15–41)
Albumin: 3.8 g/dL (ref 3.5–5.0)
Alkaline Phosphatase: 53 U/L (ref 38–126)
Anion gap: 10 (ref 5–15)
BUN: 14 mg/dL (ref 8–23)
CO2: 21 mmol/L — ABNORMAL LOW (ref 22–32)
Calcium: 8.9 mg/dL (ref 8.9–10.3)
Chloride: 105 mmol/L (ref 98–111)
Creatinine, Ser: 0.87 mg/dL (ref 0.44–1.00)
GFR, Estimated: >60 mL/min (ref >60)
Glucose, Bld: 98 mg/dL (ref 70–99)
Potassium: 4.1 mmol/L (ref 3.5–5.1)
Sodium: 136 mmol/L (ref 135–145)
Total Bilirubin: 0.5 mg/dL (ref 0.3–1.2)
Total Protein: 7.3 g/dL (ref 6.5–8.1)

## 2023-04-05 MED ORDER — ONDANSETRON HCL 4 MG/2ML IJ SOLN
4.0000 mg | Freq: Once | INTRAMUSCULAR | Status: AC
  Administered 2023-04-05: 4 mg via INTRAVENOUS
  Filled 2023-04-05: qty 2

## 2023-04-05 MED ORDER — IOHEXOL 350 MG/ML SOLN
75.0000 mL | Freq: Once | INTRAVENOUS | Status: AC | PRN
  Administered 2023-04-05: 75 mL via INTRAVENOUS

## 2023-04-05 MED ORDER — FENTANYL CITRATE PF 50 MCG/ML IJ SOSY
25.0000 ug | PREFILLED_SYRINGE | Freq: Once | INTRAMUSCULAR | Status: AC
  Administered 2023-04-05: 25 ug via INTRAVENOUS
  Filled 2023-04-05: qty 1

## 2023-04-05 MED ORDER — NALOXONE HCL 0.4 MG/ML IJ SOLN: 0.4000 mg | INTRAMUSCULAR | Status: DC | PRN

## 2023-04-05 MED ORDER — HYDROMORPHONE HCL 1 MG/ML IJ SOLN
1.0000 mg | Freq: Once | INTRAMUSCULAR | Status: AC
  Administered 2023-04-05: 1 mg via INTRAVENOUS
  Filled 2023-04-05: qty 1

## 2023-04-05 MED ORDER — HYDROMORPHONE HCL 1 MG/ML IJ SOLN
1.0000 mg | INTRAMUSCULAR | Status: DC | PRN
  Administered 2023-04-05 – 2023-04-06 (×2): 1 mg via INTRAVENOUS
  Filled 2023-04-05 (×2): qty 1

## 2023-04-05 NOTE — Progress Notes (Signed)
Plan of Care Note for accepted transfer  Patient: Rhonda Davenport    AVW:[id number]  DOA: 04/05/2023     Nursing staff, Please call [facility] Admits & Consults System-Wide number on Amion as soon as patient's arrival to the unit (not the listed attending) so that the appropriate admitting provider can evaluate the pt. ASAP to avoid any delay in care.  Facility requesting transfer: [facility] Requesting Provider: Tegeler, Canary Brim, MD   Reason for transfer: admission for pain control Facility course:  pt with PMH of GERD, HTN, hypothyroidism. Presented with severe neck and head pain ongoing for 2 months. Pt states she has frequent falls, fell 1 week ago and hit head. took oxycodone without any relief. Pt sees a neurosurgery provider at [facility] and was suppose to get MRI on Monday but came to ER due to severe pain.  Was found to have C1 fracture and EDP called on call neurosuregry who recommended Hard C collar and outpt follow up. Due to poor pain control pt was recommended for admission by EDP.  Per EDP pt and family prefer to come to be admitted at [facility] instead of [facility]. Also pt was discussed with Neurosurgery Ms Doran Durand again who will see the pt on arrival to [facility] in consultation. Pt has seen Dr Lovell Sheehan in the past.  Tele requested to ensure that pt does not have any arrhythmia causing frequent falls.  Pt takes plavix    Plan of care: The patient is accepted for admission to Telemetry unit, at [facility]. Needs Neurosurgery eval on arrival.   Author: Lynden Oxford, MD  04/05/2023  Check [email or web address] for on-call coverage.

## 2023-04-05 NOTE — ED Notes (Signed)
Pt is now resting calmly.  Niece is supportive at the bedside.

## 2023-04-05 NOTE — ED Notes (Signed)
Rigid c-collar placed assisted by Dr Rush Landmark, pt sleepy, rouses to verbal.  Family raised concern about management with neck fx at home.

## 2023-04-05 NOTE — ED Notes (Signed)
Pt is having pain again, restless.  Family member is at the bedside.  EDP notified, await new orders

## 2023-04-05 NOTE — ED Notes (Signed)
ED Provider at bedside. 

## 2023-04-05 NOTE — ED Triage Notes (Addendum)
Pt states severe neck and head pain x 1.5-2 months  States took oxycodone pta for pain with little relief  MRI scheduled for Monday at 1200 Pt state pain worse with movement  Pt states frequent falls, fell 1 week ago and hit head, did CT and it was negative   Pt takes plavix

## 2023-04-05 NOTE — ED Notes (Signed)
ED TO INPATIENT HANDOFF REPORT  ED Nurse Name and Phone #: Gearlean Alf Name/Age/Gender Rhonda Davenport 82 y.o. female Room/Bed: MH08/MH08  Code Status   Code Status: Prior  Home/SNF/Other Home Patient oriented to: self, place, time, and situation Is this baseline? Yes   Triage Complete: Triage complete  Chief Complaint C1 cervical fracture (HCC) [S12.000A]  Triage Note Pt states severe neck and head pain x 1.5-2 months  States took oxycodone pta for pain with little relief  MRI scheduled for Monday at 1200 Pt state pain worse with movement  Pt states frequent falls, fell 1 week ago and hit head, did CT and it was negative   Pt takes plavix     Allergies Allergies  Allergen Reactions   Ibuprofen Itching   Oxycodone     Other reaction(s): Lethargy (intolerance), Malaise (intolerance) "sleeps for 2 days"   Tramadol Itching   Atorvastatin     Other reaction(s): Myalgias (intolerance)   Ceftriaxone Other (See Comments)   Benztropine Rash   Codeine Palpitations   Diphenhydramine Hcl Itching    Tolerates ATARAX   Hydrocodone-Acetaminophen Rash   Leflunomide Rash   Meloxicam Itching    Level of Care/Admitting Diagnosis ED Disposition     ED Disposition  Admit   Condition  --   Comment  Hospital Area: [facility] [[id number]]  Level of Care: Telemetry Medical [104]  Interfacility transfer: Yes  May place patient in observation at [facility] or [facility] if equivalent level of care is available:: No  Covid Evaluation: Asymptomatic - no recent exposure (last 10 days) testing not required  Diagnosis: C1 cervical fracture [facility]) [[id number]]  Admitting Physician: Rolly Salter [[id number]]  Attending Physician: Rolly Salter [[id number]]          B Medical/Surgery History Past Medical History:  Diagnosis Date   Anemia    Chronic low back pain    Fall    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   GERD (gastroesophageal reflux disease)    H/O blood clots     pt states had blood clot in her rt arm   High cholesterol    Hypertension    Still's disease (HCC)    Thyroid disease    Hypothyroidism   Past Surgical History:  Procedure Laterality Date   ABDOMINAL HYSTERECTOMY     APPENDECTOMY     CARPAL TUNNEL RELEASE Bilateral    JOINT REPLACEMENT     TKR, left   SHOULDER SURGERY     Bilateral   TONSILLECTOMY       A IV Location/Drains/Wounds Patient Lines/Drains/Airways Status     Active Line/Drains/Airways     Name Placement date Placement time Site Days   Peripheral IV 04/05/23 20 G Right Antecubital 04/05/23  1038  Antecubital  less than 1            Intake/Output Last 24 hours No intake or output data in the 24 hours ending 04/05/23 1718  Labs/Imaging Results for orders placed or performed during the hospital encounter of 04/05/23 (from the past 48 hour(s))  CBC with Differential     Status: Abnormal   Collection Time: 04/05/23 10:39 AM  Result Value Ref Range   WBC 8.6 4.0 - 10.5 K/uL   RBC 3.95 3.87 - 5.11 MIL/uL   Hemoglobin 11.5 (L) 12.0 - 15.0 g/dL   HCT 19.1 (L) 47.8 - 29.5 %   MCV 89.1 80.0 - 100.0 fL   MCH 29.1 26.0 - 34.0  pg   MCHC 32.7 30.0 - 36.0 g/dL   RDW 40.9 81.1 - 91.4 %   Platelets 362 150 - 400 K/uL   nRBC 0.0 0.0 - 0.2 %   Neutrophils Relative % 62 %   Neutro Abs 5.3 1.7 - 7.7 K/uL   Lymphocytes Relative 24 %   Lymphs Abs 2.1 0.7 - 4.0 K/uL   Monocytes Relative 11 %   Monocytes Absolute 1.0 0.1 - 1.0 K/uL   Eosinophils Relative 2 %   Eosinophils Absolute 0.2 0.0 - 0.5 K/uL   Basophils Relative 1 %   Basophils Absolute 0.0 0.0 - 0.1 K/uL   Immature Granulocytes 0 %   Abs Immature Granulocytes 0.02 0.00 - 0.07 K/uL    Comment: Performed at [facility], [address] [place], [place] [address]  Comprehensive metabolic panel     Status: Abnormal   Collection Time: 04/05/23 10:39 AM  Result Value Ref Range   Sodium 136 135 - 145 mmol/L   Potassium 4.1 3.5 - 5.1 mmol/L    Chloride 105 98 - 111 mmol/L   CO2 21 (L) 22 - 32 mmol/L   Glucose, Bld 98 70 - 99 mg/dL    Comment: Glucose reference range applies only to samples taken after fasting for at least 8 hours.   BUN 14 8 - 23 mg/dL   Creatinine, Ser 6.21 0.44 - 1.00 mg/dL   Calcium 8.9 8.9 - 30.8 mg/dL   Total Protein 7.3 6.5 - 8.1 g/dL   Albumin 3.8 3.5 - 5.0 g/dL   AST 19 15 - 41 U/L   ALT 9 0 - 44 U/L   Alkaline Phosphatase 53 38 - 126 U/L   Total Bilirubin 0.5 0.3 - 1.2 mg/dL   GFR, Estimated >65 >78 mL/min    Comment: (NOTE) Calculated using the CKD-EPI Creatinine Equation (2021)    Anion gap 10 5 - 15    Comment: Performed at [facility], [address] [place], [place] [address]   CT [facility] HEAD NECK W WO CM  Result Date: 04/05/2023 CLINICAL DATA:  Severe pain in head and neck. Rule out dissection, bleed, skull fracture, hematoma. EXAM: CT ANGIOGRAPHY HEAD AND NECK WITH AND WITHOUT CONTRAST TECHNIQUE: Multidetector CT imaging of the head and neck was performed using the standard protocol during bolus administration of intravenous contrast. Multiplanar CT image reconstructions and MIPs were obtained to evaluate the vascular anatomy. Carotid stenosis measurements (when applicable) are obtained utilizing NASCET criteria, using the distal internal carotid diameter as the denominator. RADIATION DOSE REDUCTION: This exam was performed according to the departmental dose-optimization program which includes automated exposure control, adjustment of the mA and/or kV according to patient size and/or use of iterative reconstruction technique. CONTRAST:  75mL OMNIPAQUE IOHEXOL 350 MG/ML SOLN COMPARISON:  CT head and cervical spine 08/01/2022 FINDINGS: CT HEAD FINDINGS Brain: There is no acute intracranial hemorrhage, extra-axial fluid collection, or acute infarct There is parenchymal volume loss with prominence of the ventricular system and extra-axial CSF spaces. Gray-white differentiation is preserved.  Patchy hypodensity in the supratentorial white matter likely reflects sequela of chronic small-vessel ischemic change The pituitary and suprasellar region are normal. There is no mass lesion. There is no mass effect or midline shift. Vascular: See below. Skull: Normal. Negative for fracture or focal lesion. Sinuses/Orbits: There is complete opacification of the left maxillary sinus with hyperostosis consistent with chronic sinusitis. Bilateral lens implants are in place. The globes and orbits  are otherwise unremarkable. Other: None. Review of the MIP images confirms the above findings CTA NECK FINDINGS Aortic arch: The imaged aortic arch is normal. The origins of the major branch vessels are patent. The subclavian arteries are patent to the level imaged. Right carotid system: The right common, internal, and external carotid arteries are patent, without hemodynamically significant stenosis or occlusion there is no evidence of dissection or aneurysm. Left carotid system: The left common, internal, and external carotid arteries are patent, with minimal plaque of the bifurcation but no hemodynamically significant stenosis or occlusion. There is no evidence of dissection or aneurysm. Vertebral arteries: The vertebral arteries are patent, without hemodynamically significant stenosis or occlusion. There is no evidence of dissection or aneurysm. Skeleton: There is advanced degenerative change throughout the cervical spine with 6 mm anterolisthesis of C4 on C5, unchanged. There is severe multilevel disc degeneration and degenerative endplate change with reversal of the normal curvature centered at C5. There is a nondisplaced fracture through the anterior C1 ring which is age-indeterminate but new since the CT cervical spine from 08/01/2022. The fracture margins appear distinct. There is no visible canal hematoma. There is no significant prevertebral soft tissue swelling. Other neck: The soft tissues of the neck are  unremarkable. Upper chest: The esophagus is patulous and fluid-filled. The imaged lung apices are clear. Review of the MIP images confirms the above findings CTA HEAD FINDINGS Anterior circulation: There is mild calcified plaque in the intracranial ICAs without significant stenosis or occlusion. The bilateral MCAs are patent, without proximal stenosis or occlusion. The bilateral ACAs are patent, without proximal stenosis or occlusion. There is no aneurysm or AVM. Posterior circulation: The bilateral V4 segments are patent with mild plaque on the left but no significant stenosis or occlusion. The basilar artery is patent. The major cerebellar arteries appear patent. The bilateral PCAs are patent, without proximal stenosis or occlusion. Right larger than left posterior communicating arteries are identified. There is no aneurysm or AVM. Venous sinuses: Patent. Anatomic variants: None. Review of the MIP images confirms the above findings IMPRESSION: 1. No acute intracranial pathology. 2. Patent vasculature of the head and neck with no hemodynamically significant stenosis, occlusion, or dissection. 3. Nondisplaced fracture through the anterior C1 ring is age-indeterminate but new since the CT cervical spine from 08/01/2022. The fracture margins appear distinct raising suspicion for acute to subacute chronicity but there is no visible canal hematoma or significant prevertebral soft tissue swelling. 4. Chronic left maxillary sinusitis. 5. Patulous and fluid-filled esophagus. Correlate with any history of reflux. Electronically Signed   By: Lesia Hausen M.D.   On: 04/05/2023 11:59    Pending Labs Unresulted Labs (From admission, onward)    None       Vitals/Pain Today's Vitals   04/05/23 1420 04/05/23 1542 04/05/23 1600 04/05/23 1700  BP: 136/71 (!) 154/81 125/63 117/63  Pulse: 86 89 80 79  Resp: 16     Temp:      TempSrc:      SpO2: 95% 94% 94% 98%  Weight:      Height:      PainSc: 0-No pain Asleep       Isolation Precautions No active isolations  Medications Medications  HYDROmorphone (DILAUDID) injection 1 mg (1 mg Intravenous Given 04/05/23 1045)  ondansetron (ZOFRAN) injection 4 mg (4 mg Intravenous Given 04/05/23 1043)  iohexol (OMNIPAQUE) 350 MG/ML injection 75 mL (75 mLs Intravenous Contrast Given 04/05/23 1118)  ondansetron (ZOFRAN) injection 4 mg (4 mg Intravenous Given  04/05/23 1537)  fentaNYL (SUBLIMAZE) injection 25 mcg (25 mcg Intravenous Given 04/05/23 1535)    Mobility walks     Focused Assessments Pt is in C-collar but per report I received hard collar would not fit her due to her anatomy  CT results: Nondisplaced fracture through the anterior C1 ring is age-indeterminate but new since the CT cervical spine from 08/01/2022. The fracture margins appear distinct raising suspicion for acute to subacute chronicity but there is no visible canal hematoma or significant prevertebral soft tissue swelling.  R Recommendations: See Admitting Provider Note  Report given to:   Additional Notes: Pt is in depend, she normally wears incontinence pads at home.

## 2023-04-05 NOTE — ED Notes (Signed)
RAC IV infiltrated, removed and applied coban dressing. New iv placed

## 2023-04-05 NOTE — H&P (Signed)
History and Physical    Rhonda Davenport:[id number] DOB: [birth year] DOA: 04/05/2023  PCP: Clemencia Course, PA-C  Patient coming from: Home  Chief Complaint: Neck pain  HPI: Rhonda Davenport is a 82 y.o. female with medical history significant of GERD, hypertension, hyperlipidemia, hypothyroidism, adult-onset still's disease presented to ED with severe neck and head pain ongoing for 2 months.  Patient reported frequent falls, fell a week ago and hit her head.  Took oxycodone without any relief.  Patient see seen by a neurosurgery provider at [facility] regional and was supposed to get an MRI on Monday but came to the ED due to severe pain.    CTA head and neck done in the ED: "IMPRESSION: 1. No acute intracranial pathology. 2. Patent vasculature of the head and neck with no hemodynamically significant stenosis, occlusion, or dissection. 3. Nondisplaced fracture through the anterior C1 ring is age-indeterminate but new since the CT cervical spine from 08/01/2022. The fracture margins appear distinct raising suspicion for acute to subacute chronicity but there is no visible canal hematoma or significant prevertebral soft tissue swelling. 4. Chronic left maxillary sinusitis. 5. Patulous and fluid-filled esophagus. Correlate with any history of reflux."  Patient continued to have uncontrolled pain despite several doses of IV analgesics including Dilaudid and fentanyl.  ED physician spoke to on-call provider for neurosurgery who recommended hard c-collar and will see the patient in consultation.  Labs showing mild normocytic anemia (hemoglobin 11.5).  History limited as patient appears uncomfortable secondary to pain.  She reports falling but is not able to tell me when she fell.  She is complaining of severe neck pain and states she previously saw a doctor in [facility] after her fall.  She is not able to answer any additional questions at this time and constantly saying "I don't  know" and "oh god please help me."    Review of Systems:  Review of Systems  Reason unable to perform ROS: Patient is in pain and not able to answer additional questions at this time.    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 Her smoking use included cigarettes. She has a 26.00 pack-year smoking history. She has never used smokeless tobacco. She reports that she does not drink alcohol and does not use drugs.  Allergies  Allergen Reactions   Ibuprofen Itching   Oxycodone     Other reaction(s): Lethargy (intolerance), Malaise (intolerance) "sleeps for 2 days"   Tramadol Itching   Atorvastatin     Other reaction(s): Myalgias (intolerance)   Ceftriaxone Other (See Comments)   Benztropine Rash   Codeine Palpitations   Diphenhydramine Hcl Itching    Tolerates ATARAX   Hydrocodone-Acetaminophen Rash   Leflunomide Rash  Meloxicam Itching    Family History  Problem Relation Age of Onset   Rheum arthritis Mother    Rheum arthritis Father    Healthy Brother     Prior to Admission medications   Medication Sig Start Date End Date Taking? Authorizing Provider  acetaminophen (TYLENOL) 325 MG tablet Take by mouth. 04/13/16   [provider]  albuterol (PROVENTIL) 4 MG tablet Take 4 mg by mouth 3 (three) times daily.    [provider]  amoxicillin (AMOXIL) 875 MG tablet  04/21/18   [provider]  aspirin 81 MG  tablet Take 81 mg by mouth at bedtime.    [provider]  benzonatate (TESSALON) 100 MG capsule Take 2 capsules (200 mg total) by mouth 2 (two) times daily as needed for cough. 11/18/15   Muthersbaugh, Dahlia Client, PA-C  busPIRone (BUSPAR) 10 MG tablet Take 10 mg by mouth 2 (two) times daily.    [provider]  clonazePAM (KLONOPIN) 1 MG tablet Take 0.5-1 tablets by mouth See admin instructions. 0.5 tablet twice daily as needed for anxiety and 1 tablet at bedtime. 04/16/15   [provider]  clopidogrel (PLAVIX) 75 MG tablet Take 1 tablet by mouth daily. 04/06/15   [provider]  Difluprednate (DUREZOL OP) Apply 1 drop to eye 3 (three) times daily.    [provider]  DULoxetine (CYMBALTA) 30 MG capsule Take 30 mg by mouth daily. 10/06/15   [provider]  ergocalciferol (VITAMIN D2) 50000 UNITS capsule Take 50,000 Units by mouth every Thursday.     [provider]  escitalopram (LEXAPRO) 10 MG tablet Take 10 mg by mouth daily.    [provider]  fentaNYL (DURAGESIC - DOSED MCG/HR) 25 MCG/HR patch  03/31/18   [provider]  folic acid (FOLVITE) 1 MG tablet 1 mg daily. 12/04/14   [provider]  gabapentin (NEURONTIN) 100 MG capsule  02/08/18   [provider]  HYDROmorphone (DILAUDID) 2 MG tablet Take 1 tablet (2 mg total) by mouth every 6 (six) hours as needed (for cough and pain). Patient not taking: Reported on 12/21/2015 12/20/15   Dorothea Ogle, MD  hydrOXYzine (ATARAX/VISTARIL) 25 MG tablet Take 25 mg by mouth 3 (three) times daily as needed for itching.    [provider]  levothyroxine (SYNTHROID, LEVOTHROID) 50 MCG tablet Take 50 mcg by mouth daily before breakfast.     [provider]  losartan (COZAAR) 50 MG tablet Take 50 mg by mouth daily. 10/18/15   [provider]  magic mouthwash SOLN SWISH AND SPIT 5 ML EVERY 4-6 HOURS AS NEEDED 04/06/18   [provider]   meloxicam (MOBIC) 15 MG tablet Take 1 tablet by mouth every morning. 02/05/15   [provider]  methotrexate (RHEUMATREX) 2.5 MG tablet Take 15 mg by mouth once a week. Caution:Chemotherapy. Protect from light. Takes on Wednesday's    [provider]  metoprolol tartrate (LOPRESSOR) 25 MG tablet Take 1 tablet (25 mg total) by mouth 2 (two) times daily. Patient not taking: Reported on 12/19/2015 03/29/14   Alison Murray, MD  mirabegron ER (MYRBETRIQ) 50 MG TB24 tablet Take 50 mg by mouth daily. 11/07/15   [provider]  montelukast (SINGULAIR) 10 MG tablet Take 10 mg by mouth at bedtime.    [provider]  omeprazole (PRILOSEC) 20 MG capsule Take 20 mg by mouth daily.    [provider]  ondansetron (ZOFRAN ODT) 4  MG disintegrating tablet 4mg  ODT q4 hours prn nausea/vomit Patient not taking: Reported on 04/23/2018 11/18/15   Muthersbaugh, Dahlia Client, PA-C  phentermine (ADIPEX-P) 37.5 MG tablet Take 1 tablet by mouth daily with breakfast. 10/29/15   [provider]  polyvinyl alcohol (LIQUIFILM TEARS) 1.4 % ophthalmic solution Place 1 drop into both eyes 3 (three) times daily as needed for dry eyes.    [provider]  potassium chloride SA (K-DUR,KLOR-CON) 20 MEQ tablet Take 20 mEq by mouth 2 (two) times daily.    [provider]  pravastatin (PRAVACHOL) 20 MG tablet Take 20 mg by mouth daily.    [provider]  promethazine (PHENERGAN) 12.5 MG tablet Take 1 tablet (12.5 mg total) by mouth every 6 (six) hours as needed for nausea or vomiting. 12/11/22   Long, Arlyss Repress, MD  sulfamethoxazole-trimethoprim (BACTRIM DS,SEPTRA DS) 800-160 MG tablet Take 1 tablet by mouth 2 (two) times daily. Started 01/05 for 10 days    [provider]  zolpidem (AMBIEN) 5 MG tablet Take 1 tablet (5 mg total) by mouth at bedtime as needed for sleep. Patient not taking: Reported on 12/19/2015 03/29/14   Alison Murray, MD    Physical  Exam: Vitals:   04/05/23 1700 04/05/23 1748 04/05/23 1900 04/05/23 2026  BP: 117/63  (!) 150/100 (!) 167/85  Pulse: 79  81 97  Resp:   15   Temp:  99.1 F (37.3 C)  99.4 F (37.4 C)  TempSrc:  Tympanic  Oral  SpO2: 98%  100% 99%  Weight:      Height:        Physical Exam Vitals reviewed.  Constitutional:      General: She is not in acute distress. HENT:     Head: Normocephalic and atraumatic.  Eyes:     Extraocular Movements: Extraocular movements intact.  Neck:     Comments: Hard collar in place Cardiovascular:     Rate and Rhythm: Normal rate and regular rhythm.     Pulses: Normal pulses.  Pulmonary:     Effort: Pulmonary effort is normal. No respiratory distress.     Breath sounds: Normal breath sounds. No wheezing or rales.  Abdominal:     General: Bowel sounds are normal. There is no distension.     Palpations: Abdomen is soft.     Tenderness: There is no abdominal tenderness.  Musculoskeletal:     Right lower leg: No edema.     Left lower leg: No edema.  Skin:    General: Skin is warm and dry.  Neurological:     General: No focal deficit present.     Mental Status: She is alert and oriented to person, place, and time.     Comments: Strength 5 out of 5 in bilateral upper and lower extremities.     Labs on Admission: I have personally reviewed following labs and imaging studies  CBC: Recent Labs  Lab 04/05/23 1039  WBC 8.6  NEUTROABS 5.3  HGB 11.5*  HCT 35.2*  MCV 89.1  PLT 362   Basic Metabolic Panel: Recent Labs  Lab 04/05/23 1039  NA 136  K 4.1  CL 105  CO2 21*  GLUCOSE 98  BUN 14  CREATININE 0.87  CALCIUM 8.9   GFR: Estimated Creatinine Clearance: 28.4 mL/min (by C-G formula based on SCr of 0.87 mg/dL). Liver Function Tests: Recent Labs  Lab 04/05/23 1039  AST 19  ALT 9  ALKPHOS 53  BILITOT 0.5  PROT  7.3  ALBUMIN 3.8   No results for input(s): "LIPASE", "AMYLASE" in the last 168 hours. No results for input(s): "AMMONIA" in  the last 168 hours. Coagulation Profile: No results for input(s): "INR", "PROTIME" in the last 168 hours. Cardiac Enzymes: No results for input(s): "CKTOTAL", "CKMB", "CKMBINDEX", "TROPONINI" in the last 168 hours. BNP (last 3 results) No results for input(s): "PROBNP" in the last 8760 hours. HbA1C: No results for input(s): "HGBA1C" in the last 72 hours. CBG: No results for input(s): "GLUCAP" in the last 168 hours. Lipid Profile: No results for input(s): "CHOL", "HDL", "LDLCALC", "TRIG", "CHOLHDL", "LDLDIRECT" in the last 72 hours. Thyroid Function Tests: No results for input(s): "TSH", "T4TOTAL", "FREET4", "T3FREE", "THYROIDAB" in the last 72 hours. Anemia Panel: No results for input(s): "VITAMINB12", "FOLATE", "FERRITIN", "TIBC", "IRON", "RETICCTPCT" in the last 72 hours. Urine analysis:    Component Value Date/Time   COLORURINE YELLOW 12/21/2015 1249   APPEARANCEUR CLOUDY (A) 12/21/2015 1249   LABSPEC 1.021 12/21/2015 1249   PHURINE 5.5 12/21/2015 1249   GLUCOSEU NEGATIVE 12/21/2015 1249   HGBUR TRACE (A) 12/21/2015 1249   BILIRUBINUR NEGATIVE 12/21/2015 1249   KETONESUR NEGATIVE 12/21/2015 1249   PROTEINUR NEGATIVE 12/21/2015 1249   UROBILINOGEN 0.2 04/19/2015 2147   NITRITE NEGATIVE 12/21/2015 1249   LEUKOCYTESUR NEGATIVE 12/21/2015 1249    Radiological Exams on Admission: CT ANGIO HEAD NECK W WO CM  Result Date: 04/05/2023 CLINICAL DATA:  Severe pain in head and neck. Rule out dissection, bleed, skull fracture, hematoma. EXAM: CT ANGIOGRAPHY HEAD AND NECK WITH AND WITHOUT CONTRAST TECHNIQUE: Multidetector CT imaging of the head and neck was performed using the standard protocol during bolus administration of intravenous contrast. Multiplanar CT image reconstructions and MIPs were obtained to evaluate the vascular anatomy. Carotid stenosis measurements (when applicable) are obtained utilizing NASCET criteria, using the distal internal carotid diameter as the denominator.  RADIATION DOSE REDUCTION: This exam was performed according to the departmental dose-optimization program which includes automated exposure control, adjustment of the mA and/or kV according to patient size and/or use of iterative reconstruction technique. CONTRAST:  75mL OMNIPAQUE IOHEXOL 350 MG/ML SOLN COMPARISON:  CT head and cervical spine 08/01/2022 FINDINGS: CT HEAD FINDINGS Brain: There is no acute intracranial hemorrhage, extra-axial fluid collection, or acute infarct There is parenchymal volume loss with prominence of the ventricular system and extra-axial CSF spaces. Gray-white differentiation is preserved. Patchy hypodensity in the supratentorial white matter likely reflects sequela of chronic small-vessel ischemic change The pituitary and suprasellar region are normal. There is no mass lesion. There is no mass effect or midline shift. Vascular: See below. Skull: Normal. Negative for fracture or focal lesion. Sinuses/Orbits: There is complete opacification of the left maxillary sinus with hyperostosis consistent with chronic sinusitis. Bilateral lens implants are in place. The globes and orbits are otherwise unremarkable. Other: None. Review of the MIP images confirms the above findings CTA NECK FINDINGS Aortic arch: The imaged aortic arch is normal. The origins of the major branch vessels are patent. The subclavian arteries are patent to the level imaged. Right carotid system: The right common, internal, and external carotid arteries are patent, without hemodynamically significant stenosis or occlusion there is no evidence of dissection or aneurysm. Left carotid system: The left common, internal, and external carotid arteries are patent, with minimal plaque of the bifurcation but no hemodynamically significant stenosis or occlusion. There is no evidence of dissection or aneurysm. Vertebral arteries: The vertebral arteries are patent, without hemodynamically significant stenosis or occlusion. There is  no  evidence of dissection or aneurysm. Skeleton: There is advanced degenerative change throughout the cervical spine with 6 mm anterolisthesis of C4 on C5, unchanged. There is severe multilevel disc degeneration and degenerative endplate change with reversal of the normal curvature centered at C5. There is a nondisplaced fracture through the anterior C1 ring which is age-indeterminate but new since the CT cervical spine from 08/01/2022. The fracture margins appear distinct. There is no visible canal hematoma. There is no significant prevertebral soft tissue swelling. Other neck: The soft tissues of the neck are unremarkable. Upper chest: The esophagus is patulous and fluid-filled. The imaged lung apices are clear. Review of the MIP images confirms the above findings CTA HEAD FINDINGS Anterior circulation: There is mild calcified plaque in the intracranial ICAs without significant stenosis or occlusion. The bilateral MCAs are patent, without proximal stenosis or occlusion. The bilateral ACAs are patent, without proximal stenosis or occlusion. There is no aneurysm or AVM. Posterior circulation: The bilateral V4 segments are patent with mild plaque on the left but no significant stenosis or occlusion. The basilar artery is patent. The major cerebellar arteries appear patent. The bilateral PCAs are patent, without proximal stenosis or occlusion. Right larger than left posterior communicating arteries are identified. There is no aneurysm or AVM. Venous sinuses: Patent. Anatomic variants: None. Review of the MIP images confirms the above findings IMPRESSION: 1. No acute intracranial pathology. 2. Patent vasculature of the head and neck with no hemodynamically significant stenosis, occlusion, or dissection. 3. Nondisplaced fracture through the anterior C1 ring is age-indeterminate but new since the CT cervical spine from 08/01/2022. The fracture margins appear distinct raising suspicion for acute to subacute chronicity but  there is no visible canal hematoma or significant prevertebral soft tissue swelling. 4. Chronic left maxillary sinusitis. 5. Patulous and fluid-filled esophagus. Correlate with any history of reflux. Electronically Signed   By: Lesia Hausen M.D.   On: 04/05/2023 11:59    Assessment and Plan  Intractable neck pain secondary to C1 ring fracture In the setting of frequent falls.  CT showing nondisplaced fracture through the anterior C1 ring which is age-indeterminate but new since the CT cervical spine from 08/01/2022. The fracture margins appear distinct on CT raising suspicion for acute to subacute chronicity but there is no visible canal hematoma or significant prevertebral soft tissue swelling.  No neurologic deficit on exam.  Neurosurgery will consult and recommended hard c-collar which is in place.  Continue pain management.  Frequent falls Placed on telemetry monitoring to assess for possible arrhythmia and ordered fall precautions.  GERD Hypertension Hyperlipidemia Hypothyroidism Adult onset still's disease Pharmacy med rec pending.  DVT prophylaxis: SCDs Code Status: Full code by default.  Unable to discuss CODE STATUS with the patient at this time as she is in pain and answering all questions by saying "I don't know."  Please readdress in the morning. Family Communication: No family available at this time. Level of care: Telemetry bed Admission status: It is my clinical opinion that referral for OBSERVATION is reasonable and necessary in this patient based on the above information provided. The aforementioned taken together are felt to place the patient at high risk for further clinical deterioration. However, it is anticipated that the patient may be medically stable for discharge from the hospital within 24 to 48 hours.   John Giovanni MD [facility]  If 7PM-7AM, please contact night-coverage [email or web address]  04/05/2023, 10:30 PM

## 2023-04-05 NOTE — ED Provider Notes (Signed)
[facility] Provider Note   CSN: [id number] Arrival date & time: 04/05/23  4098     History  Chief Complaint  Patient presents with   Neck Pain    Rhonda Davenport is a 82 y.o. female.  The history is provided by the patient and medical records. No language interpreter was used.  Neck Pain Pain location:  Generalized neck Quality:  Aching Pain radiates to:  Head Pain severity:  Severe Pain is:  Same all the time Onset quality:  Gradual Duration:  1 week Timing:  Constant Progression:  Worsening Chronicity:  New Context: fall   Relieved by:  Nothing Worsened by:  Nothing Ineffective treatments:  None tried Associated symptoms: headaches   Associated symptoms: no chest pain, no fever, no leg pain, no numbness, no photophobia, no tingling, no visual change and no weakness        Home Medications Prior to Admission medications   Medication Sig Start Date End Date Taking? Authorizing Provider  acetaminophen (TYLENOL) 325 MG tablet Take by mouth. 04/13/16   [provider]  albuterol (PROVENTIL) 4 MG tablet Take 4 mg by mouth 3 (three) times daily.    [provider]  amoxicillin (AMOXIL) 875 MG tablet  04/21/18   [provider]  aspirin 81 MG tablet Take 81 mg by mouth at bedtime.    [provider]  benzonatate (TESSALON) 100 MG capsule Take 2 capsules (200 mg total) by mouth 2 (two) times daily as needed for cough. 11/18/15   Muthersbaugh, Dahlia Client, PA-C  busPIRone (BUSPAR) 10 MG tablet Take 10 mg by mouth 2 (two) times daily.    [provider]  clonazePAM (KLONOPIN) 1 MG tablet Take 0.5-1 tablets by mouth See admin instructions. 0.5 tablet twice daily as needed for anxiety and 1 tablet at bedtime. 04/16/15   [provider]  clopidogrel (PLAVIX) 75 MG tablet Take 1 tablet by mouth daily. 04/06/15   [provider]  Difluprednate (DUREZOL OP) Apply 1 drop to eye 3 (three)  times daily.    [provider]  DULoxetine (CYMBALTA) 30 MG capsule Take 30 mg by mouth daily. 10/06/15   [provider]  ergocalciferol (VITAMIN D2) 50000 UNITS capsule Take 50,000 Units by mouth every Thursday.     [provider]  escitalopram (LEXAPRO) 10 MG tablet Take 10 mg by mouth daily.    [provider]  fentaNYL (DURAGESIC - DOSED MCG/HR) 25 MCG/HR patch  03/31/18   [provider]  folic acid (FOLVITE) 1 MG tablet 1 mg daily. 12/04/14   [provider]  gabapentin (NEURONTIN) 100 MG capsule  02/08/18   [provider]  HYDROmorphone (DILAUDID) 2 MG tablet Take 1 tablet (2 mg total) by mouth every 6 (six) hours as needed (for cough and pain). Patient not taking: Reported on 12/21/2015 12/20/15   Dorothea Ogle, MD  hydrOXYzine (ATARAX/VISTARIL) 25 MG tablet Take 25 mg by mouth 3 (three) times daily as needed for itching.    [provider]  levothyroxine (SYNTHROID, LEVOTHROID) 50 MCG tablet Take 50 mcg by mouth daily before breakfast.     [provider]  losartan (COZAAR) 50 MG tablet Take 50 mg by mouth daily. 10/18/15   [provider]  magic mouthwash SOLN SWISH AND SPIT 5 ML EVERY 4-6 HOURS AS NEEDED 04/06/18   [provider]  meloxicam (MOBIC) 15 MG tablet Take 1 tablet by mouth every morning.  02/05/15   [provider]  methotrexate (RHEUMATREX) 2.5 MG tablet Take 15 mg by mouth once a week. Caution:Chemotherapy. Protect from light. Takes on Wednesday's    [provider]  metoprolol tartrate (LOPRESSOR) 25 MG tablet Take 1 tablet (25 mg total) by mouth 2 (two) times daily. Patient not taking: Reported on 12/19/2015 03/29/14   Alison Murray, MD  mirabegron ER (MYRBETRIQ) 50 MG TB24 tablet Take 50 mg by mouth daily. 11/07/15   [provider]  montelukast (SINGULAIR) 10 MG tablet Take 10 mg by mouth at bedtime.    [provider]  omeprazole (PRILOSEC)  20 MG capsule Take 20 mg by mouth daily.    [provider]  ondansetron (ZOFRAN ODT) 4 MG disintegrating tablet 4mg  ODT q4 hours prn nausea/vomit Patient not taking: Reported on 04/23/2018 11/18/15   Muthersbaugh, Dahlia Client, PA-C  phentermine (ADIPEX-P) 37.5 MG tablet Take 1 tablet by mouth daily with breakfast. 10/29/15   [provider]  polyvinyl alcohol (LIQUIFILM TEARS) 1.4 % ophthalmic solution Place 1 drop into both eyes 3 (three) times daily as needed for dry eyes.    [provider]  potassium chloride SA (K-DUR,KLOR-CON) 20 MEQ tablet Take 20 mEq by mouth 2 (two) times daily.    [provider]  pravastatin (PRAVACHOL) 20 MG tablet Take 20 mg by mouth daily.    [provider]  promethazine (PHENERGAN) 12.5 MG tablet Take 1 tablet (12.5 mg total) by mouth every 6 (six) hours as needed for nausea or vomiting. 12/11/22   Long, Arlyss Repress, MD  sulfamethoxazole-trimethoprim (BACTRIM DS,SEPTRA DS) 800-160 MG tablet Take 1 tablet by mouth 2 (two) times daily. Started 01/05 for 10 days    [provider]  zolpidem (AMBIEN) 5 MG tablet Take 1 tablet (5 mg total) by mouth at bedtime as needed for sleep. Patient not taking: Reported on 12/19/2015 03/29/14   Alison Murray, MD      Allergies    Ibuprofen, Oxycodone, Tramadol, Atorvastatin, Ceftriaxone, Benztropine, Codeine, Diphenhydramine hcl, Hydrocodone-acetaminophen, Leflunomide, and Meloxicam    Review of Systems   Review of Systems  Constitutional:  Negative for chills, fatigue and fever.  HENT:  Negative for congestion.   Eyes:  Negative for photophobia.  Respiratory:  Negative for cough, chest tightness, shortness of breath and wheezing.   Cardiovascular:  Negative for chest pain and palpitations.  Gastrointestinal:  Negative for abdominal pain, diarrhea, nausea and vomiting.  Musculoskeletal:  Positive for neck pain. Negative for back pain and neck stiffness.  Skin:  Negative for rash and  wound.  Neurological:  Positive for headaches. Negative for dizziness, tingling, weakness and numbness.  Psychiatric/Behavioral:  Negative for agitation.   All other systems reviewed and are negative.   Physical Exam Updated Vital Signs BP (!) 173/92 (BP Location: Left Arm)   Pulse 84   Temp 98.7 F (37.1 C) (Oral)   Resp 18   Ht 4\' 7"  (1.397 m)   Wt 40.8 kg   SpO2 98%   BMI 20.91 kg/m  Physical Exam Vitals and nursing note reviewed.  Constitutional:      General: She is not in acute distress.    Appearance: She is well-developed. She is not ill-appearing, toxic-appearing or diaphoretic.  HENT:     Head: Normocephalic and atraumatic.     Nose: Nose normal.     Mouth/Throat:     Mouth: Mucous membranes are moist.  Eyes:     Extraocular Movements: Extraocular movements  intact.     Conjunctiva/sclera: Conjunctivae normal.     Pupils: Pupils are equal, round, and reactive to light.  Neck:   Cardiovascular:     Rate and Rhythm: Normal rate and regular rhythm.     Heart sounds: No murmur heard. Pulmonary:     Effort: Pulmonary effort is normal. No respiratory distress.     Breath sounds: Normal breath sounds. No wheezing, rhonchi or rales.  Chest:     Chest wall: No tenderness.  Abdominal:     General: Abdomen is flat.     Palpations: Abdomen is soft.     Tenderness: There is no abdominal tenderness. There is no right CVA tenderness, left CVA tenderness, guarding or rebound.  Musculoskeletal:        General: Tenderness and signs of injury present. No swelling.     Cervical back: Neck supple.  Skin:    General: Skin is warm and dry.     Capillary Refill: Capillary refill takes less than 2 seconds.     Findings: No erythema or rash.  Neurological:     General: No focal deficit present.     Mental Status: She is alert.     Sensory: No sensory deficit.     Motor: No weakness.  Psychiatric:        Mood and Affect: Mood normal.     ED Results / Procedures /  Treatments   Labs (all labs ordered are listed, but only abnormal results are displayed) Labs Reviewed  CBC WITH DIFFERENTIAL/PLATELET - Abnormal; Notable for the following components:      Result Value   Hemoglobin 11.5 (*)    HCT 35.2 (*)    All other components within normal limits  COMPREHENSIVE METABOLIC PANEL - Abnormal; Notable for the following components:   CO2 21 (*)    All other components within normal limits    EKG None  Radiology CT ANGIO HEAD NECK W WO CM  Result Date: 04/05/2023 CLINICAL DATA:  Severe pain in head and neck. Rule out dissection, bleed, skull fracture, hematoma. EXAM: CT ANGIOGRAPHY HEAD AND NECK WITH AND WITHOUT CONTRAST TECHNIQUE: Multidetector CT imaging of the head and neck was performed using the standard protocol during bolus administration of intravenous contrast. Multiplanar CT image reconstructions and MIPs were obtained to evaluate the vascular anatomy. Carotid stenosis measurements (when applicable) are obtained utilizing NASCET criteria, using the distal internal carotid diameter as the denominator. RADIATION DOSE REDUCTION: This exam was performed according to the departmental dose-optimization program which includes automated exposure control, adjustment of the mA and/or kV according to patient size and/or use of iterative reconstruction technique. CONTRAST:  75mL OMNIPAQUE IOHEXOL 350 MG/ML SOLN COMPARISON:  CT head and cervical spine 08/01/2022 FINDINGS: CT HEAD FINDINGS Brain: There is no acute intracranial hemorrhage, extra-axial fluid collection, or acute infarct There is parenchymal volume loss with prominence of the ventricular system and extra-axial CSF spaces. Gray-white differentiation is preserved. Patchy hypodensity in the supratentorial white matter likely reflects sequela of chronic small-vessel ischemic change The pituitary and suprasellar region are normal. There is no mass lesion. There is no mass effect or midline shift. Vascular: See  below. Skull: Normal. Negative for fracture or focal lesion. Sinuses/Orbits: There is complete opacification of the left maxillary sinus with hyperostosis consistent with chronic sinusitis. Bilateral lens implants are in place. The globes and orbits are otherwise unremarkable. Other: None. Review of the MIP images confirms the above findings CTA NECK FINDINGS Aortic arch: The imaged aortic  arch is normal. The origins of the major branch vessels are patent. The subclavian arteries are patent to the level imaged. Right carotid system: The right common, internal, and external carotid arteries are patent, without hemodynamically significant stenosis or occlusion there is no evidence of dissection or aneurysm. Left carotid system: The left common, internal, and external carotid arteries are patent, with minimal plaque of the bifurcation but no hemodynamically significant stenosis or occlusion. There is no evidence of dissection or aneurysm. Vertebral arteries: The vertebral arteries are patent, without hemodynamically significant stenosis or occlusion. There is no evidence of dissection or aneurysm. Skeleton: There is advanced degenerative change throughout the cervical spine with 6 mm anterolisthesis of C4 on C5, unchanged. There is severe multilevel disc degeneration and degenerative endplate change with reversal of the normal curvature centered at C5. There is a nondisplaced fracture through the anterior C1 ring which is age-indeterminate but new since the CT cervical spine from 08/01/2022. The fracture margins appear distinct. There is no visible canal hematoma. There is no significant prevertebral soft tissue swelling. Other neck: The soft tissues of the neck are unremarkable. Upper chest: The esophagus is patulous and fluid-filled. The imaged lung apices are clear. Review of the MIP images confirms the above findings CTA HEAD FINDINGS Anterior circulation: There is mild calcified plaque in the intracranial ICAs  without significant stenosis or occlusion. The bilateral MCAs are patent, without proximal stenosis or occlusion. The bilateral ACAs are patent, without proximal stenosis or occlusion. There is no aneurysm or AVM. Posterior circulation: The bilateral V4 segments are patent with mild plaque on the left but no significant stenosis or occlusion. The basilar artery is patent. The major cerebellar arteries appear patent. The bilateral PCAs are patent, without proximal stenosis or occlusion. Right larger than left posterior communicating arteries are identified. There is no aneurysm or AVM. Venous sinuses: Patent. Anatomic variants: None. Review of the MIP images confirms the above findings IMPRESSION: 1. No acute intracranial pathology. 2. Patent vasculature of the head and neck with no hemodynamically significant stenosis, occlusion, or dissection. 3. Nondisplaced fracture through the anterior C1 ring is age-indeterminate but new since the CT cervical spine from 08/01/2022. The fracture margins appear distinct raising suspicion for acute to subacute chronicity but there is no visible canal hematoma or significant prevertebral soft tissue swelling. 4. Chronic left maxillary sinusitis. 5. Patulous and fluid-filled esophagus. Correlate with any history of reflux. Electronically Signed   By: Lesia Hausen M.D.   On: 04/05/2023 11:59    Procedures Procedures    Medications Ordered in ED Medications  HYDROmorphone (DILAUDID) injection 1 mg (1 mg Intravenous Given 04/05/23 1045)  ondansetron (ZOFRAN) injection 4 mg (4 mg Intravenous Given 04/05/23 1043)  iohexol (OMNIPAQUE) 350 MG/ML injection 75 mL (75 mLs Intravenous Contrast Given 04/05/23 1118)    ED Course/ Medical Decision Making/ A&P                             Medical Decision Making Amount and/or Complexity of Data Reviewed Labs: ordered. Radiology: ordered.  Risk Prescription drug management. Decision regarding hospitalization.    Rhonda Davenport is a 82 y.o. female with a past medical history significant for hypertension, hypercholesterolemia, previous appendectomy, previous hysterectomy, rheumatoid arthritis, previous diverticulitis, CHF, CKD, seizure, and eczema who presents with severe head and neck pain after trauma.  According to patient, about 2 months ago she had a fall and had a CT head was  reassuring but has had some head and neck pain.  She reports that she does take Plavix.  She reports that just over a week ago she had another fall hitting the back of her head and neck and since then has had worsening severe associating pain.  She reports has been screaming and yelling in pain for days and cannot get under control.  She reports that nausea and vomiting with that.  She denies any vision changes, and denies any extremity symptoms such as numbness, tingling, weakness.  Denies any neurological plaints.  Denies loss of bowel or bladder control.  She denies any fevers or chills or infectious symptoms.  She said that she called her doctor and scheduled to have an MRI on Monday of her cervical spine.  She was given prescription for muscle relaxant the other day and reports it is not helping at this time.  On exam, lungs clear and chest nontender.  Back nontender.  Abdomen nontender.  Patient moving all extremities with intact sensation and strength.  Symmetric smile.  Pupils symmetric and reactive with normal extract movements.  Patient has significant pain and tenderness to her neck and occipital area.  Patient is rolling around on the bed crying in pain.  Given her Plavix use and recent head injury with worsening pain, will need CT imaging to rule intracranial bleed.  With the pain in the head and neck in the trauma, will get CTA to rule out acute dissection.  Will get screening labs and get a CT of the head and neck.  Will give her some pain medicine..  She has taken oral Dilaudid in the past we will give IV Dilaudid given her other  allergies and intolerances.  Will also get some nausea medicine.  Anticipate reassessment after workup is completed.   CTA returned showing no vascular dissection but did show evidence of a C1 fracture.  It is unclear if this is acute or subacute however given the patient's acute worsening pain this week after the fall I suspect is more acute.  Patient came back and was somewhat somnolent after the pain medicine but after arousal she now is having significant pain again.  I spoke to neurosurgery who recommended a hard collar and follow-up in clinic however on reassessment she is having still significant pain and lives alone.  I am concerned about her safety with his collar.  Neurosurgery said they would see in consultation if she is admitted for pain control so we will call [facility] to [facility].  Patient will likely need PT/OT and further evaluation.  Of note, patient has a very small anatomy and we had a difficult time finding an appropriate collar for her.  She could not fit in the pediatric collars or a small [place] J so she is in a semihard collar at this time.   Will call medicine for admission for C1 fracture with uncontrolled pain        Final Clinical Impression(s) / ED Diagnoses Final diagnoses:  Closed nondisplaced fracture of first cervical vertebra, unspecified fracture morphology, initial encounter (HCC)  Fall, initial encounter  Neck pain    Clinical Impression: 1. Closed nondisplaced fracture of first cervical vertebra, unspecified fracture morphology, initial encounter (HCC)   2. Fall, initial encounter   3. Neck pain     Disposition: Admit  This note was prepared with assistance of Dragon voice recognition software. Occasional wrong-word or sound-a-like substitutions may have occurred due to the inherent limitations of voice recognition  software.      Alwilda Gilland, Canary Brim, MD 04/05/23 [phone]

## 2023-04-05 NOTE — ED Notes (Signed)
Pt is restless in bed, vomiting small amount, crying out that she is in severe pain and very nauseated, unable to calm pt.  EDP notified and order for medication received.  Removed pt pj top which was soiled in vomit, new BP cuff also for same reason.  Pt was incontinent of urine, pad and pj bottom and sheets soaked.  CLened pt with warm washcloth and applied depend and chuck and new sheets and blanket.  Pt repositioned.

## 2023-04-06 DIAGNOSIS — W19XXXA Unspecified fall, initial encounter: Secondary | ICD-10-CM | POA: Diagnosis present

## 2023-04-06 DIAGNOSIS — R112 Nausea with vomiting, unspecified: Secondary | ICD-10-CM | POA: Diagnosis present

## 2023-04-06 DIAGNOSIS — I6782 Cerebral ischemia: Secondary | ICD-10-CM | POA: Diagnosis present

## 2023-04-06 DIAGNOSIS — E039 Hypothyroidism, unspecified: Secondary | ICD-10-CM | POA: Diagnosis present

## 2023-04-06 DIAGNOSIS — E7800 Pure hypercholesterolemia, unspecified: Secondary | ICD-10-CM | POA: Diagnosis present

## 2023-04-06 DIAGNOSIS — Z7982 Long term (current) use of aspirin: Secondary | ICD-10-CM | POA: Diagnosis not present

## 2023-04-06 DIAGNOSIS — Z7902 Long term (current) use of antithrombotics/antiplatelets: Secondary | ICD-10-CM | POA: Diagnosis not present

## 2023-04-06 DIAGNOSIS — Z79899 Other long term (current) drug therapy: Secondary | ICD-10-CM | POA: Diagnosis not present

## 2023-04-06 DIAGNOSIS — G451 Carotid artery syndrome (hemispheric): Secondary | ICD-10-CM | POA: Diagnosis present

## 2023-04-06 DIAGNOSIS — I130 Hypertensive heart and chronic kidney disease with heart failure and stage 1 through stage 4 chronic kidney disease, or unspecified chronic kidney disease: Secondary | ICD-10-CM | POA: Diagnosis present

## 2023-04-06 DIAGNOSIS — I83018 Varicose veins of right lower extremity with ulcer other part of lower leg: Secondary | ICD-10-CM | POA: Diagnosis present

## 2023-04-06 DIAGNOSIS — S12001A Unspecified nondisplaced fracture of first cervical vertebra, initial encounter for closed fracture: Secondary | ICD-10-CM | POA: Diagnosis not present

## 2023-04-06 DIAGNOSIS — M542 Cervicalgia: Secondary | ICD-10-CM | POA: Diagnosis present

## 2023-04-06 DIAGNOSIS — Z888 Allergy status to other drugs, medicaments and biological substances status: Secondary | ICD-10-CM | POA: Diagnosis not present

## 2023-04-06 DIAGNOSIS — D631 Anemia in chronic kidney disease: Secondary | ICD-10-CM | POA: Diagnosis present

## 2023-04-06 DIAGNOSIS — N1831 Chronic kidney disease, stage 3a: Secondary | ICD-10-CM | POA: Diagnosis present

## 2023-04-06 DIAGNOSIS — Q394 Esophageal web: Secondary | ICD-10-CM | POA: Diagnosis not present

## 2023-04-06 DIAGNOSIS — I471 Supraventricular tachycardia, unspecified: Secondary | ICD-10-CM | POA: Diagnosis present

## 2023-04-06 DIAGNOSIS — I5022 Chronic systolic (congestive) heart failure: Secondary | ICD-10-CM | POA: Diagnosis present

## 2023-04-06 DIAGNOSIS — Z87891 Personal history of nicotine dependence: Secondary | ICD-10-CM | POA: Diagnosis not present

## 2023-04-06 DIAGNOSIS — Z791 Long term (current) use of non-steroidal anti-inflammatories (NSAID): Secondary | ICD-10-CM | POA: Diagnosis not present

## 2023-04-06 DIAGNOSIS — L97819 Non-pressure chronic ulcer of other part of right lower leg with unspecified severity: Secondary | ICD-10-CM | POA: Diagnosis present

## 2023-04-06 DIAGNOSIS — M061 Adult-onset Still's disease: Secondary | ICD-10-CM | POA: Diagnosis present

## 2023-04-06 DIAGNOSIS — Z79890 Hormone replacement therapy (postmenopausal): Secondary | ICD-10-CM | POA: Diagnosis not present

## 2023-04-06 DIAGNOSIS — Z885 Allergy status to narcotic agent status: Secondary | ICD-10-CM | POA: Diagnosis not present

## 2023-04-06 DIAGNOSIS — R64 Cachexia: Secondary | ICD-10-CM | POA: Diagnosis present

## 2023-04-06 MED ORDER — METOPROLOL TARTRATE 25 MG PO TABS
25.0000 mg | ORAL_TABLET | Freq: Two times a day (BID) | ORAL | Status: DC
  Administered 2023-04-06 – 2023-04-08 (×5): 25 mg via ORAL
  Filled 2023-04-06 (×5): qty 1

## 2023-04-06 MED ORDER — LABETALOL HCL 5 MG/ML IV SOLN
5.0000 mg | INTRAVENOUS | Status: DC | PRN
  Administered 2023-04-06: 5 mg via INTRAVENOUS
  Filled 2023-04-06: qty 4

## 2023-04-06 MED ORDER — LEVOTHYROXINE SODIUM 50 MCG PO TABS
50.0000 ug | ORAL_TABLET | Freq: Every day | ORAL | Status: DC
  Administered 2023-04-07 – 2023-04-09 (×3): 50 ug via ORAL
  Filled 2023-04-06 (×3): qty 1

## 2023-04-06 MED ORDER — HYDROXYZINE HCL 25 MG PO TABS
25.0000 mg | ORAL_TABLET | Freq: Two times a day (BID) | ORAL | Status: DC | PRN
  Administered 2023-04-06 – 2023-04-08 (×4): 25 mg via ORAL
  Filled 2023-04-06 (×4): qty 1

## 2023-04-06 MED ORDER — MELOXICAM 7.5 MG PO TABS
15.0000 mg | ORAL_TABLET | ORAL | Status: DC
  Administered 2023-04-06 – 2023-04-09 (×4): 15 mg via ORAL
  Filled 2023-04-06 (×4): qty 2

## 2023-04-06 MED ORDER — CLOPIDOGREL BISULFATE 75 MG PO TABS
75.0000 mg | ORAL_TABLET | Freq: Every day | ORAL | Status: DC
  Administered 2023-04-06 – 2023-04-08 (×3): 75 mg via ORAL
  Filled 2023-04-06 (×3): qty 1

## 2023-04-06 MED ORDER — ACETAMINOPHEN 325 MG PO TABS
325.0000 mg | ORAL_TABLET | Freq: Four times a day (QID) | ORAL | Status: DC | PRN
  Administered 2023-04-06 – 2023-04-07 (×2): 325 mg via ORAL
  Filled 2023-04-06 (×2): qty 1

## 2023-04-06 MED ORDER — BUSPIRONE HCL 10 MG PO TABS
15.0000 mg | ORAL_TABLET | Freq: Two times a day (BID) | ORAL | Status: DC
  Administered 2023-04-06 – 2023-04-08 (×6): 15 mg via ORAL
  Filled 2023-04-06 (×6): qty 2

## 2023-04-06 MED ORDER — VENLAFAXINE HCL ER 75 MG PO CP24
75.0000 mg | ORAL_CAPSULE | Freq: Every day | ORAL | Status: DC
  Administered 2023-04-07 – 2023-04-08 (×2): 75 mg via ORAL
  Filled 2023-04-06 (×2): qty 1

## 2023-04-06 MED ORDER — PANTOPRAZOLE SODIUM 40 MG PO TBEC
40.0000 mg | DELAYED_RELEASE_TABLET | Freq: Every day | ORAL | Status: DC
  Administered 2023-04-06 – 2023-04-08 (×3): 40 mg via ORAL
  Filled 2023-04-06 (×3): qty 1

## 2023-04-06 MED ORDER — HYDROXYZINE HCL 10 MG PO TABS
10.0000 mg | ORAL_TABLET | Freq: Once | ORAL | Status: AC | PRN
  Administered 2023-04-06: 10 mg via ORAL
  Filled 2023-04-06: qty 1

## 2023-04-06 MED ORDER — HYDROMORPHONE HCL 2 MG PO TABS
1.0000 mg | ORAL_TABLET | ORAL | Status: DC | PRN
  Administered 2023-04-06 – 2023-04-08 (×8): 1 mg via ORAL
  Filled 2023-04-06 (×8): qty 1

## 2023-04-06 MED ORDER — ASPIRIN 81 MG PO TBEC
81.0000 mg | DELAYED_RELEASE_TABLET | Freq: Every day | ORAL | Status: DC
  Administered 2023-04-06 – 2023-04-08 (×3): 81 mg via ORAL
  Filled 2023-04-06 (×4): qty 1

## 2023-04-06 NOTE — Hospital Course (Signed)
Baclofen 5 mg TID (last filled 04/04/23)  Buspirone 15 mg BID (last filled 02/13/23, 90 DS)  Desvenlafaxine 100 mg daily (last filled 03/10/23, 90 DS)  Hydroxyzine 50 mg daily (last filled 03/18/23, 30 DS- inconsistent fills, likely taking PRN)  Mirtazapine 15 mg daily (last filled 02/25/23, 30 DS)  Tizanidine 2 mg BID (last filled 03/25/23, 90 DS)  Trazodone 50 mg daily (likely QHS; last filled 03/03/23, 90 DS)

## 2023-04-06 NOTE — Progress Notes (Addendum)
PROGRESS NOTE   Rhonda Davenport  ZOX:[id number] DOB: 31-Mar-1941 DOA: 04/05/2023 PCP: Clemencia Course, PA-C  Brief Narrative:   82 year old home dwelling white female Known polypharmacy, prior CVA/TIA HFrEF EF 40-45% with CAD Rheumatoid arthritis followed by Dr. Deanne Coffer locally Adult onset stills disease CKD 4 Hypothyroidism Hospitalization [facility] 2/13 through 2/15 with pneumonia ill-defined opacity left base and treated appropriately Right pretibial venous stasis ulcer by [facility] c nonadherent dressing and follows with Trena Platt of the wound care center Known benign-appearing GE junction web not allowing 13 mm tablet of barium to pass along with moderate sliding hiatal hernia and severe reflux managed by Marliss Coots with associated weight loss over the past year year and a half  She fell about 1 week ago and hit her head took oxycodone without relief and saw Dr. Verlin Dike at [facility] regional supposed to get an MRI on 4/29 but because of severe pain came to [facility], ED-c-collar was placed at the recommendation of neurosurgery on-call   Hospital-Problem based course  Neck pain with C1 ring fracture Age-indeterminate but new since prior CT spine 08/01/2022 Keep c-collar Discussed with Dr. Kendell Bane Dawley of neurosurgery and the patient should not need any acute surgery and should follow-up with her usual neurosurgeon Dr. Noel Gerold on discharge Pain seems moderately controlled--using Mobic 15 every morning, Tylenol for mild pain 325 every 6 as needed Therapy to evaluate for mobility and safety at home Keep on monitors for 24 hours to rule out occult arrhythmia-PT to see  Rheumatoid arthritis follows with Dr. Deanne Coffer Does not appear to be on any disease modifying therapy Continue Mobic with meals  Prior TIAs/stroke-no records-continue Plavix 75, aspirin 81 and outpatient determination of next steps and de-escalation  HFrEF EF 40% previous CAD unknown  history Aspirin Plavix as above Continue Lopressor 25 twice daily  Anxiety continue BuSpar 15 twice daily, hydroxyzine 25 twice daily as needed itching/anxiety Resume Pristiq equivalent, effexor 75  Esophageal web/hiatal hernia with severe reflux recently Continue Protonix 40 daily  Right pretibial venous stasis ulcer Traumatic right knee wound secondary to fall Orders placed for PolyMem Ag padded dressing and Unna boot    DVT prophylaxis: SCD Code Status: Full Family Communication: Relatives at bedside Disposition:  Status is: Observation The patient will require care spanning > 2 midnights and should be moved to inpatient because:   Requires further workup  Patient is at relatively high risk of polypharmacy and will need outpatient nursing to manage her meds with a med planner  Subjective: Alert coherent no distress looks comfortable although a little sleepy this morning She cannot remember some of her meds and is not completely sure what she takes Her pain is moderately controlled (she rates it at a 9 or 10 but looks extremely comfortable laying flat in the brd)  we had a long discussion about pain meds and how they may precipitate falls and a long discussion about polypharmacy  Objective: Vitals:   04/06/23 0002 04/06/23 0052 04/06/23 0300 04/06/23 1108  BP: (!) 173/90 (!) 189/101 (!) 153/87 (!) 155/87  Pulse: 92 (!) 101 85 (!) 110  Resp:   16 18  Temp:   98.8 F (37.1 C)   TempSrc:   Oral   SpO2:   99% 97%  Weight:      Height:        Intake/Output Summary (Last 24 hours) at 04/06/2023 1501 Last data filed at 04/06/2023 0940 Gross per 24 hour  Intake --  Output 550 ml  Net -550 ml   Filed Weights   04/05/23 0930  Weight: 40.8 kg    Examination:  Cachectic white female bitemporal supraclavicular wasting chest is clear no wheeze rales S1-S2 query ESM sinus tach in the 90s Abdomen soft no rebound no guarding Is able to straight leg raise She does have  rheumatoid deformity of the fingers with C90 She is quite frail   Data Reviewed: personally reviewed   CBC    Component Value Date/Time   WBC 8.6 04/05/2023 1039   RBC 3.95 04/05/2023 1039   HGB 11.5 (L) 04/05/2023 1039   HCT 35.2 (L) 04/05/2023 1039   PLT 362 04/05/2023 1039   MCV 89.1 04/05/2023 1039   MCH 29.1 04/05/2023 1039   MCHC 32.7 04/05/2023 1039   RDW 13.9 04/05/2023 1039   LYMPHSABS 2.1 04/05/2023 1039   MONOABS 1.0 04/05/2023 1039   EOSABS 0.2 04/05/2023 1039   BASOSABS 0.0 04/05/2023 1039      Latest Ref Rng & Units 04/05/2023   10:39 AM 12/11/2022   10:42 AM 12/20/2015    4:55 AM  CMP  Glucose 70 - 99 mg/dL 98  89  94   BUN 8 - 23 mg/dL 14  17  26    Creatinine 0.44 - 1.00 mg/dL 1.61  0.96  0.45   Sodium 135 - 145 mmol/L 136  137  138   Potassium 3.5 - 5.1 mmol/L 4.1  5.1  4.1   Chloride 98 - 111 mmol/L 105  101  109   CO2 22 - 32 mmol/L 21  26  20    Calcium 8.9 - 10.3 mg/dL 8.9  40.9  9.5   Total Protein 6.5 - 8.1 g/dL 7.3  8.1    Total Bilirubin 0.3 - 1.2 mg/dL 0.5  0.4    Alkaline Phos 38 - 126 U/L 53  49    AST 15 - 41 U/L 19  18    ALT 0 - 44 U/L 9  8       Radiology Studies: CT ANGIO HEAD NECK W WO CM  Result Date: 04/05/2023 CLINICAL DATA:  Severe pain in head and neck. Rule out dissection, bleed, skull fracture, hematoma. EXAM: CT ANGIOGRAPHY HEAD AND NECK WITH AND WITHOUT CONTRAST TECHNIQUE: Multidetector CT imaging of the head and neck was performed using the standard protocol during bolus administration of intravenous contrast. Multiplanar CT image reconstructions and MIPs were obtained to evaluate the vascular anatomy. Carotid stenosis measurements (when applicable) are obtained utilizing NASCET criteria, using the distal internal carotid diameter as the denominator. RADIATION DOSE REDUCTION: This exam was performed according to the departmental dose-optimization program which includes automated exposure control, adjustment of the mA and/or kV  according to patient size and/or use of iterative reconstruction technique. CONTRAST:  75mL OMNIPAQUE IOHEXOL 350 MG/ML SOLN COMPARISON:  CT head and cervical spine 08/01/2022 FINDINGS: CT HEAD FINDINGS Brain: There is no acute intracranial hemorrhage, extra-axial fluid collection, or acute infarct There is parenchymal volume loss with prominence of the ventricular system and extra-axial CSF spaces. Gray-white differentiation is preserved. Patchy hypodensity in the supratentorial white matter likely reflects sequela of chronic small-vessel ischemic change The pituitary and suprasellar region are normal. There is no mass lesion. There is no mass effect or midline shift. Vascular: See below. Skull: Normal. Negative for fracture or focal lesion. Sinuses/Orbits: There is complete opacification of the left maxillary sinus with hyperostosis consistent with chronic sinusitis. Bilateral lens implants are in place. The  globes and orbits are otherwise unremarkable. Other: None. Review of the MIP images confirms the above findings CTA NECK FINDINGS Aortic arch: The imaged aortic arch is normal. The origins of the major branch vessels are patent. The subclavian arteries are patent to the level imaged. Right carotid system: The right common, internal, and external carotid arteries are patent, without hemodynamically significant stenosis or occlusion there is no evidence of dissection or aneurysm. Left carotid system: The left common, internal, and external carotid arteries are patent, with minimal plaque of the bifurcation but no hemodynamically significant stenosis or occlusion. There is no evidence of dissection or aneurysm. Vertebral arteries: The vertebral arteries are patent, without hemodynamically significant stenosis or occlusion. There is no evidence of dissection or aneurysm. Skeleton: There is advanced degenerative change throughout the cervical spine with 6 mm anterolisthesis of C4 on C5, unchanged. There is severe  multilevel disc degeneration and degenerative endplate change with reversal of the normal curvature centered at C5. There is a nondisplaced fracture through the anterior C1 ring which is age-indeterminate but new since the CT cervical spine from 08/01/2022. The fracture margins appear distinct. There is no visible canal hematoma. There is no significant prevertebral soft tissue swelling. Other neck: The soft tissues of the neck are unremarkable. Upper chest: The esophagus is patulous and fluid-filled. The imaged lung apices are clear. Review of the MIP images confirms the above findings CTA HEAD FINDINGS Anterior circulation: There is mild calcified plaque in the intracranial ICAs without significant stenosis or occlusion. The bilateral MCAs are patent, without proximal stenosis or occlusion. The bilateral ACAs are patent, without proximal stenosis or occlusion. There is no aneurysm or AVM. Posterior circulation: The bilateral V4 segments are patent with mild plaque on the left but no significant stenosis or occlusion. The basilar artery is patent. The major cerebellar arteries appear patent. The bilateral PCAs are patent, without proximal stenosis or occlusion. Right larger than left posterior communicating arteries are identified. There is no aneurysm or AVM. Venous sinuses: Patent. Anatomic variants: None. Review of the MIP images confirms the above findings IMPRESSION: 1. No acute intracranial pathology. 2. Patent vasculature of the head and neck with no hemodynamically significant stenosis, occlusion, or dissection. 3. Nondisplaced fracture through the anterior C1 ring is age-indeterminate but new since the CT cervical spine from 08/01/2022. The fracture margins appear distinct raising suspicion for acute to subacute chronicity but there is no visible canal hematoma or significant prevertebral soft tissue swelling. 4. Chronic left maxillary sinusitis. 5. Patulous and fluid-filled esophagus. Correlate with any  history of reflux. Electronically Signed   By: Lesia Hausen M.D.   On: 04/05/2023 11:59     Scheduled Meds:  aspirin EC  81 mg Oral QHS   busPIRone  15 mg Oral BID   clopidogrel  75 mg Oral Daily   [START ON 04/07/2023] levothyroxine  50 mcg Oral QAC breakfast   meloxicam  15 mg Oral BH-q7a   metoprolol tartrate  25 mg Oral BID   pantoprazole  40 mg Oral Daily   [START ON 04/07/2023] venlafaxine XR  75 mg Oral Q breakfast   Continuous Infusions:   LOS: 0 days   Time spent: 31  Rhetta Mura, MD [facility] To contact the attending provider between 7A-7P or the covering provider during after hours 7P-7A, please log into the web site [email or web address] and access using universal [place] password for that web site. If you do not have the password, please call the hospital operator.  04/06/2023, 3:01 PM

## 2023-04-07 DIAGNOSIS — S12001A Unspecified nondisplaced fracture of first cervical vertebra, initial encounter for closed fracture: Secondary | ICD-10-CM | POA: Diagnosis not present

## 2023-04-07 MED ORDER — PROCHLORPERAZINE EDISYLATE 10 MG/2ML IJ SOLN
5.0000 mg | Freq: Four times a day (QID) | INTRAMUSCULAR | Status: DC | PRN
  Administered 2023-04-07 (×2): 5 mg via INTRAVENOUS
  Filled 2023-04-07 (×2): qty 2

## 2023-04-07 NOTE — Progress Notes (Signed)
Orthopedic Tech Progress Note Patient Details:  TEREA NEUBAUER 19-Aug-1941 [id number]  Order for a cervical collar has been called into [facility] as every in-house c-collar has been too large.   Patient ID: ZAYNE MAROVICH, female   DOB: 08-Nov-1941, 82 y.o.   MRN: [id number]  Docia Furl 04/07/2023, 7:09 PM

## 2023-04-07 NOTE — Consult Note (Signed)
WOC Nurse Consult Note: Reason for Consult:Unna's boot to RLE. Followed in the community by [facility] at [facility] by Dr. Temple Pacini. Last seen on 03/26/23.   Wound type:Full thickness, venous insufficiency Pressure Injury POA: N/A Measurement:Not measured today Wound bed:Per lat visit, red, granulating Drainage (amount, consistency, odor) moderate serous Periwound: intact with evidence of previous wound healing at last visit with Provider Dressing procedure/placement/frequency: I have provided Nursing with guidance for the care of this wound using a silver hydrofiber (the silver foam used in clinic is non formulary) topped with dry gauze/ABD pad and secured with Unna's boot. Changes are twice weekly on Mondays and Thursdays.  Ortho Tech to change/apply Unna's boots after Nursing removes, washes leg and performs wound care.  WOC nursing team will not follow, but will remain available to this patient, the nursing and medical teams.  Please re-consult if needed.  Thank you for inviting [place] to participate in this patient's Plan of Care.  Ladona Mow, MSN, RN, CNS, GNP, Leda Min, [employer], [employer] phone:  [phone]

## 2023-04-07 NOTE — Progress Notes (Signed)
PROGRESS NOTE   Rhonda Davenport  ZOX:[id number] DOB: 03/24/1941 DOA: 04/05/2023 PCP: Clemencia Course, PA-C  Brief Narrative:   82 year old home dwelling white female Known polypharmacy, prior CVA/TIA HFrEF EF 40-45% with CAD Prior right shoulder total arthroplasty Dr. Emmit Pomfret 2012 Rheumatoid arthritis followed by Dr. 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 Verlin Dike at [facility] regional supposed to get an MRI on 4/29 but because of severe pain came to [facility], ED-c-collar was placed at the recommendation of neurosurgery on-call  4/27-Discussed with Dr. Monia Pouch of neurosurgery and the patient should not need any acute surgery and should follow-up with her usual neurosurgeon Dr. Noel Gerold on discharge  Hospital-Problem based course  Neck pain with C1 ring fracture Age-indeterminate -new since prior CT spine 08/01/2022---Keep c-collar Pain seems moderately controlled--using Mobic 15 every morning,Tylenol for mild pain 325 every 6 as needed Keep on monitors for 24 hours to rule out occult arrhythmia-appreciative of PT input were recommending potentially home health as long as this can be arranged safely It appears family is searching for ALF for patient to go to-we will coordinate with social worker here to assist if we  can  Rheumatoid arthritis follows with Dr. Deanne Coffer Does not appear to be on any disease modifying therapy Continue Mobic 15 with meals  Prior TIAs/stroke-no records-continue Plavix 75, aspirin 81 and outpatient neurology follow-up  HFrEF EF 40%-cardiac cath nonobstructive CAD i 2018 follows with Dr. Marianna Fuss cardiology Aspirin Plavix as above Continue Lopressor 25 twice daily Previously on Lasix q. OD which has been held at this time and can be resumed in the outpatient setting  CKD 3 AA managed by renal  Anxiety continue BuSpar 15 twice daily, hydroxyzine 25 twice daily as needed itching/anxiety Resume Pristiq equivalent, effexor 75  Esophageal web/hiatal hernia with severe reflux recently Continue Protonix 40 daily  Right pretibial venous stasis ulcer Traumatic right knee wound secondary to fall Orders placed for PolyMem Ag padded dressing and Unna boot   DVT prophylaxis: SCD Code Status: Full Family Communication: Relatives at bedside Disposition:  Status is: Observation The patient will require care spanning > 2 midnights and should be moved to inpatient because:   Requires further workup  Patient is at relatively high risk of polypharmacy and will need outpatient nursing to manage her meds with a med planner  Subjective:  Present for evaluation by PT Patient was able to ambulate reasonably well and is min guard min assist No pain no nausea no vomiting Classifies pain as mild  Objective: Vitals:   04/07/23 0127 04/07/23 0446 04/07/23 0843 04/07/23 1223  BP: 117/69 132/75 94/60 109/67  Pulse: 80 (!) 105 65   Resp: 18 16 16 16   Temp: 98.3 F (36.8 C) 98.5 F (36.9 C) (!) 97.4 F (36.3 C) 97.6 F (36.4 C)  TempSrc: Oral Oral Axillary Axillary  SpO2: 98% 96% 97%  Weight:      Height:        Intake/Output Summary (Last 24 hours) at 04/07/2023 1418 Last data filed at 04/07/2023 0400 Gross per 24 hour  Intake 720 ml  Output --  Net 720 ml    Filed  Weights   04/05/23 0930  Weight: 40.8 kg    Examination:  Cachectic white female bitemporal supraclavicular wasting chest is clear no wheeze rales S1-S2 query ESM sinus tach  Gait is a little bit antalgic she has a stooped posture she is able to walk a in the hallway with rolling walker   Data Reviewed: personally reviewed   CBC    Component Value Date/Time   WBC 8.6 04/05/2023 1039   RBC 3.95 04/05/2023 1039   HGB 11.5 (L) 04/05/2023 1039   HCT 35.2 (L) 04/05/2023 1039   PLT 362 04/05/2023 1039   MCV 89.1 04/05/2023 1039   MCH 29.1 04/05/2023 1039   MCHC 32.7 04/05/2023 1039   RDW 13.9 04/05/2023 1039   LYMPHSABS 2.1 04/05/2023 1039   MONOABS 1.0 04/05/2023 1039   EOSABS 0.2 04/05/2023 1039   BASOSABS 0.0 04/05/2023 1039      Latest Ref Rng & Units 04/05/2023   10:39 AM 12/11/2022   10:42 AM 12/20/2015    4:55 AM  CMP  Glucose 70 - 99 mg/dL 98  89  94   BUN 8 - 23 mg/dL 14  17  26    Creatinine 0.44 - 1.00 mg/dL 1.61  0.96  0.45   Sodium 135 - 145 mmol/L 136  137  138   Potassium 3.5 - 5.1 mmol/L 4.1  5.1  4.1   Chloride 98 - 111 mmol/L 105  101  109   CO2 22 - 32 mmol/L 21  26  20    Calcium 8.9 - 10.3 mg/dL 8.9  40.9  9.5   Total Protein 6.5 - 8.1 g/dL 7.3  8.1    Total Bilirubin 0.3 - 1.2 mg/dL 0.5  0.4    Alkaline Phos 38 - 126 U/L 53  49    AST 15 - 41 U/L 19  18    ALT 0 - 44 U/L 9  8       Radiology Studies: No results found.   Scheduled Meds:  aspirin EC  81 mg Oral QHS   busPIRone  15 mg Oral BID   clopidogrel  75 mg Oral Daily   levothyroxine  50 mcg Oral QAC breakfast   meloxicam  15 mg Oral BH-q7a   metoprolol tartrate  25 mg Oral BID   pantoprazole  40 mg Oral Daily   venlafaxine XR  75 mg Oral Q breakfast   Continuous Infusions:   LOS: 1 day   Time spent: 30  Rhetta Mura, MD [facility] To contact the attending provider between 7A-7P or the covering provider during after hours 7P-7A, please log into the web site  [email or web address] and access using universal [place] password for that web site. If you do not have the password, please call the hospital operator.  04/07/2023, 2:18 PM

## 2023-04-07 NOTE — Evaluation (Addendum)
Physical Therapy Evaluation Patient Details Name: Rhonda Davenport MRN: [id number] DOB: [birth year] Today's Date: 04/07/2023  History of Present Illness  Pt is an 82 y.o. female who presented 04/05/23 with head and neck pain, reporting a fall x2 months and then another fall x1 week prior.  Imaging showed no acute intracranial pathology, patent vasculature of the head and neck with no hemodynamically significant stenosis, occlusion, or dissection, and age-indeterminate nondisplaced fracture through the anterior C1 ring. PMH: anemia, gait disorder, GERD, h/o blood clots, HTN, Still's disease, thyroid disease, RA, CHF, CKD, seizure   Clinical Impression  Pt presents with condition above and deficits mentioned below, see PT Problem List. PTA, she was mod I using a rollator, living alone in a 1-level house with a level entry. Family is currently discussing the amount of assistance they can provide upon d/c. Currently, pt is requiring min guard-minA for functional mobility, appearing to be primarily requiring minA intermittently due to her cervical restrictions and lightheadedness this date, which could be from her pain meds. Discussed how pain meds can also increase her risk for falls due to the side effects of dizziness on occasion. Pt displays deficits also in generalized strength, reporting baseline R ankle dorsiflexion weakness. Pt also reporting numbness in her R leg inferior to her knee where her bandage is, MD aware and reports it is likely due to her wound and wrapping. She is at risk for subsequent falls and could benefit from [facility] services and family support at d/c. Will continue to follow acutely.       Recommendations for follow up therapy are one component of a multi-disciplinary discharge planning process, led by the attending physician.  Recommendations may be updated based on patient status, additional functional criteria and insurance authorization.  Follow Up Recommendations       Assistance  Recommended at Discharge Frequent or constant Supervision/Assistance (initially)  Patient can return home with the following  A little help with walking and/or transfers;A little help with bathing/dressing/bathroom;Assistance with cooking/housework;Direct supervision/assist for financial management;Direct supervision/assist for medications management;Assist for transportation;Help with stairs or ramp for entrance    Equipment Recommendations BSC/3in1  Recommendations for Other Services       Functional Status Assessment Patient has had a recent decline in their functional status and demonstrates the ability to make significant improvements in function in a reasonable and predictable amount of time.     Precautions / Restrictions Precautions Precautions: Fall;Cervical Precaution Booklet Issued: No Precaution Comments: reviewed precautions Required Braces or Orthoses: Cervical Brace Cervical Brace: Other (comment);At all times (semi-hard; tried hard but did not fit well per chart) Restrictions Weight Bearing Restrictions: No      Mobility  Bed Mobility Overal bed mobility: Needs Assistance Bed Mobility: Rolling, Sidelying to Sit Rolling: Min guard Sidelying to sit: Min guard       General bed mobility comments: Pt needing cues to maintain spinal precautions and extra time to log roll and sit up R EOB, bed flat    Transfers Overall transfer level: Needs assistance Equipment used: Rolling walker (2 wheels) Transfers: Sit to/from Stand Sit to Stand: Min assist           General transfer comment: MinA for stability due to noted trunk sway with reported dizziness (denying spinning) when standing (could be due to pain meds).    Ambulation/Gait Ambulation/Gait assistance: Min assist, Min guard Gait Distance (Feet): 100 Feet Assistive device: Rolling walker (2 wheels) Gait Pattern/deviations: Step-to pattern, Decreased step length - left, Decreased  stance time - right,  Decreased stride length, Decreased dorsiflexion - right, Trunk flexed, Antalgic Gait velocity: reduced Gait velocity interpretation: <1.31 ft/sec, indicative of household ambulator   General Gait Details: Pt with trunk sway, intermittently needing minA for balance, could be due to pain meds. Otherwise, min guard assist for safety. Pt with slow, antalgic step-to gait pattern, likely due to pain in R leg where bandage is but pt denying pain.  Stairs            Wheelchair Mobility    Modified Rankin (Stroke Patients Only) Modified Rankin (Stroke Patients Only) Pre-Morbid Rankin Score: No symptoms Modified Rankin: Moderately severe disability     Balance Overall balance assessment: Needs assistance Sitting-balance support: No upper extremity supported, Feet supported Sitting balance-Leahy Scale: Fair     Standing balance support: Bilateral upper extremity supported, During functional activity, Reliant on assistive device for balance Standing balance-Leahy Scale: Poor Standing balance comment: Reliant on RW                             Pertinent Vitals/Pain Pain Assessment Pain Assessment: No/denies pain    Home Living Family/patient expects to be discharged to:: Private residence Living Arrangements: Alone Available Help at Discharge: Family;Friend(s);Available PRN/intermittently Type of Home: House Home Access: Level entry       Home Layout: One level Home Equipment: Agricultural consultant (2 wheels);Rollator (4 wheels);Shower seat;Grab bars - tub/shower;Grab bars - toilet      Prior Function Prior Level of Function : Independent/Modified Independent;Driving;History of Falls (last six months)             Mobility Comments: Mod I using rollator; recent falls ADLs Comments: was driving     Hand Dominance        Extremity/Trunk Assessment   Upper Extremity Assessment Upper Extremity Assessment: Defer to OT evaluation    Lower Extremity  Assessment Lower Extremity Assessment: Generalized weakness;RLE deficits/detail RLE Deficits / Details: reports some numbness inferior to knee on R, could be due to bandaging; Decreased dorsiflexion strength, MMT score of 3, but reports weakness at baseline RLE Sensation: decreased light touch    Cervical / Trunk Assessment Cervical / Trunk Assessment: Other exceptions (cervical fx)  Communication   Communication: No difficulties  Cognition Arousal/Alertness: Awake/alert Behavior During Therapy: Flat affect, Impulsive Overall Cognitive Status: Impaired/Different from baseline Area of Impairment: Awareness, Safety/judgement, Following commands, Attention                   Current Attention Level: Selective   Following Commands: Follows one step commands consistently, Follows one step commands with increased time, Follows multi-step commands inconsistently Safety/Judgement: Decreased awareness of safety, Decreased awareness of deficits Awareness: Emergent   General Comments: Pt can be mildly impulsive to not hold RW or try to step even though just cued to wait to ensure her dizziness subsided prior to ambulating, decreased safety awareness        General Comments General comments (skin integrity, edema, etc.): encouraged family to discuss providing the support pt needs at d/c    Exercises     Assessment/Plan    PT Assessment Patient needs continued PT services  PT Problem List Decreased strength;Decreased activity tolerance;Decreased balance;Decreased mobility;Decreased coordination;Decreased cognition;Decreased safety awareness;Decreased knowledge of precautions       PT Treatment Interventions DME instruction;Gait training;Functional mobility training;Therapeutic activities;Therapeutic exercise;Balance training;Neuromuscular re-education;Cognitive remediation;Patient/family education    PT Goals (Current goals can be found in the Care  Plan section)  Acute Rehab PT  Goals Patient Stated Goal: to go home PT Goal Formulation: With patient/family Time For Goal Achievement: 04/21/23 Potential to Achieve Goals: Good    Frequency Min 4X/week     Co-evaluation               AM-PAC PT "6 Clicks" Mobility  Outcome Measure Help needed turning from your back to your side while in a flat bed without using bedrails?: A Little Help needed moving from lying on your back to sitting on the side of a flat bed without using bedrails?: A Little Help needed moving to and from a bed to a chair (including a wheelchair)?: A Little Help needed standing up from a chair using your arms (e.g., wheelchair or bedside chair)?: A Little Help needed to walk in hospital room?: A Little Help needed climbing 3-5 steps with a railing? : A Little 6 Click Score: 18    End of Session Equipment Utilized During Treatment: Gait belt;Cervical collar Activity Tolerance: Patient tolerated treatment well Patient left: in chair;with call bell/phone within reach;with chair alarm set;with family/visitor present Nurse Communication: Mobility status PT Visit Diagnosis: Unsteadiness on feet (R26.81);Other abnormalities of gait and mobility (R26.89);Muscle weakness (generalized) (M62.81);History of falling (Z91.81);Repeated falls (R29.6);Difficulty in walking, not elsewhere classified (R26.2)    Time: 1610-9604 PT Time Calculation (min) (ACUTE ONLY): 34 min   Charges:   PT Evaluation $PT Eval Moderate Complexity: 1 Mod PT Treatments $Therapeutic Activity: 8-22 mins        Raymond Gurney, PT, DPT Acute Rehabilitation Services  Office: [phone]   Jewel Baize 04/07/2023, 12:57 PM

## 2023-04-08 DIAGNOSIS — S12001A Unspecified nondisplaced fracture of first cervical vertebra, initial encounter for closed fracture: Secondary | ICD-10-CM | POA: Diagnosis not present

## 2023-04-08 MED ORDER — METOPROLOL TARTRATE 25 MG PO TABS
37.5000 mg | ORAL_TABLET | Freq: Two times a day (BID) | ORAL | Status: DC
  Administered 2023-04-08: 37.5 mg via ORAL
  Filled 2023-04-08: qty 1

## 2023-04-08 NOTE — Progress Notes (Addendum)
PROGRESS NOTE   Rhonda Davenport  WUJ:[id number] DOB: 1941/06/22 DOA: 04/05/2023 PCP: Clemencia Course, PA-C  Brief Narrative:   82 year old home dwelling white female Known polypharmacy, prior CVA/TIA HFrEF EF 40-45% with CAD Prior right shoulder total arthroplasty Dr. Patsy Lager 2012 Rheumatoid arthritis followed by Dr. Deanne Coffer locally Adult onset stills disease CKD 4 Hypothyroidism Chronic cerebral ischemia + hemispheric carotid artery syndrome Hospitalization [facility] 2/13 through 2/15 with pneumonia ill-defined opacity left base and treated appropriately. Right pretibial venous stasis ulcer by [facility] c nonadherent dressing and follows with Trena Platt of the wound care center Known benign-appearing GE junction web not allowing 13 mm tablet of barium to pass along with moderate sliding hiatal hernia and severe reflux managed by Marliss Coots with associated weight loss over the past year year and a half  She fell about 1 week ago and hit her head took oxycodone without relief and saw Dr. Noel Gerold [neurosurgery] at [facility] supposed to get an MRI on 4/29 but because of severe pain came to [facility], ED-c-collar was placed at the recommendation of neurosurgery on-call  4/27-Discussed with Dr. Monia Pouch of neurosurgery and the patient should not need any acute surgery and should follow-up with her usual neurosurgeon Dr. Noel Gerold on discharge  Hospital-Problem based course  Neck pain with C1 ring fracture Age-indeterminate -new since prior CT spine 08/01/2022---Keep c-collar Pain seems moderately controlled--using Mobic 15 every morning,Tylenol for mild pain 325 every 6 as needed May use Dilaudid for very severe pain would try to avoid on discharge  SVT about 25-30 beats 4/20 9 AM Check magnesium with next lab draws Adjust Lopressor to 37.5 twice daily and if recurrence will need input from cardiology and hospital  HFrEF EF 40%-cardiac cath  nonobstructive CAD i 2018 follows with Dr. Marianna Fuss cardiology Aspirin Plavix as above--see above discussion Previously on Lasix q. OD which has been held at this time and can be resumed in the outpatient setting  Rheumatoid arthritis follows with Dr. Deanne Coffer Does not appear to be on any disease modifying therapy Continue Mobic 15 with meals  Prior TIAs/stroke-no records-continue Plavix 75, aspirin 81 and outpatient neurology follow-up  Chronic underlying dysphagia with GE web followed by Dr. Marliss Coots Right now on dysphagia 3 diet thin liquids and can follow-up with her usual gastroenterologist in the outpatient setting  CKD 3 AA managed by renal  Anxiety continue BuSpar 15 twice daily, hydroxyzine 25 twice daily as needed itching/anxiety Resume Pristiq equivalent, effexor 75 Is at pretty high risk for polypharmacy will need close attention to any psychotropic medications and care with opiate pain meds as she was dosing most of her meds by herself  Esophageal web/hiatal hernia with severe reflux recently Continue Protonix 40 daily  Right pretibial venous stasis ulcer Traumatic right knee wound secondary to fall Orders placed for PolyMem Ag padded dressing and Unna boot Wound examined on 4/29    DVT prophylaxis: SCD Code Status: Full Family Communication: Relatives at bedside Disposition:  Status is: Observation The patient will require care spanning > 2 midnights and should be moved to inpatient because:   Need to observe overnight to ensure no recurrence of SVT  Subjective:  Doing fair no distress got the right [place] collar and feels much more comfortable sitting in the chair walked with therapy Note SVT overnight which we are adjusting meds for No chest pain although lower back is somewhat painful  Objective: Vitals:   04/07/23 1223 04/07/23 1951  04/08/23 0632 04/08/23 0741  BP: 109/67 (!) 138/92 128/62 (!) 148/80  Pulse:  82  78  Resp: 16 16 18 20   Temp: 97.6  F (36.4 C) 98.7 F (37.1 C) 98.2 F (36.8 C) 98.2 F (36.8 C)  TempSrc: Axillary Axillary Oral Oral  SpO2:  97% 96% 100%  Weight:      Height:        Intake/Output Summary (Last 24 hours) at 04/08/2023 1252 Last data filed at 04/08/2023 0548 Gross per 24 hour  Intake --  Output 200 ml  Net -200 ml    Filed Weights   04/05/23 0930  Weight: 40.8 kg    Examination:  Cachectic white female bitemporal supraclavicular wasting  chest is clear no wheeze rales S1-S2 query ESM --SVT about 25-30 beats Abdomen is soft no rebound ROM is intact Neuro is intact-I did not assess her gait today   Data Reviewed: personally reviewed   CBC    Component Value Date/Time   WBC 8.6 04/05/2023 1039   RBC 3.95 04/05/2023 1039   HGB 11.5 (L) 04/05/2023 1039   HCT 35.2 (L) 04/05/2023 1039   PLT 362 04/05/2023 1039   MCV 89.1 04/05/2023 1039   MCH 29.1 04/05/2023 1039   MCHC 32.7 04/05/2023 1039   RDW 13.9 04/05/2023 1039   LYMPHSABS 2.1 04/05/2023 1039   MONOABS 1.0 04/05/2023 1039   EOSABS 0.2 04/05/2023 1039   BASOSABS 0.0 04/05/2023 1039      Latest Ref Rng & Units 04/05/2023   10:39 AM 12/11/2022   10:42 AM 12/20/2015    4:55 AM  CMP  Glucose 70 - 99 mg/dL 98  89  94   BUN 8 - 23 mg/dL 14  17  26    Creatinine 0.44 - 1.00 mg/dL 4.09  8.11  9.14   Sodium 135 - 145 mmol/L 136  137  138   Potassium 3.5 - 5.1 mmol/L 4.1  5.1  4.1   Chloride 98 - 111 mmol/L 105  101  109   CO2 22 - 32 mmol/L 21  26  20    Calcium 8.9 - 10.3 mg/dL 8.9  78.2  9.5   Total Protein 6.5 - 8.1 g/dL 7.3  8.1    Total Bilirubin 0.3 - 1.2 mg/dL 0.5  0.4    Alkaline Phos 38 - 126 U/L 53  49    AST 15 - 41 U/L 19  18    ALT 0 - 44 U/L 9  8       Radiology Studies: No results found.   Scheduled Meds:  aspirin EC  81 mg Oral QHS   busPIRone  15 mg Oral BID   clopidogrel  75 mg Oral Daily   levothyroxine  50 mcg Oral QAC breakfast   meloxicam  15 mg Oral BH-q7a   metoprolol tartrate  25 mg Oral BID    pantoprazole  40 mg Oral Daily   venlafaxine XR  75 mg Oral Q breakfast   Continuous Infusions:   LOS: 2 days   Time spent: 20  Rhetta Mura, MD [facility] To contact the attending provider between 7A-7P or the covering provider during after hours 7P-7A, please log into the web site [email or web address] and access using universal [place] password for that web site. If you do not have the password, please call the hospital operator.  04/08/2023, 12:52 PM

## 2023-04-08 NOTE — Progress Notes (Signed)
Occupational Therapy Treatment Patient Details Name: Rhonda Davenport MRN: [id number] DOB: [birth year] Today's Date: 04/08/2023   History of present illness Pt is an 82 y.o. female who presented 04/05/23 with head and neck pain, reporting a fall x2 months and then another fall x1 week prior.  Imaging showed no acute intracranial pathology, patent vasculature of the head and neck with no hemodynamically significant stenosis, occlusion, or dissection, and age-indeterminate nondisplaced fracture through the anterior C1 ring. PMH: anemia, gait disorder, GERD, h/o blood clots, HTN, Still's disease, thyroid disease, RA, CHF, CKD, seizure   OT comments  This 82 yo female seen for a second time today once the pediatric cervical collar arrived. The fit is so much better than the small adult [place] collar she had one. Instructions given to pt and dtr-in-law below in ADL section. We will continue to follow.   Recommendations for follow up therapy are one component of a multi-disciplinary discharge planning process, led by the attending physician.  Recommendations may be updated based on patient status, additional functional criteria and insurance authorization.    Assistance Recommended at Discharge Frequent or constant Supervision/Assistance  Patient can return home with the following  A little help with walking and/or transfers;A lot of help with bathing/dressing/bathroom;Assistance with cooking/housework;Assistance with feeding;Help with stairs or ramp for entrance;Assist for transportation;Direct supervision/assist for financial management;Direct supervision/assist for medications management   Equipment Recommendations  None recommended by OT       Precautions / Restrictions Precautions Precautions: Fall;Cervical Precaution Booklet Issued: No Precaution Comments: reviewed precautions Required Braces or Orthoses: Cervical Brace Cervical Brace: Other (comment);At all times Restrictions Weight  Bearing Restrictions: No              ADL either performed or assessed with clinical judgement   ADL      General ADL Comments: Pt seen for fit of pediatric cervical collar which fits her much better than small adult [place] collar. Dtr-in-law in room so explained to pt and dtr-in-law wearing pediatric collar all the time except for showering (wear [place] collar). Also recommended to pt she sleep on back, left, or right side (alternating so she does not develop a sore on the back of her head. Explained how she needed to protect collar when eating and mouth care by placing a washcloth or cotton napkin between her chin and the collar to keep pads clean; as well as to use straws to drink out of. All of this was written on post op cervical surgery handout (which also applies to anyone wearing a cervical collar).                       Pertinent Vitals/ Pain       Pain Assessment Pain Assessment: 0-10 Pain Score: 7  Pain Descriptors / Indicators: Aching, Sore Pain Intervention(s): Limited activity within patient's tolerance, Monitored during session, Repositioned         Frequency  Min 2X/week        Progress Toward Goals  OT Goals(current goals can now be found in the care plan section)  Progress towards OT goals: Progressing toward goals  Acute Rehab OT Goals Patient Stated Goal: agreeable to rehab since her help at home is limited OT Goal Formulation: With patient Time For Goal Achievement: 04/22/23 Potential to Achieve Goals: Good  Plan Discharge plan needs to be updated       AM-PAC OT "6 Clicks" Daily Activity     Outcome Measure   Help  from another person eating meals?: A Little Help from another person taking care of personal grooming?: A Little Help from another person toileting, which includes using toliet, bedpan, or urinal?: A Little Help from another person bathing (including washing, rinsing, drying)?: A Lot Help from another person to  put on and taking off regular upper body clothing?: A Lot Help from another person to put on and taking off regular lower body clothing?: A Lot 6 Click Score: 15    End of Session Equipment Utilized During Treatment: Gait belt;Rolling walker (2 wheels)  OT Visit Diagnosis: Unsteadiness on feet (R26.81);Other abnormalities of gait and mobility (R26.89);Muscle weakness (generalized) (M62.81);Pain Pain - part of body:  (neck)   Activity Tolerance Patient tolerated treatment well   Patient Left in chair;with call bell/phone within reach;with chair alarm set;with family/visitor present           Time: 0981-1914 OT Time Calculation (min): 19 min  Charges: OT General Charges $OT Visit: 1 Visit OT Evaluation $OT Eval Moderate Complexity: 1 Mod OT Treatments $Self Care/Home Management : 8-22 mins  Lindon Romp OT Acute Rehabilitation Services Office [phone]    Evette Georges 04/08/2023, 10:27 AM

## 2023-04-08 NOTE — TOC Progression Note (Signed)
Transition of Care [facility]) - Progression Note    Patient Details  Name: Rhonda Davenport MRN: [id number] Date of Birth: 31-May-1941  Transition of [facility]) CM/SW Contact  Erin Sons, [place] Phone Number: 04/08/2023, 1:24 PM  Clinical Narrative:     CSW notified that PT/OT rec is now for SNF. Met with pt bedside. Pt's family member Suzanna's mother is bedside and calls Suzanna to be included in discussion on speaker phone. Pt is agreeable to SNF. She has been to [facility] in the past. Currently, first choice would be [facility] which family has been in contact with already. CSW discussed need for qualified 3-midnight stay for medicare coverage vs private pay. CSW completed fl2 and faxed bed requests in hub.   MD confirmed pt would still be admitted for another night. She will have a qualified 3 midnight stay.   1320: CSW confirmed with [facility] they can accept pt under her medicare when ready. CSW updated Suzanna.   Expected Discharge Plan: Skilled Nursing Facility Barriers to Discharge: Continued Medical Work up  Expected Discharge Plan and Services                                               Social Determinants of Health (SDOH) Interventions SDOH Screenings   Tobacco Use: Medium Risk (04/05/2023)    Readmission Risk Interventions     No data to display

## 2023-04-08 NOTE — Progress Notes (Addendum)
Physical Therapy Treatment Patient Details Name: Rhonda Davenport MRN: [id number] DOB: 04-22-1941 Today's Date: 04/08/2023   History of Present Illness Pt is an 82 y.o. female who presented 04/05/23 with head and neck pain, reporting a fall x2 months and then another fall x1 week prior.  Imaging showed no acute intracranial pathology, patent vasculature of the head and neck with no hemodynamically significant stenosis, occlusion, or dissection, and age-indeterminate nondisplaced fracture through the anterior C1 ring. PMH: anemia, gait disorder, GERD, h/o blood clots, HTN, Still's disease, thyroid disease, RA, CHF, CKD, seizure    PT Comments    Pt maintaining level of functional mobility. She is drowsy, sitting up in the chair upon arrival, but easily aroused. Pt demonstrates impulsivity during session, standing up before gait belt is donned and attempting to step away from the chair. Pt ambulating 90 ft with a walker at a min guard assist level. Demonstrates right lateral lean and drift, with decreased retropulsion this session. Increased time for obstacle negotiation and cues for environmental navigation. Gait speed of 0.82 ft/s, history of recurrent falls, and decreased safety awareness contribute to increased overall fall risk. OT present at end of session with new cervical brace for optimal fit. Discussed with pt and pt family, who feel that pt boyfriend cannot provide level of physical assist and guarding that pt may need to transition home. In light of deficits and decreased caregiver assist available, patient will benefit from continued inpatient follow up therapy, <3 hours/day    Recommendations for follow up therapy are one component of a multi-disciplinary discharge planning process, led by the attending physician.  Recommendations may be updated based on patient status, additional functional criteria and insurance authorization.  Follow Up Recommendations       Assistance Recommended at  Discharge Frequent or constant Supervision/Assistance  Patient can return home with the following A little help with walking and/or transfers;A little help with bathing/dressing/bathroom;Assistance with cooking/housework;Direct supervision/assist for financial management;Direct supervision/assist for medications management;Assist for transportation;Help with stairs or ramp for entrance   Equipment Recommendations  BSC/3in1    Recommendations for Other Services       Precautions / Restrictions Precautions Precautions: Fall;Cervical Precaution Booklet Issued: No Precaution Comments: reviewed precautions Required Braces or Orthoses: Cervical Brace Cervical Brace: Other (comment);At all times Restrictions Weight Bearing Restrictions: No     Mobility  Bed Mobility               General bed mobility comments: OOB in chair    Transfers Overall transfer level: Needs assistance Equipment used: Rolling walker (2 wheels) Transfers: Sit to/from Stand Sit to Stand: Min guard           General transfer comment: Min guard for safety. Cues for recalling to reach back when transitioning to sitting    Ambulation/Gait Ambulation/Gait assistance: Min guard Gait Distance (Feet): 90 Feet Assistive device: Rolling walker (2 wheels) Gait Pattern/deviations: Step-to pattern, Decreased step length - left, Decreased stance time - right, Decreased stride length, Decreased dorsiflexion - right, Trunk flexed, Antalgic Gait velocity: 0.82 ft/s Gait velocity interpretation: <1.31 ft/sec, indicative of household ambulator   General Gait Details: Min guard overall for safety, right lateral lean and tendency for drift, increased time to negotiate obstacles. Cueing for picking up feet with turns   Stairs             Wheelchair Mobility    Modified Rankin (Stroke Patients Only) Modified Rankin (Stroke Patients Only) Pre-Morbid Rankin Score: No symptoms Modified Rankin:  Moderately  severe disability     Balance Overall balance assessment: Needs assistance Sitting-balance support: No upper extremity supported, Feet supported Sitting balance-Leahy Scale: Fair     Standing balance support: Bilateral upper extremity supported, During functional activity, Reliant on assistive device for balance Standing balance-Leahy Scale: Poor Standing balance comment: Reliant on RW                            Cognition Arousal/Alertness: Awake/alert Behavior During Therapy: Flat affect, Impulsive Overall Cognitive Status: Impaired/Different from baseline Area of Impairment: Awareness, Safety/judgement, Following commands, Attention                   Current Attention Level: Selective   Following Commands: Follows one step commands consistently, Follows one step commands with increased time, Follows multi-step commands inconsistently Safety/Judgement: Decreased awareness of safety, Decreased awareness of deficits Awareness: Emergent   General Comments: Pt can be mildly impulsive,decreased safety awareness        Exercises      General Comments        Pertinent Vitals/Pain Pain Assessment Pain Assessment: Faces Faces Pain Scale: No hurt    Home Living Family/patient expects to be discharged to:: Private residence Living Arrangements: Alone Available Help at Discharge: Family;Friend(s);Available PRN/intermittently Type of Home: House Home Access: Level entry       Home Layout: One level Home Equipment: Agricultural consultant (2 wheels);Rollator (4 wheels);Shower seat;Grab bars - tub/shower;Grab bars - toilet      Prior Function            PT Goals (current goals can now be found in the care plan section) Acute Rehab PT Goals Patient Stated Goal: to go home PT Goal Formulation: With patient/family Time For Goal Achievement: 04/21/23 Potential to Achieve Goals: Good Progress towards PT goals: Progressing toward goals    Frequency    Min  3X/week      PT Plan Discharge plan needs to be updated;Frequency needs to be updated    Co-evaluation              AM-PAC PT "6 Clicks" Mobility   Outcome Measure  Help needed turning from your back to your side while in a flat bed without using bedrails?: A Little Help needed moving from lying on your back to sitting on the side of a flat bed without using bedrails?: A Little Help needed moving to and from a bed to a chair (including a wheelchair)?: A Little Help needed standing up from a chair using your arms (e.g., wheelchair or bedside chair)?: A Little Help needed to walk in hospital room?: A Little Help needed climbing 3-5 steps with a railing? : A Little 6 Click Score: 18    End of Session Equipment Utilized During Treatment: Gait belt;Cervical collar Activity Tolerance: Patient tolerated treatment well Patient left: in chair;with call bell/phone within reach;with chair alarm set;with family/visitor present Nurse Communication: Mobility status PT Visit Diagnosis: Unsteadiness on feet (R26.81);Other abnormalities of gait and mobility (R26.89);Muscle weakness (generalized) (M62.81);History of falling (Z91.81);Repeated falls (R29.6);Difficulty in walking, not elsewhere classified (R26.2)     Time: 1308-6578 PT Time Calculation (min) (ACUTE ONLY): 30 min  Charges:  $Gait Training: 8-22 mins $Therapeutic Activity: 8-22 mins                     Lillia Pauls, PT, DPT Acute Rehabilitation Services Office [phone]    Norval Morton 04/08/2023, 10:14 AM

## 2023-04-08 NOTE — Evaluation (Signed)
Occupational Therapy Evaluation Patient Details Name: Rhonda Davenport MRN: [id number] DOB: [birth year] Today's Date: 04/08/2023   History of Present Illness Pt is an 82 y.o. female who presented 04/05/23 with head and neck pain, reporting a fall x2 months and then another fall x1 week prior.  Imaging showed no acute intracranial pathology, patent vasculature of the head and neck with no hemodynamically significant stenosis, occlusion, or dissection, and age-indeterminate nondisplaced fracture through the anterior C1 ring. PMH: anemia, gait disorder, GERD, h/o blood clots, HTN, Still's disease, thyroid disease, RA, CHF, CKD, seizure   Clinical Impression   This 82 yo female admitted with above presents to acute OT with PLOF of being Mod I with all basic ADLs, IADLs, and driving until recent falls. Currently she is setup/S-Mod A for basic ADLs and min guard A with all mobility at a RW level. She will continue to benefit form acute OT with follow up HHOT unless does not have 24/7 S/prn A initially (until family feels they are safe to leave her alone). If not then patient will benefit from continued inpatient follow up therapy, <3 hours/day.      Recommendations for follow up therapy are one component of a multi-disciplinary discharge planning process, led by the attending physician.  Recommendations may be updated based on patient status, additional functional criteria and insurance authorization.   Assistance Recommended at Discharge Frequent or constant Supervision/Assistance  Patient can return home with the following A little help with walking and/or transfers;A lot of help with bathing/dressing/bathroom;Assistance with cooking/housework;Assistance with feeding;Help with stairs or ramp for entrance;Assist for transportation;Direct supervision/assist for financial management;Direct supervision/assist for medications management    Functional Status Assessment  Patient has had a recent decline in  their functional status and demonstrates the ability to make significant improvements in function in a reasonable and predictable amount of time.  Equipment Recommendations  None recommended by OT       Precautions / Restrictions Precautions Precautions: Fall;Cervical Precaution Booklet Issued: No Required Braces or Orthoses: Cervical Brace Cervical Brace: At all times;Hard collar ([place] -small (have asked for a pediatric collar be sent up from materials management)) Restrictions Weight Bearing Restrictions: No      Mobility Bed Mobility Overal bed mobility: Needs Assistance Bed Mobility: Supine to Sit   Sidelying to sit: Min guard, HOB elevated            Transfers Overall transfer level: Needs assistance Equipment used: Rolling walker (2 wheels) Transfers: Sit to/from Stand Sit to Stand: Min guard           General transfer comment: VCs for safe hand placement for sit<>stand      Balance Overall balance assessment: Needs assistance Sitting-balance support: No upper extremity supported, Feet supported Sitting balance-Leahy Scale: Good     Standing balance support: Bilateral upper extremity supported, Reliant on assistive device for balance Standing balance-Leahy Scale: Poor Standing balance comment: Reliant on RW                           ADL either performed or assessed with clinical judgement   ADL Overall ADL's : Needs assistance/impaired Eating/Feeding: Set up;Sitting   Grooming: Min guard;Standing   Upper Body Bathing: Set up;Supervision/ safety;Sitting   Lower Body Bathing: Minimal assistance Lower Body Bathing Details (indicate cue type and reason): min guard A sit<>stand Upper Body Dressing : Moderate assistance;Sitting   Lower Body Dressing: Moderate assistance Lower Body Dressing Details (indicate cue type and  reason): min guard A sit<>stand Toilet Transfer: Min guard;Ambulation;Rolling walker (2 wheels) Toilet Transfer  Details (indicate cue type and reason): simulated bed>around to recliner Toileting- Clothing Manipulation and Hygiene: Minimal assistance Toileting - Clothing Manipulation Details (indicate cue type and reason): min guard A sit<>stand             Vision Baseline Vision/History: 1 Wears glasses Patient Visual Report: No change from baseline              Pertinent Vitals/Pain Pain Assessment Pain Assessment: 0-10 Pain Score: 7  Pain Descriptors / Indicators: Aching, Sore Pain Intervention(s): Limited activity within patient's tolerance, Monitored during session, Repositioned     Hand Dominance Right (RA both hands)   Extremity/Trunk Assessment Upper Extremity Assessment Upper Extremity Assessment: RUE deficits/detail;LUE deficits/detail RUE Deficits / Details: RA both arms, wrist especially this arm RUE Sensation: decreased proprioception RUE Coordination: decreased fine motor LUE Deficits / Details: RA both arms, shoulder especially in this arm LUE Sensation: decreased proprioception LUE Coordination: decreased gross motor           Communication Communication Communication: No difficulties   Cognition Arousal/Alertness: Awake/alert Behavior During Therapy: Flat affect Overall Cognitive Status: Impaired/Different from baseline Area of Impairment: Safety/judgement, Awareness, Following commands                       Following Commands: Follows one step commands consistently Safety/Judgement: Decreased awareness of safety Awareness: Emergent   General Comments: needs increased instruction on safe hand placement for sit<>stand from walker                Home Living Family/patient expects to be discharged to:: Private residence Living Arrangements: Alone Available Help at Discharge: Family;Friend(s);Available PRN/intermittently Type of Home: House Home Access: Level entry     Home Layout: One level     Bathroom Shower/Tub: Company secretary: Standard     Home Equipment: Agricultural consultant (2 wheels);Rollator (4 wheels);Shower seat;Grab bars - tub/shower;Grab bars - toilet          Prior Functioning/Environment Prior Level of Function : Independent/Modified Independent;Driving;History of Falls (last six months)             Mobility Comments: Mod I using rollator; recent falls ADLs Comments: was driving        OT Problem List: Decreased strength;Decreased range of motion;Impaired balance (sitting and/or standing);Pain      OT Treatment/Interventions: Self-care/ADL training;DME and/or AE instruction;Patient/family education;Balance training    OT Goals(Current goals can be found in the care plan section) Acute Rehab OT Goals Patient Stated Goal: to go home today OT Goal Formulation: With patient Time For Goal Achievement: 04/22/23 Potential to Achieve Goals: Good  OT Frequency: Min 2X/week       AM-PAC OT "6 Clicks" Daily Activity     Outcome Measure Help from another person eating meals?: A Little Help from another person taking care of personal grooming?: A Little Help from another person toileting, which includes using toliet, bedpan, or urinal?: A Little Help from another person bathing (including washing, rinsing, drying)?: A Lot Help from another person to put on and taking off regular upper body clothing?: A Lot Help from another person to put on and taking off regular lower body clothing?: A Lot 6 Click Score: 15   End of Session Equipment Utilized During Treatment: Gait belt;Rolling walker (2 wheels)  Activity Tolerance: Patient tolerated treatment well Patient left: in chair;with call bell/phone within reach;with  chair alarm set  OT Visit Diagnosis: Unsteadiness on feet (R26.81);Other abnormalities of gait and mobility (R26.89);Muscle weakness (generalized) (M62.81);Pain Pain - part of body:  (neck)                Time: 1610-9604 OT Time Calculation (min): 38 min Charges:  OT  General Charges $OT Visit: 1 Visit OT Evaluation $OT Eval Moderate Complexity: 1 Mod OT Treatments $Self Care/Home Management : 23-37 mins  Lindon Romp OT Acute Rehabilitation Services Office [phone]    Evette Georges 04/08/2023, 8:58 AM

## 2023-04-08 NOTE — Progress Notes (Signed)
Orthopedic Tech Progress Note Patient Details:  Rhonda Davenport [birth year] [id number]  Ortho Devices Type of Ortho Device: Ace wrap, Unna boot Ortho Device/Splint Location: RLE Ortho Device/Splint Interventions: Ordered, Application, Adjustment   Post Interventions Patient Tolerated: Well Instructions Provided: Care of device  [name] 04/08/2023, 3:12 PM

## 2023-04-08 NOTE — NC FL2 (Signed)
[place] MEDICAID FL2 LEVEL OF CARE FORM     IDENTIFICATION  Patient Name: Rhonda Davenport Birthdate: [birth year] Sex: female Admission Date (Current Location): 04/05/2023  [facility] and [place] Number:  Producer, television/film/video and Address:  [facility], [address], [place], [place] [address]      Provider Number: [id number]  Attending Physician Name and Address:  Rhetta Mura, MD  Relative Name and Phone Number:       Current Level of Care: Hospital Recommended Level of Care: Skilled Nursing Facility Prior Approval Number:    Date Approved/Denied:   PASRR Number: [id number]  Discharge Plan: SNF    Current Diagnoses: Patient Active Problem List   Diagnosis Date Noted   C1 cervical fracture (HCC) 04/05/2023   Neck pain 04/05/2023   Acute cystitis without hematuria 03/03/2018   Sciatica of right side 03/03/2018   Vitamin B12 deficiency 02/13/2018   Pleural effusion, left 06/19/2017   Abnormal EKG 06/16/2017   Chronic kidney disease (CKD) stage G3b/A3, moderately decreased glomerular filtration rate (GFR) between 30-44 mL/min/1.73 square meter and albuminuria creatinine ratio greater than 300 mg/g (HCC) 05/13/2017   Grief reaction 02/21/2017   Diverticulitis of large intestine without perforation or abscess without bleeding 07/18/2016   LLQ abdominal pain 05/21/2016   Eczema 05/07/2016   Diaphoresis 03/28/2016   Orthostatic hypotension 03/22/2016   Anxiety 03/15/2016   Chronic cerebral ischemia 03/15/2016   Cystocele 03/15/2016   Osteoarthritis 03/15/2016   Macular degeneration 03/15/2016   Senile osteoporosis 03/15/2016   Thrombocytosis 03/15/2016   Vitamin D deficiency 03/15/2016   Adjustment disorder with anxious mood 03/14/2016   GERD (gastroesophageal reflux disease) 03/14/2016   Nausea 03/14/2016   Primary insomnia 02/29/2016   Drug-induced confusion, acute 12/24/2015   Overuse of medication 12/24/2015   Adjustment and  management of cardiac pacemaker 12/24/2015   Contusion    Acute renal failure (ARF) (HCC) 12/19/2015   Altered mental status 12/19/2015   Lethargy 12/15/2015   Ataxia 10/27/2015   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   Frequent falls 10/27/2015   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   Major depressive disorder, single episode 10/06/2015   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   Iron deficiency anemia 06/22/2015   Chronic systolic heart failure (HCC) 06/21/2015   Localized edema 06/21/2015   Essential hypertension 05/30/2015   Malaise and fatigue 05/30/2015   Lumbar spondylolysis 02/07/2015   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   Leg weakness, bilateral 01/05/2015   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 08/02/2014   Lumbar degenerative disc disease 06/07/2014   Lumbar radiculopathy 06/07/2014   Elevated blood pressure 05/06/2014   Rheumatoid arthritis involving multiple joints (HCC) 05/06/2014   Seizure (HCC) 03/24/2014   Altered mental state 03/24/2014   Generalized anxiety disorder 02/15/2014   Hypothyroidism 05/16/2013   Hyperlipidemia 05/16/2013   Cough 12/25/2012    Orientation RESPIRATION BLADDER Height & Weight     Self, Time, Situation, Place  Normal External catheter Weight: 89 lb 15.2 oz (40.8 kg) Height:  4\' 7"  (139.7 cm)  BEHAVIORAL SYMPTOMS/MOOD NEUROLOGICAL BOWEL NUTRITION STATUS      Continent Diet (see d/c summary)  AMBULATORY STATUS COMMUNICATION OF NEEDS Skin   Extensive Assist Verbally Other (Comment) (pretibial wound)                       Personal Care Assistance Level of Assistance  Bathing, Feeding, Dressing Bathing Assistance: Limited assistance Feeding assistance: Independent Dressing Assistance: Limited  assistance     Functional Limitations Info  Sight, Hearing, Speech Sight Info: Impaired Hearing Info: Impaired Speech Info: Adequate    SPECIAL CARE FACTORS FREQUENCY  PT (By licensed PT), OT (By  licensed OT)     PT Frequency: 5x/week OT Frequency: 5x/week            Contractures Contractures Info: Not present    Additional Factors Info  Code Status, Allergies Code Status Info: full code Allergies Info: Atorvastatin, oxycodone, ibuprofen, tramadol, Ceftriaxone, codeine, diphenahydramine, Benztropine, meloxicam, Hydrocodone-acetaminophen, Leflunomide           Current Medications (04/08/2023):  This is the current hospital active medication list Current Facility-Administered Medications  Medication Dose Route Frequency Provider Last Rate Last Admin   acetaminophen (TYLENOL) tablet 325 mg  325 mg Oral Q6H PRN Rhetta Mura, MD   325 mg at 04/07/23 0355   aspirin EC tablet 81 mg  81 mg Oral QHS Rhetta Mura, MD   81 mg at 04/07/23 1950   busPIRone (BUSPAR) tablet 15 mg  15 mg Oral BID Rhetta Mura, MD   15 mg at 04/08/23 0945   clopidogrel (PLAVIX) tablet 75 mg  75 mg Oral Daily Rhetta Mura, MD   75 mg at 04/08/23 0945   HYDROmorphone (DILAUDID) tablet 1 mg  1 mg Oral Q4H PRN Rhetta Mura, MD   1 mg at 04/08/23 7829   hydrOXYzine (ATARAX) tablet 25 mg  25 mg Oral BID PRN Rhetta Mura, MD   25 mg at 04/07/23 1951   labetalol (NORMODYNE) injection 5 mg  5 mg Intravenous Q2H PRN John Giovanni, MD   5 mg at 04/06/23 0110   levothyroxine (SYNTHROID) tablet 50 mcg  50 mcg Oral QAC breakfast Rhetta Mura, MD   50 mcg at 04/08/23 5621   meloxicam (MOBIC) tablet 15 mg  15 mg Oral Elpidio Anis, Alta Corning, MD   15 mg at 04/08/23 0946   metoprolol tartrate (LOPRESSOR) tablet 25 mg  25 mg Oral BID Rhetta Mura, MD   25 mg at 04/08/23 0946   naloxone (NARCAN) injection 0.4 mg  0.4 mg Intravenous PRN John Giovanni, MD       pantoprazole (PROTONIX) EC tablet 40 mg  40 mg Oral Daily Rhetta Mura, MD   40 mg at 04/08/23 0945   prochlorperazine (COMPAZINE) injection 5 mg  5 mg Intravenous Q6H PRN Dow Adolph  N, DO   5 mg at 04/07/23 1320   venlafaxine XR (EFFEXOR-XR) 24 hr capsule 75 mg  75 mg Oral Q breakfast Rhetta Mura, MD   75 mg at 04/08/23 3086     Discharge Medications: Please see discharge summary for a list of discharge medications.  Relevant Imaging Results:  Relevant Lab Results:   Additional Information SSN [id number] [place], [place]

## 2023-04-09 DIAGNOSIS — S12001A Unspecified nondisplaced fracture of first cervical vertebra, initial encounter for closed fracture: Secondary | ICD-10-CM | POA: Diagnosis not present

## 2023-04-09 MED ORDER — MELATONIN 5 MG PO TABS
5.0000 mg | ORAL_TABLET | Freq: Every evening | ORAL | Status: DC | PRN
  Administered 2023-04-09: 5 mg via ORAL
  Filled 2023-04-09: qty 1

## 2023-04-09 MED ORDER — VENLAFAXINE HCL ER 75 MG PO CP24: 75.0000 mg | ORAL_CAPSULE | Freq: Every day | ORAL | 0 refills | Status: DC

## 2023-04-09 MED ORDER — METOPROLOL TARTRATE 37.5 MG PO TABS: 37.5000 mg | ORAL_TABLET | Freq: Two times a day (BID) | ORAL | 1 refills | Status: DC

## 2023-04-09 MED ORDER — HYDROMORPHONE HCL 2 MG PO TABS: 1.0000 mg | ORAL_TABLET | ORAL | 0 refills | Status: DC | PRN

## 2023-04-09 NOTE — Progress Notes (Signed)
Physical Therapy Treatment Patient Details Name: Rhonda Davenport MRN: [id number] DOB: 08-22-1941 Today's Date: 04/09/2023   History of Present Illness Pt is an 82 y.o. female who presented 04/05/23 with head and neck pain, reporting a fall x2 months and then another fall x1 week prior.  Imaging showed no acute intracranial pathology, patent vasculature of the head and neck with no hemodynamically significant stenosis, occlusion, or dissection, and age-indeterminate nondisplaced fracture through the anterior C1 ring. PMH: anemia, gait disorder, GERD, h/o blood clots, HTN, Still's disease, thyroid disease, RA, CHF, CKD, seizure    PT Comments    Pt progressing towards her physical therapy goals and is agreeable to participate. Anterior padding of cervical collar not donned, so reapplied. Pt ambulating hallway distances with a walker at a min guard assist level. Demonstrates improved activity tolerance and ambulation distance. Presents as a high fall risk based on decreased gait speed, safety awareness, and history of recurrent falls. Patient will benefit from continued inpatient follow up therapy, <3 hours/day    Recommendations for follow up therapy are one component of a multi-disciplinary discharge planning process, led by the attending physician.  Recommendations may be updated based on patient status, additional functional criteria and insurance authorization.  Follow Up Recommendations       Assistance Recommended at Discharge Frequent or constant Supervision/Assistance  Patient can return home with the following A little help with walking and/or transfers;A little help with bathing/dressing/bathroom;Assistance with cooking/housework;Direct supervision/assist for financial management;Direct supervision/assist for medications management;Assist for transportation;Help with stairs or ramp for entrance   Equipment Recommendations  BSC/3in1    Recommendations for Other Services        Precautions / Restrictions Precautions Precautions: Fall;Cervical Precaution Booklet Issued: No Precaution Comments: reviewed precautions Required Braces or Orthoses: Cervical Brace Cervical Brace: Other (comment);At all times Restrictions Weight Bearing Restrictions: No     Mobility  Bed Mobility               General bed mobility comments: OOB in chair    Transfers Overall transfer level: Needs assistance Equipment used: Rolling walker (2 wheels) Transfers: Sit to/from Stand Sit to Stand: Min guard           General transfer comment: Min guard for safety. Standing impulsively without RW to reach for menu on bedside table    Ambulation/Gait Ambulation/Gait assistance: Min guard Gait Distance (Feet): 250 Feet Assistive device: Rolling walker (2 wheels) Gait Pattern/deviations: Decreased stride length, Trunk flexed, Step-through pattern       General Gait Details: Cues provided for walker proximity, right lateral lean and tendency for drift (pt reports feeling like walker was "pulling" her), min guard for safety throughout.   Stairs             Wheelchair Mobility    Modified Rankin (Stroke Patients Only) Modified Rankin (Stroke Patients Only) Pre-Morbid Rankin Score: No symptoms Modified Rankin: Moderately severe disability     Balance Overall balance assessment: Needs assistance Sitting-balance support: No upper extremity supported, Feet supported Sitting balance-Leahy Scale: Fair     Standing balance support: Bilateral upper extremity supported, During functional activity, Reliant on assistive device for balance Standing balance-Leahy Scale: Poor Standing balance comment: Reliant on RW                            Cognition Arousal/Alertness: Awake/alert Behavior During Therapy: Flat affect, Impulsive Overall Cognitive Status: Impaired/Different from baseline Area of Impairment: Awareness, Safety/judgement, Following  commands,  Attention                   Current Attention Level: Selective   Following Commands: Follows one step commands consistently, Follows one step commands with increased time, Follows multi-step commands inconsistently Safety/Judgement: Decreased awareness of safety, Decreased awareness of deficits Awareness: Emergent   General Comments: Pt can be mildly impulsive,decreased safety awareness        Exercises      General Comments        Pertinent Vitals/Pain Pain Assessment Pain Assessment: No/denies pain    Home Living                          Prior Function            PT Goals (current goals can now be found in the care plan section) Acute Rehab PT Goals Patient Stated Goal: to go home PT Goal Formulation: With patient/family Time For Goal Achievement: 04/21/23 Potential to Achieve Goals: Good Progress towards PT goals: Progressing toward goals    Frequency    Min 3X/week      PT Plan Current plan remains appropriate    Co-evaluation              AM-PAC PT "6 Clicks" Mobility   Outcome Measure  Help needed turning from your back to your side while in a flat bed without using bedrails?: A Little Help needed moving from lying on your back to sitting on the side of a flat bed without using bedrails?: A Little Help needed moving to and from a bed to a chair (including a wheelchair)?: A Little Help needed standing up from a chair using your arms (e.g., wheelchair or bedside chair)?: A Little Help needed to walk in hospital room?: A Little Help needed climbing 3-5 steps with a railing? : A Little 6 Click Score: 18    End of Session Equipment Utilized During Treatment: Gait belt;Cervical collar Activity Tolerance: Patient tolerated treatment well Patient left: in chair;with call bell/phone within reach;with chair alarm set;with family/visitor present Nurse Communication: Mobility status PT Visit Diagnosis: Unsteadiness on feet  (R26.81);Other abnormalities of gait and mobility (R26.89);Muscle weakness (generalized) (M62.81);History of falling (Z91.81);Repeated falls (R29.6);Difficulty in walking, not elsewhere classified (R26.2)     Time: 1610-9604 PT Time Calculation (min) (ACUTE ONLY): 15 min  Charges:  $Gait Training: 8-22 mins                     Lillia Pauls, PT, DPT Acute Rehabilitation Services Office [phone]    Norval Morton 04/09/2023, 10:51 AM

## 2023-04-09 NOTE — Significant Event (Signed)
Reviewed AVS with patient, daughter in law, and granddaughter. Paper prescription and two copies of AVS given to them. Patient and family verbalize understanding of content in the AVS. Per family, they will take her home first and then head to [facility]. Patient has all her belongings with them; wheeled out to transportation by volunteer. EVRN Lequita Halt to give report to [facility] after 11am per their requests.

## 2023-04-09 NOTE — TOC Transition Note (Signed)
Transition of Care [facility]) - CM/SW Discharge Note   Patient Details  Name: SHALIA BARTKO MRN: [id number] Date of Birth: Jan 29, 1941  Transition of Care [facility]) CM/SW Contact:  Baldemar Lenis, LCSW Phone Number: 04/09/2023, 10:35 AM   Clinical Narrative:   CSW sent discharge information to [facility], confirmed it was received. CSW met with patient and family at bedside, family will transport. Room ready for patient after 11.  Nurse to call report to [phone], Room 101.    Final next level of care: Skilled Nursing Facility Barriers to Discharge: Barriers Resolved   Patient Goals and CMS Choice      Discharge Placement                Patient chooses bed at: [facility] Patient to be transferred to facility by: Family Name of family member notified: Self Patient and family notified of of transfer: 04/09/23  Discharge Plan and Services Additional resources added to the After Visit Summary for                                       Social Determinants of Health (SDOH) Interventions SDOH Screenings   Tobacco Use: Medium Risk (04/05/2023)     Readmission Risk Interventions     No data to display

## 2023-04-09 NOTE — Care Management Important Message (Signed)
Important Message  Patient Details  Name: Rhonda Davenport MRN: [id number] Date of Birth: 1941-07-29   Medicare Important Message Given:  Yes  Patient left prior to IM delivery will mail  copy to the patient home address.    Kairon Shock 04/09/2023, 3:28 PM

## 2023-04-09 NOTE — Discharge Summary (Signed)
Physician Discharge Summary  Rhonda Davenport MWU:[id number] DOB: 1941-07-18 DOA: 04/05/2023  PCP: Clemencia Course, PA-C  Admit date: 04/05/2023 Discharge date: 04/09/2023  Time spent: 46 minutes  Recommendations for Outpatient Follow-up:  Note medication dosage changes and attempt to minimize narcotics use Tylenol first choice for pain, can use Dilaudid for severity >7/10 pain Recommend skilled nursing care placement-does need magnesium and Chem-12 in about 4 to 5 days Continue c-collar and follow-up in the outpatient setting with Dr. Effie Shy of [facility] neurosurgery who will assess the patient in consult as per scheduled appointment-please contact family to figure out details of this and transport her there if she is still at facility May need reinitiation of Lasix which was discontinued this hospitalization Please set up outpatient follow-up with gastroenterologist for esophageal web as per below Please set up follow-up with Dr. Deanne Coffer of rheumatology in the outpatient setting She is at high risk for polypharmacy please place close attention to any psychotropics or pain meds that you give her Please have her follow-up for wound care with the wound center Dr. 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 Patsy Lager 2012 Rheumatoid arthritis followed by Dr. Deanne Coffer locally Adult onset stills disease CKD 4 Hypothyroidism Chronic cerebral ischemia + hemispheric carotid artery syndrome  Hospitalization [facility] 2/13 through 2/15 with pneumonia ill-defined opacity left base and treated appropriately. Right pretibial venous stasis ulcer by [facility] c nonadherent dressing and follows with Trena Platt of the wound care center Known benign-appearing GE junction web not allowing 13 mm tablet of barium to pass along with moderate sliding hiatal hernia and severe reflux managed by Marliss Coots with associated weight loss over the past year year and a half   She fell about 1 week ago and hit her head took oxycodone without relief and saw Dr. Noel Gerold [neurosurgery] at [facility] regional supposed to get an MRI on 4/29 but because of severe pain came to [facility], ED-c-collar was placed at the recommendation of neurosurgery on-call   4/27-Discussed with Dr. Monia Pouch of neurosurgery and the patient should not need any acute surgery and should follow-up with her usual neurosurgeon Dr. Noel Gerold on discharge   Hospital-Problem based course   Neck pain with C1 ring fracture Age-indeterminate -new since prior CT spine 08/01/2022---Keep c-collar Pain seems moderately controlled--using Mobic 15 every morning,Tylenol for mild pain 325 every 6 as needed May use Dilaudid for very severe pain would try to avoid on discharge as per above   SVT about 25-30 beats 4/20 9 AM Adjust Lopressor to 37.5 twice daily-no recurrences since it occurred Requires a outpatient labs in about a week   HFrEF EF 40%-cardiac cath nonobstructive CAD i 2018 follows with Dr. Marianna Fuss cardiology Aspirin Plavix as above--see above discussion Previously on  Lasix q. OD which has been held at this time and can be resumed in the outpatient setting   Rheumatoid arthritis  follows with Dr. Deanne Coffer Does not appear to be on any disease modifying therapy Continue Mobic 15 with meals-   Prior TIAs/stroke-no records-continue Plavix 75, aspirin 81 and outpatient neurology follow-up   Chronic underlying dysphagia with GE web followed by Dr. Marliss Coots dysphagia 3 diet thin liquids and can follow-up with her usual gastroenterologist in the outpatient setting   CKD 3 AA managed by renal   Anxiety continue BuSpar 15 twice daily, hydroxyzine 25 twice daily as needed itching/anxiety Resume Pristiq equivalent, effexor 75 Is at pretty high risk for polypharmacy will need close attention to any psychotropic medications and care with opiate pain meds as she was dosing most of her meds by herself   Esophageal web/hiatal hernia with severe reflux recently Continue Protonix 40 daily   Right pretibial venous stasis ulcer Traumatic right knee wound secondary to fall Orders placed for PolyMem Ag padded dressing and Unna boot Wound examined on 4/29    Discharge Exam: Vitals:   04/09/23 0359 04/09/23 0736  BP: 133/61 133/63  Pulse: 69 65  Resp: 16 18  Temp: 97.6 F (36.4 C) 98.2 F (36.8 C)  SpO2: 98% 98%    Subj on day of d/c   Well no distress sitting in the chair looks comfortable  General Exam on discharge  Straight leg raise and straight arm raise bilaterally equal, pain minimal Abdomen soft Chest is clear S1-S2 no murmur Monitors reviewed sinus rhythm  Discharge Instructions   Discharge Instructions     Diet - low sodium heart healthy   Complete by: As directed    Discharge wound care:   Complete by: As directed    Wound care  See Comments      Comments: Wound care to RLE Wound: Cleanse with NS, pat dry. Cover with silver hydrofiber (Aquacel Ag+ Advantage, Lawson # [id number]) top with dry gauze, ABD pad and secure with Unna's Boot. Change twice weekly on Mondays and Thursdays.  May moisten silver hydrofiber to remove if adherent.   Increase  activity slowly   Complete by: As directed       Allergies as of 04/09/2023       Reactions   Ibuprofen Itching   Oxycodone    Other reaction(s): Lethargy (intolerance), Malaise (intolerance) "sleeps for 2 days"   Tramadol Itching   Atorvastatin    Other reaction(s): Myalgias (intolerance)   Ceftriaxone Other (See Comments)   Benztropine Rash   Codeine Palpitations   Diphenhydramine Hcl Itching   Tolerates ATARAX   Hydrocodone-acetaminophen Rash   Leflunomide Rash   Meloxicam Itching        Medication List     STOP taking these medications    baclofen 10 MG tablet Commonly known as: LIORESAL   desvenlafaxine 100 MG 24 hr tablet Commonly known as: PRISTIQ Replaced by: venlafaxine XR 75 MG 24 hr capsule   losartan 50 MG tablet Commonly known as: COZAAR   mirtazapine 15 MG tablet Commonly known as: REMERON   promethazine 12.5 MG tablet Commonly known as: PHENERGAN   tizanidine 2 MG capsule Commonly known as: ZANAFLEX   traZODone 50 MG tablet Commonly known as: DESYREL       TAKE these medications    acetaminophen 325 MG tablet Commonly known as: TYLENOL Take by mouth.   amLODipine 2.5 MG tablet Commonly known as: NORVASC Take 2.5 mg by  mouth daily.   aspirin 81 MG tablet Take 81 mg by mouth at bedtime.   busPIRone 10 MG tablet Commonly known as: BUSPAR Take 15 mg by mouth 2 (two) times daily.   clopidogrel 75 MG tablet Commonly known as: PLAVIX Take 1 tablet by mouth daily.   DUREZOL OP Apply 1 drop to eye 3 (three) times daily.   ergocalciferol 1.25 MG (50000 UT) capsule Commonly known as: VITAMIN D2 Take 50,000 Units by mouth every Thursday.   HYDROmorphone 2 MG tablet Commonly known as: DILAUDID Take 0.5 tablets (1 mg total) by mouth every 4 (four) hours as needed for severe pain.   hydrOXYzine 25 MG tablet Commonly known as: ATARAX Take 25 mg by mouth at bedtime.   levothyroxine 50 MCG tablet Commonly known as:  SYNTHROID Take 50 mcg by mouth daily before breakfast.   Metoprolol Tartrate 37.5 MG Tabs Take 1 tablet (37.5 mg total) by mouth 2 (two) times daily. What changed:  medication strength how much to take   ondansetron 4 MG disintegrating tablet Commonly known as: Zofran ODT 4mg  ODT q4 hours prn nausea/vomit   pantoprazole 40 MG tablet Commonly known as: PROTONIX Take 40 mg by mouth 2 (two) times daily as needed (reflux).   polyvinyl alcohol 1.4 % ophthalmic solution Commonly known as: LIQUIFILM TEARS Place 1 drop into both eyes 3 (three) times daily as needed for dry eyes.   pravastatin 20 MG tablet Commonly known as: PRAVACHOL Take 40 mg by mouth daily.   venlafaxine XR 75 MG 24 hr capsule Commonly known as: EFFEXOR-XR Take 1 capsule (75 mg total) by mouth daily with breakfast. Replaces: desvenlafaxine 100 MG 24 hr tablet               Discharge Care Instructions  (From admission, onward)           Start     Ordered   04/09/23 0000  Discharge wound care:       Comments: Wound care  See Comments      Comments: Wound care to RLE Wound: Cleanse with NS, pat dry. Cover with silver hydrofiber (Aquacel Ag+ Advantage, Lawson # [id number]) top with dry gauze, ABD pad and secure with Unna's Boot. Change twice weekly on Mondays and Thursdays.  May moisten silver hydrofiber to remove if adherent.   04/09/23 1018           Allergies  Allergen Reactions   Ibuprofen Itching   Oxycodone     Other reaction(s): Lethargy (intolerance), Malaise (intolerance) "sleeps for 2 days"   Tramadol Itching   Atorvastatin     Other reaction(s): Myalgias (intolerance)   Ceftriaxone Other (See Comments)   Benztropine Rash   Codeine Palpitations   Diphenhydramine Hcl Itching    Tolerates ATARAX   Hydrocodone-Acetaminophen Rash   Leflunomide Rash   Meloxicam Itching      The results of significant diagnostics from this hospitalization (including imaging, microbiology, ancillary  and laboratory) are listed below for reference.    Significant Diagnostic Studies: CT ANGIO HEAD NECK W WO CM  Result Date: 04/05/2023 CLINICAL DATA:  Severe pain in head and neck. Rule out dissection, bleed, skull fracture, hematoma. EXAM: CT ANGIOGRAPHY HEAD AND NECK WITH AND WITHOUT CONTRAST TECHNIQUE: Multidetector CT imaging of the head and neck was performed using the standard protocol during bolus administration of intravenous contrast. Multiplanar CT image reconstructions and MIPs were obtained to evaluate the vascular anatomy. Carotid stenosis measurements (when applicable) are obtained utilizing NASCET  criteria, using the distal internal carotid diameter as the denominator. RADIATION DOSE REDUCTION: This exam was performed according to the departmental dose-optimization program which includes automated exposure control, adjustment of the mA and/or kV according to patient size and/or use of iterative reconstruction technique. CONTRAST:  75mL OMNIPAQUE IOHEXOL 350 MG/ML SOLN COMPARISON:  CT head and cervical spine 08/01/2022 FINDINGS: CT HEAD FINDINGS Brain: There is no acute intracranial hemorrhage, extra-axial fluid collection, or acute infarct There is parenchymal volume loss with prominence of the ventricular system and extra-axial CSF spaces. Gray-white differentiation is preserved. Patchy hypodensity in the supratentorial white matter likely reflects sequela of chronic small-vessel ischemic change The pituitary and suprasellar region are normal. There is no mass lesion. There is no mass effect or midline shift. Vascular: See below. Skull: Normal. Negative for fracture or focal lesion. Sinuses/Orbits: There is complete opacification of the left maxillary sinus with hyperostosis consistent with chronic sinusitis. Bilateral lens implants are in place. The globes and orbits are otherwise unremarkable. Other: None. Review of the MIP images confirms the above findings CTA NECK FINDINGS Aortic arch: The  imaged aortic arch is normal. The origins of the major branch vessels are patent. The subclavian arteries are patent to the level imaged. Right carotid system: The right common, internal, and external carotid arteries are patent, without hemodynamically significant stenosis or occlusion there is no evidence of dissection or aneurysm. Left carotid system: The left common, internal, and external carotid arteries are patent, with minimal plaque of the bifurcation but no hemodynamically significant stenosis or occlusion. There is no evidence of dissection or aneurysm. Vertebral arteries: The vertebral arteries are patent, without hemodynamically significant stenosis or occlusion. There is no evidence of dissection or aneurysm. Skeleton: There is advanced degenerative change throughout the cervical spine with 6 mm anterolisthesis of C4 on C5, unchanged. There is severe multilevel disc degeneration and degenerative endplate change with reversal of the normal curvature centered at C5. There is a nondisplaced fracture through the anterior C1 ring which is age-indeterminate but new since the CT cervical spine from 08/01/2022. The fracture margins appear distinct. There is no visible canal hematoma. There is no significant prevertebral soft tissue swelling. Other neck: The soft tissues of the neck are unremarkable. Upper chest: The esophagus is patulous and fluid-filled. The imaged lung apices are clear. Review of the MIP images confirms the above findings CTA HEAD FINDINGS Anterior circulation: There is mild calcified plaque in the intracranial ICAs without significant stenosis or occlusion. The bilateral MCAs are patent, without proximal stenosis or occlusion. The bilateral ACAs are patent, without proximal stenosis or occlusion. There is no aneurysm or AVM. Posterior circulation: The bilateral V4 segments are patent with mild plaque on the left but no significant stenosis or occlusion. The basilar artery is patent. The  major cerebellar arteries appear patent. The bilateral PCAs are patent, without proximal stenosis or occlusion. Right larger than left posterior communicating arteries are identified. There is no aneurysm or AVM. Venous sinuses: Patent. Anatomic variants: None. Review of the MIP images confirms the above findings IMPRESSION: 1. No acute intracranial pathology. 2. Patent vasculature of the head and neck with no hemodynamically significant stenosis, occlusion, or dissection. 3. Nondisplaced fracture through the anterior C1 ring is age-indeterminate but new since the CT cervical spine from 08/01/2022. The fracture margins appear distinct raising suspicion for acute to subacute chronicity but there is no visible canal hematoma or significant prevertebral soft tissue swelling. 4. Chronic left maxillary sinusitis. 5. Patulous and fluid-filled esophagus. Correlate  with any history of reflux. Electronically Signed   By: Lesia Hausen M.D.   On: 04/05/2023 11:59    Microbiology: No results found for this or any previous visit (from the past 240 hour(s)).   Labs: Basic Metabolic Panel: Recent Labs  Lab 04/05/23 1039  NA 136  K 4.1  CL 105  CO2 21*  GLUCOSE 98  BUN 14  CREATININE 0.87  CALCIUM 8.9   Liver Function Tests: Recent Labs  Lab 04/05/23 1039  AST 19  ALT 9  ALKPHOS 53  BILITOT 0.5  PROT 7.3  ALBUMIN 3.8   No results for input(s): "LIPASE", "AMYLASE" in the last 168 hours. No results for input(s): "AMMONIA" in the last 168 hours. CBC: Recent Labs  Lab 04/05/23 1039  WBC 8.6  NEUTROABS 5.3  HGB 11.5*  HCT 35.2*  MCV 89.1  PLT 362   Cardiac Enzymes: No results for input(s): "CKTOTAL", "CKMB", "CKMBINDEX", "TROPONINI" in the last 168 hours. BNP: BNP (last 3 results) No results for input(s): "BNP" in the last 8760 hours.  ProBNP (last 3 results) No results for input(s): "PROBNP" in the last 8760 hours.  CBG: No results for input(s): "GLUCAP" in the last 168  hours.     Signed:  Rhetta Mura MD   [facility] 04/09/2023, 10:18 AM

## 2023-04-12 ENCOUNTER — Encounter: Payer: Self-pay | Admitting: Student

## 2023-04-12 ENCOUNTER — Non-Acute Institutional Stay (SKILLED_NURSING_FACILITY): Payer: Medicare Other | Admitting: Student

## 2023-04-12 DIAGNOSIS — Z45018 Encounter for adjustment and management of other part of cardiac pacemaker: Secondary | ICD-10-CM

## 2023-04-12 DIAGNOSIS — Z79899 Other long term (current) drug therapy: Secondary | ICD-10-CM

## 2023-04-12 DIAGNOSIS — Z7189 Other specified counseling: Secondary | ICD-10-CM

## 2023-04-12 DIAGNOSIS — G459 Transient cerebral ischemic attack, unspecified: Secondary | ICD-10-CM

## 2023-04-12 DIAGNOSIS — S12001A Unspecified nondisplaced fracture of first cervical vertebra, initial encounter for closed fracture: Secondary | ICD-10-CM | POA: Diagnosis not present

## 2023-04-12 DIAGNOSIS — N1832 Chronic kidney disease, stage 3b: Secondary | ICD-10-CM

## 2023-04-12 DIAGNOSIS — I5022 Chronic systolic (congestive) heart failure: Secondary | ICD-10-CM

## 2023-04-12 DIAGNOSIS — M069 Rheumatoid arthritis, unspecified: Secondary | ICD-10-CM

## 2023-04-12 NOTE — Progress Notes (Unsigned)
Provider:  Dr. Earnestine Mealing Location:  Other [place].  Nursing Home Room Number: [facility] 101A Place of Service:  SNF (31)  PCP: Rhonda Davenport, [place] Patient Care Team: Rhonda Davenport, [place] as PCP - General (Physician Assistant)  Extended Emergency Contact Information Primary Emergency Contact: Rhonda Davenport, [place] Home Phone: [phone] Relation: Brother Secondary Emergency Contact: Rhonda Davenport Mobile Phone: [phone] Relation: Friend  Code Status: Full Code Goals of Care: Advanced Directive information    04/12/2023   10:54 AM  Advanced Directives  Does Patient Have a Medical Advance Directive? No  Would patient like information on creating a medical advance directive? No - Patient declined      Chief Complaint  Patient presents with   New Admit To SNF    Admission.     HPI: Patient is a 82 y.o. female seen today for admission to She had a fall at home and broke her neck. She denies symptoms at the time of the fall. No pain since arrival for therapy.   She lives at home with her friend Rhonda Davenport and dog "lucky."  She uses a walker to get around. She has had numerous falls at home. She hadn't broken anything else with previous falls. She drives. Once a month has help with cleaning. Does cooking and laundry. She manages finances at home as well.   She doesn't drink alcohol or tobacco products. Denies recreational drugs as well.   She hasn't had much pain  since being here.  Advance Care Planning   Patient states she would not like chest compressions or intubation if she had cardiac arrest. Discussed that we would mail this form to her after discharge and should be displayed in the home.      Past Medical History:  Diagnosis Date   Anemia    Chronic low back pain    Fall    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   GERD (gastroesophageal reflux disease)    H/O blood clots    pt states had blood clot in her  rt arm   High cholesterol    Hypertension    Still's disease (HCC)    Thyroid disease    Hypothyroidism   Past Surgical History:  Procedure Laterality Date   ABDOMINAL HYSTERECTOMY     APPENDECTOMY     CARPAL TUNNEL RELEASE Bilateral    JOINT REPLACEMENT     TKR, left   SHOULDER SURGERY     Bilateral   TONSILLECTOMY      reports that she quit smoking about 40 years ago. Her smoking use included cigarettes. She has a 26.00 pack-year smoking history. She has never used smokeless tobacco. She reports that she does not drink alcohol and does not use drugs. Social History   Socioeconomic History   Marital status: Widowed    Spouse name: Not on file   Number of children: 0   Years of education: Not on file   Highest education level: Not on file  Occupational History   Occupation: Retired    Comment: Airline pilot  Tobacco Use   Smoking status: Former    Packs/day: 1.00    Years: 26.00    Additional pack years: 0.00    Total pack years: 26.00    Types: Cigarettes    Quit date: 12/10/1982    Years since quitting: 40.3   Smokeless tobacco: Never  Substance and Sexual Activity   Alcohol use: No  Drug use: No   Sexual activity: Not on file  Other Topics Concern   Not on file  Social History Narrative   Patient is right handed.   Patient drinks 1 cup caffeine daily   Social Determinants of Health   Financial Resource Strain: Not on file  Food Insecurity: Not on file  Transportation Needs: Not on file  Physical Activity: Not on file  Stress: Not on file  Social Connections: Not on file  Intimate Partner Violence: Not on file    Functional Status Survey:    Family History  Problem Relation Age of Onset   Rheum arthritis Mother    Rheum arthritis Father    Healthy Brother     Health Maintenance  Topic Date Due   Zoster Vaccines- Shingrix (1 of 2) Never done   DEXA SCAN  Never done   [employer] [facility] (AWV)  03/13/2022   COVID-19 Vaccine (6 - 2023-24 season)  08/10/2022   INFLUENZA VACCINE  07/11/2023   DTaP/Tdap/Td (2 - Td or Tdap) 08/01/2032   Pneumonia Vaccine 82+ Years old  Completed   HPV VACCINES  Aged Out    Allergies  Allergen Reactions   Ibuprofen Itching   Oxycodone     Other reaction(s): Lethargy (intolerance), Malaise (intolerance) "sleeps for 2 days"   Tramadol Itching   Atorvastatin     Other reaction(s): Myalgias (intolerance)   Ceftriaxone Other (See Comments)   Benztropine Rash   Codeine Palpitations   Diphenhydramine Hcl Itching    Tolerates ATARAX   Hydrocodone-Acetaminophen Rash   Leflunomide Rash   Meloxicam Itching    Outpatient Encounter Medications as of 04/12/2023  Medication Sig   acetaminophen (TYLENOL) 325 MG tablet Take 325 mg by mouth every 6 (six) hours as needed.   amLODipine (NORVASC) 2.5 MG tablet Take 2.5 mg by mouth daily.   busPIRone (BUSPAR) 10 MG tablet Take 15 mg by mouth 2 (two) times daily.   calcium carbonate (TUMS - DOSED IN MG ELEMENTAL CALCIUM) 500 MG chewable tablet Chew 1 tablet by mouth daily.   clopidogrel (PLAVIX) 75 MG tablet Take 1 tablet by mouth daily.   ergocalciferol (VITAMIN D2) 50000 UNITS capsule Take 50,000 Units by mouth every Thursday.    hydrOXYzine (ATARAX/VISTARIL) 25 MG tablet Take 25 mg by mouth at bedtime.   levothyroxine (SYNTHROID, LEVOTHROID) 50 MCG tablet Take 50 mcg by mouth daily before breakfast.    metoprolol tartrate 37.5 MG TABS Take 1 tablet (37.5 mg total) by mouth 2 (two) times daily.   ondansetron (ZOFRAN) 4 MG tablet Take 4 mg by mouth every 4 (four) hours as needed for nausea or vomiting.   pantoprazole (PROTONIX) 40 MG tablet Take 40 mg by mouth 2 (two) times daily as needed (reflux).   polyvinyl alcohol (LIQUIFILM TEARS) 1.4 % ophthalmic solution Place 1 drop into both eyes 3 (three) times daily as needed for dry eyes.   pravastatin (PRAVACHOL) 20 MG tablet Take 40 mg by mouth daily.   senna (SENOKOT) 8.6 MG TABS tablet Take 2 tablets by mouth at  bedtime.   Zinc Oxide (TRIPLE PASTE) 12.8 % ointment Apply 1 Application topically as needed for irritation. Every shift.   [DISCONTINUED] HYDROmorphone (DILAUDID) 2 MG tablet Take 0.5 tablets (1 mg total) by mouth every 4 (four) hours as needed for severe pain.   [DISCONTINUED] aspirin 81 MG tablet Take 81 mg by mouth at bedtime.   [DISCONTINUED] Difluprednate (DUREZOL OP) Apply 1 drop to eye 3 (three)  times daily. (Patient not taking: Reported on 04/06/2023)   [DISCONTINUED] ondansetron (ZOFRAN ODT) 4 MG disintegrating tablet 4mg  ODT q4 hours prn nausea/vomit (Patient taking differently: 4 mg every 4 (four) hours as needed. 4mg  ODT q4 hours prn nausea/vomit)   [DISCONTINUED] venlafaxine XR (EFFEXOR-XR) 75 MG 24 hr capsule Take 1 capsule (75 mg total) by mouth daily with breakfast.   No facility-administered encounter medications on file as of 04/12/2023.    Review of Systems  Vitals:   04/12/23 1052  BP: 111/68  Pulse: 69  Resp: 14  Temp: 98.4 F (36.9 C)  SpO2: 97%  Weight: 84 lb 9.6 oz (38.4 kg)  Height: 4\' 7"  (1.397 m)   Body mass index is 19.66 kg/m. Physical Exam Vitals reviewed.  Constitutional:      Comments: C- collar in place, thin  Cardiovascular:     Rate and Rhythm: Normal rate.     Pulses: Normal pulses.  Pulmonary:     Effort: Pulmonary effort is normal.  Abdominal:     General: Abdomen is flat.     Palpations: Abdomen is soft.  Skin:    General: Skin is warm.  Neurological:     General: No focal deficit present.     Mental Status: She is alert and oriented to person, place, and time. Mental status is at baseline.     Labs reviewed: Basic Metabolic Panel: Recent Labs    12/11/22 1042 04/05/23 1039  NA 137 136  K 5.1 4.1  CL 101 105  CO2 26 21*  GLUCOSE 89 98  BUN 17 14  CREATININE 1.13* 0.87  CALCIUM 11.3* 8.9   Liver Function Tests: Recent Labs    12/11/22 1042 04/05/23 1039  AST 18 19  ALT 8 9  ALKPHOS 49 53  BILITOT 0.4 0.5  PROT  8.1 7.3  ALBUMIN 4.5 3.8   Recent Labs    12/11/22 1042  LIPASE 37   No results for input(s): "AMMONIA" in the last 8760 hours. CBC: Recent Labs    12/11/22 1042 04/05/23 1039  WBC 7.5 8.6  NEUTROABS 4.1 5.3  HGB 12.8 11.5*  HCT 39.6 35.2*  MCV 86.3 89.1  PLT 319 362   Cardiac Enzymes: No results for input(s): "CKTOTAL", "CKMB", "CKMBINDEX", "TROPONINI" in the last 8760 hours. BNP: Invalid input(s): "POCBNP" No results found for: "HGBA1C" Lab Results  Component Value Date   TSH 0.404 12/20/2015   Lab Results  Component Value Date   VITAMINB12 634 01/05/2015   No results found for: "FOLATE" No results found for: "IRON", "TIBC", "FERRITIN"  Imaging and Procedures obtained prior to SNF admission: CT ANGIO HEAD NECK W WO CM  Result Date: 04/05/2023 CLINICAL DATA:  Severe pain in head and neck. Rule out dissection, bleed, skull fracture, hematoma. EXAM: CT ANGIOGRAPHY HEAD AND NECK WITH AND WITHOUT CONTRAST TECHNIQUE: Multidetector CT imaging of the head and neck was performed using the standard protocol during bolus administration of intravenous contrast. Multiplanar CT image reconstructions and MIPs were obtained to evaluate the vascular anatomy. Carotid stenosis measurements (when applicable) are obtained utilizing NASCET criteria, using the distal internal carotid diameter as the denominator. RADIATION DOSE REDUCTION: This exam was performed according to the departmental dose-optimization program which includes automated exposure control, adjustment of the mA and/or kV according to patient size and/or use of iterative reconstruction technique. CONTRAST:  75mL OMNIPAQUE IOHEXOL 350 MG/ML SOLN COMPARISON:  CT head and cervical spine 08/01/2022 FINDINGS: CT HEAD FINDINGS Brain: There is no acute  intracranial hemorrhage, extra-axial fluid collection, or acute infarct There is parenchymal volume loss with prominence of the ventricular system and extra-axial CSF spaces. Gray-white  differentiation is preserved. Patchy hypodensity in the supratentorial white matter likely reflects sequela of chronic small-vessel ischemic change The pituitary and suprasellar region are normal. There is no mass lesion. There is no mass effect or midline shift. Vascular: See below. Skull: Normal. Negative for fracture or focal lesion. Sinuses/Orbits: There is complete opacification of the left maxillary sinus with hyperostosis consistent with chronic sinusitis. Bilateral lens implants are in place. The globes and orbits are otherwise unremarkable. Other: None. Review of the MIP images confirms the above findings CTA NECK FINDINGS Aortic arch: The imaged aortic arch is normal. The origins of the major branch vessels are patent. The subclavian arteries are patent to the level imaged. Right carotid system: The right common, internal, and external carotid arteries are patent, without hemodynamically significant stenosis or occlusion there is no evidence of dissection or aneurysm. Left carotid system: The left common, internal, and external carotid arteries are patent, with minimal plaque of the bifurcation but no hemodynamically significant stenosis or occlusion. There is no evidence of dissection or aneurysm. Vertebral arteries: The vertebral arteries are patent, without hemodynamically significant stenosis or occlusion. There is no evidence of dissection or aneurysm. Skeleton: There is advanced degenerative change throughout the cervical spine with 6 mm anterolisthesis of C4 on C5, unchanged. There is severe multilevel disc degeneration and degenerative endplate change with reversal of the normal curvature centered at C5. There is a nondisplaced fracture through the anterior C1 ring which is age-indeterminate but new since the CT cervical spine from 08/01/2022. The fracture margins appear distinct. There is no visible canal hematoma. There is no significant prevertebral soft tissue swelling. Other neck: The soft  tissues of the neck are unremarkable. Upper chest: The esophagus is patulous and fluid-filled. The imaged lung apices are clear. Review of the MIP images confirms the above findings CTA HEAD FINDINGS Anterior circulation: There is mild calcified plaque in the intracranial ICAs without significant stenosis or occlusion. The bilateral MCAs are patent, without proximal stenosis or occlusion. The bilateral ACAs are patent, without proximal stenosis or occlusion. There is no aneurysm or AVM. Posterior circulation: The bilateral V4 segments are patent with mild plaque on the left but no significant stenosis or occlusion. The basilar artery is patent. The major cerebellar arteries appear patent. The bilateral PCAs are patent, without proximal stenosis or occlusion. Right larger than left posterior communicating arteries are identified. There is no aneurysm or AVM. Venous sinuses: Patent. Anatomic variants: None. Review of the MIP images confirms the above findings IMPRESSION: 1. No acute intracranial pathology. 2. Patent vasculature of the head and neck with no hemodynamically significant stenosis, occlusion, or dissection. 3. Nondisplaced fracture through the anterior C1 ring is age-indeterminate but new since the CT cervical spine from 08/01/2022. The fracture margins appear distinct raising suspicion for acute to subacute chronicity but there is no visible canal hematoma or significant prevertebral soft tissue swelling. 4. Chronic left maxillary sinusitis. 5. Patulous and fluid-filled esophagus. Correlate with any history of reflux. Electronically Signed   By: Lesia Hausen M.D.   On: 04/05/2023 11:59    Assessment/Plan Closed nondisplaced fracture of first cervical vertebra, unspecified fracture morphology, initial encounter (HCC)  Adjustment and management of cardiac pacemaker - Plan: Do not attempt resuscitation (DNR)  Goals of care, counseling/discussion  Rheumatoid arthritis involving multiple joints  (HCC), Chronic  Chronic systolic heart failure (  HCC), Chronic  Chronic kidney disease (CKD) stage G3b/A3, moderately decreased glomerular filtration rate (GFR) between 30-44 mL/min/1.73 square meter and albuminuria creatinine ratio greater than 300 mg/g (HCC), Chronic  Polypharmacy  TIA (transient ischemic attack) Patient with c-spine fracture. Pain well-controlled and no need for dilaudid, discontinue. GOC discussion to update code status, noted above. HR well-controlled on current medications. Patient takes atarax for anxiety. Symptoms well-controlled with buspar. Patient states venlfaxine was restarted inpatient, she was not taking prior to fall. Given polypharmacy and increased risk of fall with numerous meds will keep discontinued at this time.  Hx of TIA, no symptoms at this time. Continue plavix and pravachol.     Family/ staff Communication: nursing  Labs/tests ordered: CBC, BMP, Physical therapy

## 2023-04-16 DIAGNOSIS — M4150 Other secondary scoliosis, site unspecified: Secondary | ICD-10-CM | POA: Insufficient documentation

## 2023-10-18 ENCOUNTER — Emergency Department: Payer: Medicare Other

## 2023-10-18 ENCOUNTER — Inpatient Hospital Stay
Admission: EM | Admit: 2023-10-18 | Discharge: 2023-10-25 | DRG: 065 | Disposition: A | Discharge status: Home Health | Payer: Medicare Other | Attending: Internal Medicine | Admitting: Internal Medicine

## 2023-10-18 ENCOUNTER — Other Ambulatory Visit: Payer: Self-pay

## 2023-10-18 DIAGNOSIS — R29700 NIHSS score 0: Secondary | ICD-10-CM | POA: Diagnosis present

## 2023-10-18 DIAGNOSIS — K219 Gastro-esophageal reflux disease without esophagitis: Secondary | ICD-10-CM | POA: Diagnosis present

## 2023-10-18 DIAGNOSIS — R112 Nausea with vomiting, unspecified: Secondary | ICD-10-CM | POA: Diagnosis not present

## 2023-10-18 DIAGNOSIS — I10 Essential (primary) hypertension: Secondary | ICD-10-CM | POA: Diagnosis present

## 2023-10-18 DIAGNOSIS — M8448XA Pathological fracture, other site, initial encounter for fracture: Secondary | ICD-10-CM | POA: Diagnosis present

## 2023-10-18 DIAGNOSIS — Z7902 Long term (current) use of antithrombotics/antiplatelets: Secondary | ICD-10-CM

## 2023-10-18 DIAGNOSIS — Z1152 Encounter for screening for COVID-19: Secondary | ICD-10-CM

## 2023-10-18 DIAGNOSIS — Z8673 Personal history of transient ischemic attack (TIA), and cerebral infarction without residual deficits: Secondary | ICD-10-CM

## 2023-10-18 DIAGNOSIS — R636 Underweight: Secondary | ICD-10-CM | POA: Diagnosis present

## 2023-10-18 DIAGNOSIS — E7800 Pure hypercholesterolemia, unspecified: Secondary | ICD-10-CM | POA: Diagnosis present

## 2023-10-18 DIAGNOSIS — M069 Rheumatoid arthritis, unspecified: Secondary | ICD-10-CM | POA: Diagnosis present

## 2023-10-18 DIAGNOSIS — K5900 Constipation, unspecified: Secondary | ICD-10-CM | POA: Diagnosis present

## 2023-10-18 DIAGNOSIS — I2510 Atherosclerotic heart disease of native coronary artery without angina pectoris: Secondary | ICD-10-CM | POA: Diagnosis present

## 2023-10-18 DIAGNOSIS — N1832 Chronic kidney disease, stage 3b: Secondary | ICD-10-CM | POA: Diagnosis present

## 2023-10-18 DIAGNOSIS — Z90710 Acquired absence of both cervix and uterus: Secondary | ICD-10-CM

## 2023-10-18 DIAGNOSIS — Z8261 Family history of arthritis: Secondary | ICD-10-CM

## 2023-10-18 DIAGNOSIS — M0820 Juvenile rheumatoid arthritis with systemic onset, unspecified site: Secondary | ICD-10-CM | POA: Diagnosis present

## 2023-10-18 DIAGNOSIS — Z886 Allergy status to analgesic agent status: Secondary | ICD-10-CM

## 2023-10-18 DIAGNOSIS — M47812 Spondylosis without myelopathy or radiculopathy, cervical region: Secondary | ICD-10-CM | POA: Diagnosis present

## 2023-10-18 DIAGNOSIS — I5022 Chronic systolic (congestive) heart failure: Secondary | ICD-10-CM | POA: Diagnosis present

## 2023-10-18 DIAGNOSIS — Z79899 Other long term (current) drug therapy: Secondary | ICD-10-CM

## 2023-10-18 DIAGNOSIS — I63541 Cerebral infarction due to unspecified occlusion or stenosis of right cerebellar artery: Secondary | ICD-10-CM | POA: Diagnosis not present

## 2023-10-18 DIAGNOSIS — E039 Hypothyroidism, unspecified: Secondary | ICD-10-CM | POA: Diagnosis present

## 2023-10-18 DIAGNOSIS — Z87891 Personal history of nicotine dependence: Secondary | ICD-10-CM

## 2023-10-18 DIAGNOSIS — Z888 Allergy status to other drugs, medicaments and biological substances status: Secondary | ICD-10-CM

## 2023-10-18 DIAGNOSIS — Z681 Body mass index (BMI) 19 or less, adult: Secondary | ICD-10-CM

## 2023-10-18 DIAGNOSIS — I639 Cerebral infarction, unspecified: Secondary | ICD-10-CM | POA: Diagnosis present

## 2023-10-18 DIAGNOSIS — G43909 Migraine, unspecified, not intractable, without status migrainosus: Secondary | ICD-10-CM | POA: Diagnosis present

## 2023-10-18 DIAGNOSIS — R64 Cachexia: Secondary | ICD-10-CM | POA: Diagnosis present

## 2023-10-18 DIAGNOSIS — R29898 Other symptoms and signs involving the musculoskeletal system: Secondary | ICD-10-CM | POA: Diagnosis present

## 2023-10-18 DIAGNOSIS — D509 Iron deficiency anemia, unspecified: Secondary | ICD-10-CM | POA: Diagnosis present

## 2023-10-18 DIAGNOSIS — S12000A Unspecified displaced fracture of first cervical vertebra, initial encounter for closed fracture: Secondary | ICD-10-CM | POA: Diagnosis present

## 2023-10-18 DIAGNOSIS — G8929 Other chronic pain: Secondary | ICD-10-CM | POA: Diagnosis present

## 2023-10-18 DIAGNOSIS — Z885 Allergy status to narcotic agent status: Secondary | ICD-10-CM

## 2023-10-18 DIAGNOSIS — M545 Low back pain, unspecified: Secondary | ICD-10-CM | POA: Diagnosis present

## 2023-10-18 DIAGNOSIS — M4322 Fusion of spine, cervical region: Secondary | ICD-10-CM | POA: Diagnosis present

## 2023-10-18 DIAGNOSIS — Z79890 Hormone replacement therapy (postmenopausal): Secondary | ICD-10-CM

## 2023-10-18 DIAGNOSIS — I130 Hypertensive heart and chronic kidney disease with heart failure and stage 1 through stage 4 chronic kidney disease, or unspecified chronic kidney disease: Secondary | ICD-10-CM | POA: Diagnosis present

## 2023-10-18 LAB — COMPREHENSIVE METABOLIC PANEL
ALT: 7 U/L (ref 0–44)
AST: 18 U/L (ref 15–41)
Albumin: 4.2 g/dL (ref 3.5–5.0)
Alkaline Phosphatase: 46 U/L (ref 38–126)
Anion gap: 14 (ref 5–15)
BUN: 28 mg/dL — ABNORMAL HIGH (ref 8–23)
CO2: 25 mmol/L (ref 22–32)
Calcium: 10.1 mg/dL (ref 8.9–10.3)
Chloride: 96 mmol/L — ABNORMAL LOW (ref 98–111)
Creatinine, Ser: 1.09 mg/dL — ABNORMAL HIGH (ref 0.44–1.00)
GFR, Estimated: 50 mL/min — ABNORMAL LOW (ref >60)
Glucose, Bld: 109 mg/dL — ABNORMAL HIGH (ref 70–99)
Potassium: 3.9 mmol/L (ref 3.5–5.1)
Sodium: 135 mmol/L (ref 135–145)
Total Bilirubin: 0.5 mg/dL (ref <1.2)
Total Protein: 8 g/dL (ref 6.5–8.1)

## 2023-10-18 LAB — RESP PANEL BY RT-PCR (RSV, FLU A&B, COVID)  RVPGX2
Influenza A by PCR: NEGATIVE
Influenza B by PCR: NEGATIVE
Resp Syncytial Virus by PCR: NEGATIVE
SARS Coronavirus 2 by RT PCR: NEGATIVE

## 2023-10-18 LAB — CBC
HCT: 33.5 % — ABNORMAL LOW (ref 36.0–46.0)
Hemoglobin: 11.1 g/dL — ABNORMAL LOW (ref 12.0–15.0)
MCH: 27.9 pg (ref 26.0–34.0)
MCHC: 33.1 g/dL (ref 30.0–36.0)
MCV: 84.2 fL (ref 80.0–100.0)
Platelets: 356 10*3/uL (ref 150–400)
RBC: 3.98 MIL/uL (ref 3.87–5.11)
RDW: 11.9 % (ref 11.5–15.5)
WBC: 8.7 10*3/uL (ref 4.0–10.5)
nRBC: 0 % (ref 0.0–0.2)

## 2023-10-18 LAB — TROPONIN I (HIGH SENSITIVITY)
Troponin I (High Sensitivity): 20 ng/L — ABNORMAL HIGH (ref <18)
Troponin I (High Sensitivity): 16 ng/L (ref <18)

## 2023-10-18 LAB — LIPASE, BLOOD: Lipase: 64 U/L — ABNORMAL HIGH (ref 11–51)

## 2023-10-18 MED ORDER — PANTOPRAZOLE SODIUM 40 MG IV SOLR
40.0000 mg | Freq: Once | INTRAVENOUS | Status: AC
  Administered 2023-10-18: 40 mg via INTRAVENOUS
  Filled 2023-10-18: qty 10

## 2023-10-18 MED ORDER — SODIUM CHLORIDE 0.9 % IV BOLUS
500.0000 mL | Freq: Once | INTRAVENOUS | Status: AC
Start: 2023-10-18 — End: 2023-10-18
  Administered 2023-10-18: 500 mL via INTRAVENOUS

## 2023-10-18 MED ORDER — IOHEXOL 300 MG/ML  SOLN
75.0000 mL | Freq: Once | INTRAMUSCULAR | Status: AC | PRN
  Administered 2023-10-18: 75 mL via INTRAVENOUS

## 2023-10-18 MED ORDER — ONDANSETRON HCL 4 MG/2ML IJ SOLN
4.0000 mg | Freq: Once | INTRAMUSCULAR | Status: AC
  Administered 2023-10-18: 4 mg via INTRAVENOUS
  Filled 2023-10-18: qty 2

## 2023-10-18 NOTE — ED Triage Notes (Addendum)
Pt to ED via [facility] from [facility] Pt reprots woke up this morning at 1am feeling nausea and vomtied several times. [place] gave PRN medication with no relief. EMS gave 4mg  zofran IM. Pt vomited x1 on arrival. Pt also reports no appetite and weakness. Pt is on Plavix.   Pt reports did fall off the bed a few days ago and hit her head. Pt denies LOC.

## 2023-10-18 NOTE — ED Provider Notes (Signed)
[facility] Provider Note    Event Date/Time   First MD Initiated Contact with Patient 10/18/23 2107     (approximate)   History   Emesis   HPI  Rhonda Davenport is a 82 y.o. female with rheumatoid arthritis, iron deficient anemia, on methotrexate who comes in for nausea, vomiting that started yesterday.  She reports a few episodes of vomiting.  She reports having no stool for about a week.  She also reports having a fall few days ago and unsure if she hit her head.  She denies any obvious chest pain, shortness of breath.  She is on Plavix.  I reviewed patient's office visit from yesterday where she was seen for anemia Physical Exam   Triage Vital Signs: ED Triage Vitals  Encounter Vitals Group     BP 10/18/23 1653 (!) 157/69     Systolic BP Percentile --      Diastolic BP Percentile --      Pulse Rate 10/18/23 1653 61     Resp 10/18/23 1653 18     Temp 10/18/23 1655 98.4 F (36.9 C)     Temp Source 10/18/23 1655 Oral     SpO2 10/18/23 1653 98 %     Weight --      Height --      Head Circumference --      Peak Flow --      Pain Score 10/18/23 1653 4     Pain Loc --      Pain Education --      Exclude from Growth Chart --     Most recent vital signs: Vitals:   10/18/23 1655 10/18/23 2118  BP:  138/77  Pulse:  67  Resp:  14  Temp: 98.4 F (36.9 C) 98.3 F (36.8 C)  SpO2:  100%     General: Awake, no distress.  CV:  Good peripheral perfusion.  Resp:  Normal effort.  Abd:  No distention.  Slightly tender in her abdomen. Other:  Able to lift both legs up off the ground.   ED Results / Procedures / Treatments   Labs (all labs ordered are listed, but only abnormal results are displayed) Labs Reviewed  LIPASE, BLOOD - Abnormal; Notable for the following components:      Result Value   Lipase 64 (*)    All other components within normal limits  COMPREHENSIVE METABOLIC PANEL - Abnormal; Notable for the following components:    Chloride 96 (*)    Glucose, Bld 109 (*)    BUN 28 (*)    Creatinine, Ser 1.09 (*)    GFR, Estimated 50 (*)    All other components within normal limits  CBC - Abnormal; Notable for the following components:   Hemoglobin 11.1 (*)    HCT 33.5 (*)    All other components within normal limits  URINALYSIS, ROUTINE W REFLEX MICROSCOPIC     EKG  My interpretation of EKG:  Pending   RADIOLOGY I have reviewed the CT head personally and interpreted and no ICH    PROCEDURES:  Critical Care performed: No  Procedures   MEDICATIONS ORDERED IN ED: Medications  sodium chloride 0.9 % bolus 500 mL (has no administration in time range)  pantoprazole (PROTONIX) injection 40 mg (has no administration in time range)  ondansetron (ZOFRAN) injection 4 mg (has no administration in time range)     IMPRESSION / MDM / ASSESSMENT AND PLAN / ED COURSE  I reviewed the triage vital signs and the nursing notes.   Patient's presentation is most consistent with acute presentation with potential threat to life or bodily function.   Patient comes in with nausea, vomiting in the setting of recent head trauma will get CT head to evaluate for any intracranial hemorrhage she does report some cervical pain so we will get CT cervical as well.  She is tender on palpation so get CT abdomen to evaluate for any obstruction, perforation.  P will treat patient symptomatically.  CBC shows stable hemoglobin.  Lipase slightly elevated.  CMP shows slight bump in her creatinine but is had that similarly previously.  Patient had off pending CTs, repeat troponin, repeat evaluation    FINAL CLINICAL IMPRESSION(S) / ED DIAGNOSES   Final diagnoses:  Nausea and vomiting, unspecified vomiting type     Rx / DC Orders   ED Discharge Orders     None        Note:  This document was prepared using Dragon voice recognition software and may include unintentional dictation errors.   Concha Se, MD 10/18/23  2252

## 2023-10-19 ENCOUNTER — Inpatient Hospital Stay: Payer: Medicare Other

## 2023-10-19 ENCOUNTER — Inpatient Hospital Stay (HOSPITAL_COMMUNITY)
Admit: 2023-10-19 | Discharge: 2023-10-19 | Disposition: A | Discharge status: Home / Self Care | Payer: Medicare Other | Attending: Family Medicine | Admitting: Family Medicine

## 2023-10-19 ENCOUNTER — Emergency Department: Payer: Medicare Other

## 2023-10-19 ENCOUNTER — Encounter: Payer: Self-pay | Admitting: Radiology

## 2023-10-19 DIAGNOSIS — D509 Iron deficiency anemia, unspecified: Secondary | ICD-10-CM | POA: Diagnosis present

## 2023-10-19 DIAGNOSIS — M069 Rheumatoid arthritis, unspecified: Secondary | ICD-10-CM | POA: Diagnosis not present

## 2023-10-19 DIAGNOSIS — E7800 Pure hypercholesterolemia, unspecified: Secondary | ICD-10-CM | POA: Diagnosis present

## 2023-10-19 DIAGNOSIS — S12100G Unspecified displaced fracture of second cervical vertebra, subsequent encounter for fracture with delayed healing: Secondary | ICD-10-CM

## 2023-10-19 DIAGNOSIS — M545 Low back pain, unspecified: Secondary | ICD-10-CM | POA: Diagnosis present

## 2023-10-19 DIAGNOSIS — R29700 NIHSS score 0: Secondary | ICD-10-CM | POA: Diagnosis present

## 2023-10-19 DIAGNOSIS — Z886 Allergy status to analgesic agent status: Secondary | ICD-10-CM | POA: Diagnosis not present

## 2023-10-19 DIAGNOSIS — I5020 Unspecified systolic (congestive) heart failure: Secondary | ICD-10-CM | POA: Diagnosis not present

## 2023-10-19 DIAGNOSIS — I2510 Atherosclerotic heart disease of native coronary artery without angina pectoris: Secondary | ICD-10-CM

## 2023-10-19 DIAGNOSIS — I6389 Other cerebral infarction: Secondary | ICD-10-CM | POA: Diagnosis not present

## 2023-10-19 DIAGNOSIS — R112 Nausea with vomiting, unspecified: Secondary | ICD-10-CM | POA: Diagnosis present

## 2023-10-19 DIAGNOSIS — M8448XA Pathological fracture, other site, initial encounter for fracture: Secondary | ICD-10-CM | POA: Diagnosis present

## 2023-10-19 DIAGNOSIS — M47812 Spondylosis without myelopathy or radiculopathy, cervical region: Secondary | ICD-10-CM | POA: Diagnosis present

## 2023-10-19 DIAGNOSIS — I6309 Cerebral infarction due to thrombosis of other precerebral artery: Secondary | ICD-10-CM | POA: Diagnosis not present

## 2023-10-19 DIAGNOSIS — N189 Chronic kidney disease, unspecified: Secondary | ICD-10-CM

## 2023-10-19 DIAGNOSIS — I130 Hypertensive heart and chronic kidney disease with heart failure and stage 1 through stage 4 chronic kidney disease, or unspecified chronic kidney disease: Secondary | ICD-10-CM | POA: Diagnosis present

## 2023-10-19 DIAGNOSIS — M4322 Fusion of spine, cervical region: Secondary | ICD-10-CM | POA: Diagnosis present

## 2023-10-19 DIAGNOSIS — Z888 Allergy status to other drugs, medicaments and biological substances status: Secondary | ICD-10-CM | POA: Diagnosis not present

## 2023-10-19 DIAGNOSIS — R636 Underweight: Secondary | ICD-10-CM | POA: Diagnosis present

## 2023-10-19 DIAGNOSIS — R64 Cachexia: Secondary | ICD-10-CM | POA: Diagnosis present

## 2023-10-19 DIAGNOSIS — Z681 Body mass index (BMI) 19 or less, adult: Secondary | ICD-10-CM | POA: Diagnosis not present

## 2023-10-19 DIAGNOSIS — I5022 Chronic systolic (congestive) heart failure: Secondary | ICD-10-CM | POA: Diagnosis not present

## 2023-10-19 DIAGNOSIS — M0820 Juvenile rheumatoid arthritis with systemic onset, unspecified site: Secondary | ICD-10-CM | POA: Diagnosis present

## 2023-10-19 DIAGNOSIS — I639 Cerebral infarction, unspecified: Secondary | ICD-10-CM | POA: Diagnosis not present

## 2023-10-19 DIAGNOSIS — G43909 Migraine, unspecified, not intractable, without status migrainosus: Secondary | ICD-10-CM | POA: Diagnosis present

## 2023-10-19 DIAGNOSIS — K219 Gastro-esophageal reflux disease without esophagitis: Secondary | ICD-10-CM | POA: Diagnosis present

## 2023-10-19 DIAGNOSIS — I10 Essential (primary) hypertension: Secondary | ICD-10-CM | POA: Diagnosis not present

## 2023-10-19 DIAGNOSIS — K5900 Constipation, unspecified: Secondary | ICD-10-CM | POA: Diagnosis present

## 2023-10-19 DIAGNOSIS — I63541 Cerebral infarction due to unspecified occlusion or stenosis of right cerebellar artery: Secondary | ICD-10-CM | POA: Diagnosis present

## 2023-10-19 DIAGNOSIS — N1832 Chronic kidney disease, stage 3b: Secondary | ICD-10-CM | POA: Diagnosis present

## 2023-10-19 DIAGNOSIS — Z1152 Encounter for screening for COVID-19: Secondary | ICD-10-CM | POA: Diagnosis not present

## 2023-10-19 DIAGNOSIS — E039 Hypothyroidism, unspecified: Secondary | ICD-10-CM | POA: Diagnosis present

## 2023-10-19 DIAGNOSIS — R000 Tachycardia, unspecified: Secondary | ICD-10-CM | POA: Diagnosis not present

## 2023-10-19 DIAGNOSIS — G8929 Other chronic pain: Secondary | ICD-10-CM | POA: Diagnosis present

## 2023-10-19 LAB — URINALYSIS, ROUTINE W REFLEX MICROSCOPIC
Bilirubin Urine: NEGATIVE
Glucose, UA: NEGATIVE mg/dL
Hgb urine dipstick: NEGATIVE
Ketones, ur: 5 mg/dL — AB
Leukocytes,Ua: NEGATIVE
Nitrite: NEGATIVE
Protein, ur: NEGATIVE mg/dL
Specific Gravity, Urine: 1.035 — ABNORMAL HIGH (ref 1.005–1.030)
pH: 6 (ref 5.0–8.0)

## 2023-10-19 LAB — HEMOGLOBIN A1C
Hgb A1c MFr Bld: 5.5 % (ref 4.8–5.6)
Mean Plasma Glucose: 111.15 mg/dL

## 2023-10-19 LAB — GLUCOSE, CAPILLARY: Glucose-Capillary: 80 mg/dL (ref 70–99)

## 2023-10-19 MED ORDER — ACETAMINOPHEN 160 MG/5ML PO SOLN: 650.0000 mg | ORAL | Status: DC | PRN

## 2023-10-19 MED ORDER — MORPHINE SULFATE (PF) 2 MG/ML IV SOLN
2.0000 mg | INTRAVENOUS | Status: DC | PRN
  Administered 2023-10-19 – 2023-10-20 (×2): 2 mg via INTRAVENOUS
  Filled 2023-10-19 (×2): qty 1

## 2023-10-19 MED ORDER — GADOBUTROL 1 MMOL/ML IV SOLN
3.0000 mL | Freq: Once | INTRAVENOUS | Status: AC | PRN
  Administered 2023-10-19: 3 mL via INTRAVENOUS

## 2023-10-19 MED ORDER — PANTOPRAZOLE SODIUM 40 MG PO TBEC
40.0000 mg | DELAYED_RELEASE_TABLET | Freq: Two times a day (BID) | ORAL | Status: DC | PRN
  Administered 2023-10-21 – 2023-10-22 (×2): 40 mg via ORAL
  Filled 2023-10-19 (×2): qty 1

## 2023-10-19 MED ORDER — LABETALOL HCL 5 MG/ML IV SOLN: 10.0000 mg | INTRAVENOUS | Status: DC | PRN

## 2023-10-19 MED ORDER — ACETAMINOPHEN 500 MG PO TABS
1000.0000 mg | ORAL_TABLET | Freq: Once | ORAL | Status: AC
  Administered 2023-10-19: 1000 mg via ORAL
  Filled 2023-10-19: qty 2

## 2023-10-19 MED ORDER — SENNOSIDES-DOCUSATE SODIUM 8.6-50 MG PO TABS: 1.0000 | ORAL_TABLET | Freq: Every evening | ORAL | Status: DC | PRN

## 2023-10-19 MED ORDER — CLOPIDOGREL BISULFATE 75 MG PO TABS
75.0000 mg | ORAL_TABLET | Freq: Every day | ORAL | Status: DC
  Administered 2023-10-20 – 2023-10-25 (×6): 75 mg via ORAL
  Filled 2023-10-19 (×6): qty 1

## 2023-10-19 MED ORDER — SODIUM CHLORIDE 0.9 % IV SOLN: INTRAVENOUS | Status: DC

## 2023-10-19 MED ORDER — LORAZEPAM 2 MG/ML IJ SOLN
0.5000 mg | INTRAMUSCULAR | Status: DC | PRN
  Administered 2023-10-19 – 2023-10-23 (×4): 0.5 mg via INTRAVENOUS
  Filled 2023-10-19 (×5): qty 1

## 2023-10-19 MED ORDER — ACETAMINOPHEN 325 MG RE SUPP: 650.0000 mg | RECTAL | Status: DC | PRN

## 2023-10-19 MED ORDER — ASPIRIN 81 MG PO TBEC
81.0000 mg | DELAYED_RELEASE_TABLET | Freq: Every day | ORAL | Status: DC
  Administered 2023-10-20 – 2023-10-25 (×6): 81 mg via ORAL
  Filled 2023-10-19 (×8): qty 1

## 2023-10-19 MED ORDER — HYDROXYZINE HCL 50 MG PO TABS
25.0000 mg | ORAL_TABLET | Freq: Three times a day (TID) | ORAL | Status: DC | PRN
  Administered 2023-10-21 – 2023-10-24 (×6): 25 mg via ORAL
  Filled 2023-10-19 (×6): qty 1

## 2023-10-19 MED ORDER — ACETAMINOPHEN 325 MG PO TABS
650.0000 mg | ORAL_TABLET | ORAL | Status: DC | PRN
  Administered 2023-10-20 – 2023-10-25 (×13): 650 mg via ORAL
  Filled 2023-10-19 (×13): qty 2

## 2023-10-19 MED ORDER — IOHEXOL 350 MG/ML SOLN
75.0000 mL | Freq: Once | INTRAVENOUS | Status: AC | PRN
  Administered 2023-10-19: 75 mL via INTRAVENOUS

## 2023-10-19 MED ORDER — METOCLOPRAMIDE HCL 5 MG/ML IJ SOLN
10.0000 mg | Freq: Once | INTRAMUSCULAR | Status: AC
  Administered 2023-10-19: 10 mg via INTRAVENOUS
  Filled 2023-10-19: qty 2

## 2023-10-19 MED ORDER — ACETAMINOPHEN 325 MG PO TABS
650.0000 mg | ORAL_TABLET | Freq: Once | ORAL | Status: AC
  Administered 2023-10-19: 650 mg via ORAL
  Filled 2023-10-19: qty 2

## 2023-10-19 MED ORDER — STROKE: EARLY STAGES OF RECOVERY BOOK: Freq: Once | Status: AC

## 2023-10-19 MED ORDER — ENOXAPARIN SODIUM 30 MG/0.3ML IJ SOSY
30.0000 mg | PREFILLED_SYRINGE | INTRAMUSCULAR | Status: DC
  Administered 2023-10-19 – 2023-10-24 (×6): 30 mg via SUBCUTANEOUS
  Filled 2023-10-19 (×6): qty 0.3

## 2023-10-19 NOTE — ED Notes (Signed)
Pt refuses to have IV placed at this time.

## 2023-10-19 NOTE — Assessment & Plan Note (Signed)
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

## 2023-10-19 NOTE — ED Notes (Signed)
Pt brother Hessie Diener [phone]) at bedside

## 2023-10-19 NOTE — TOC CM/SW Note (Signed)
Transition of Care [facility]) - Inpatient Brief Assessment   Patient Details  Name: Rhonda Davenport MRN: [id number] Date of Birth: 23-Sep-1941  Transition of Care [facility]) CM/SW Contact:    Kemper Durie, RN Phone Number: 10/19/2023, 1:40 PM   Clinical Narrative:  Brief assessment done, admitted from [facility] for [facility], PT/OT evals pending. TOC will continue to follow.   Transition of Care Asessment: Insurance and Status: Insurance coverage has been reviewed Patient has primary care physician: Yes Home environment has been reviewed: [facility] Prior level of function:: ALF Prior/Current Home Services: No current home services Social Determinants of Health Reivew: SDOH reviewed no interventions necessary Readmission risk has been reviewed: Yes Transition of care needs: transition of care needs identified, TOC will continue to follow

## 2023-10-19 NOTE — H&P (Addendum)
History and Physical    Patient: Rhonda Davenport VWU:[id number] DOB: 03/27/1941 DOA: 10/18/2023 DOS: the patient was seen and examined on 10/19/2023 PCP: Andreas Blower., MD  Patient coming from: ALF/ILF  Chief Complaint:  Chief Complaint  Patient presents with   Emesis   HPI: Rhonda Davenport is a 82 y.o. female with medical history significant of CVA/TIA, HFrEF with CAD followed by [facility] cardiology, chronic C1 dens fracture, Rheumatoid arthritis followed by Dr. Deanne Coffer locally, CKD, hypothyroidism presenting with CVA.  Patient reports acute onset of lower extremity weakness over the past 10 to 12 hours.  Hours.  Has baseline lower extremity weakness that seem to have gotten significantly worsened especially since yesterday.  Patient noted be resident of local assisted living facility.  Woke up around 1 AM with nausea and vomiting.  Reports severe weakness to the point of being unable to walk.  Noted chronic baseline weakness that seems to be acutely worsened.  No fevers or chills.  No nausea or vomiting.  No chest pain or shortness of breath.  Reports chronic back pain and weakness in the setting of chronic C1 fracture.  No orthopnea or PND.  No cough. Presented to the ER afebrile, hemodynamically stable.  Satting well on room air.  White count 8.7, hemoglobin 11.1, platelets 356, creatinine 1.1, troponin 20.  EKG normal sinus rhythm.  CT head within normal limits.  Noted interval worsening of resorption of the dens.  MRI of the C-spine recommended.  MRI C-spine showing chronic severe cervical spine degeneration around C5 and C6 with unhealed C1 ring fracture.  Positive degenerative stenosis from cervical medullary junction through T1-T2.  Also with subacute appearing right PICA cerebellar infarct with developing encephalomalacia and laminar necrosis. Review of Systems: As mentioned in the history of present illness. All other systems reviewed and are negative. Past Medical History:  Diagnosis  Date   Anemia    Chronic low back pain    Fall    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   GERD (gastroesophageal reflux disease)    H/O blood clots    pt states had blood clot in her rt arm   High cholesterol    Hypertension    Still's disease (HCC)    Thyroid disease    Hypothyroidism   Past Surgical History:  Procedure Laterality Date   ABDOMINAL HYSTERECTOMY     APPENDECTOMY     CARPAL TUNNEL RELEASE Bilateral    JOINT REPLACEMENT     TKR, left   SHOULDER SURGERY     Bilateral   TONSILLECTOMY     Social History:  reports that she quit smoking about 40 years ago. Her smoking use included cigarettes. She started smoking about 66 years ago. She has a 26 pack-year smoking history. She has never used smokeless tobacco. She reports that she does not drink alcohol and does not use drugs.  Allergies  Allergen Reactions   Ibuprofen Itching   Oxycodone     Other reaction(s): Lethargy (intolerance), Malaise (intolerance) "sleeps for 2 days"   Tramadol Itching   Atorvastatin     Other reaction(s): Myalgias (intolerance)   Ceftriaxone Other (See Comments)   Benztropine Rash   Codeine Palpitations   Diphenhydramine Hcl Itching    Tolerates ATARAX   Hydrocodone-Acetaminophen Rash   Leflunomide Rash   Meloxicam Itching    Family History  Problem Relation Age of Onset   Rheum arthritis Mother    Rheum arthritis Father    Healthy Brother  Prior to Admission medications   Medication Sig Start Date End Date Taking? Authorizing Provider  acetaminophen (TYLENOL) 325 MG tablet Take 325 mg by mouth every 6 (six) hours as needed. 04/13/16   [provider]  amLODipine (NORVASC) 2.5 MG tablet Take 2.5 mg by mouth daily.    [provider]  busPIRone (BUSPAR) 10 MG tablet Take 15 mg by mouth 2 (two) times daily.    [provider]  calcium carbonate (TUMS - DOSED IN MG ELEMENTAL CALCIUM) 500 MG chewable tablet Chew 1 tablet by mouth daily.    [provider]   clopidogrel (PLAVIX) 75 MG tablet Take 1 tablet by mouth daily. 04/06/15   [provider]  ergocalciferol (VITAMIN D2) 50000 UNITS capsule Take 50,000 Units by mouth every Thursday.     [provider]  hydrOXYzine (ATARAX/VISTARIL) 25 MG tablet Take 25 mg by mouth at bedtime.    [provider]  levothyroxine (SYNTHROID, LEVOTHROID) 50 MCG tablet Take 50 mcg by mouth daily before breakfast.     [provider]  metoprolol tartrate 37.5 MG TABS Take 1 tablet (37.5 mg total) by mouth 2 (two) times daily. 04/09/23   Rhetta Mura, MD  ondansetron (ZOFRAN) 4 MG tablet Take 4 mg by mouth every 4 (four) hours as needed for nausea or vomiting.    [provider]  pantoprazole (PROTONIX) 40 MG tablet Take 40 mg by mouth 2 (two) times daily as needed (reflux).    [provider]  polyvinyl alcohol (LIQUIFILM TEARS) 1.4 % ophthalmic solution Place 1 drop into both eyes 3 (three) times daily as needed for dry eyes.    [provider]  pravastatin (PRAVACHOL) 20 MG tablet Take 40 mg by mouth daily.    [provider]  senna (SENOKOT) 8.6 MG TABS tablet Take 2 tablets by mouth at bedtime.    [provider]  Zinc Oxide (TRIPLE PASTE) 12.8 % ointment Apply 1 Application topically as needed for irritation. Every shift.    [provider]    Physical Exam: Vitals:   10/18/23 2118 10/19/23 0120 10/19/23 0520 10/19/23 0842  BP: 138/77 (!) 153/68 119/67 (!) 157/75  Pulse: 67 74 71 72  Resp: 14 16 16 20   Temp: 98.3 F (36.8 C) 97.9 F (36.6 C) 98.3 F (36.8 C) 98.6 F (37 C)  TempSrc: Oral Oral Oral   SpO2: 100% 100% 100% 100%  Weight:    38.4 kg  Height:    4\' 7"  (1.397 m)   Physical Exam Constitutional:      Comments: Underweight/cachectic  HENT:     Head: Normocephalic.     Nose: Nose normal.  Eyes:     Pupils: Pupils are equal, round, and reactive to light.  Cardiovascular:     Rate and Rhythm:  Normal rate and regular rhythm.  Pulmonary:     Effort: Pulmonary effort is normal.  Abdominal:     General: Bowel sounds are normal.  Musculoskeletal:     Comments: Mild upper extremity contractures  Skin:    General: Skin is warm and dry.  Neurological:     Comments: Positive lower extremity weakness otherwise grossly nonfocal neuroexam  Psychiatric:        Mood and Affect: Mood normal.     Data Reviewed:  There are no new results to review at this time.  MR Cervical Spine W and Wo Contrast CLINICAL DATA:  82 year old female with trauma, neck pain. Chronic C1 fracture.  Abnormal dens on cervical spine CT.  EXAM: MRI CERVICAL SPINE WITHOUT AND WITH CONTRAST  TECHNIQUE: Multiplanar and multiecho pulse sequences of the cervical spine, to include the craniocervical junction and cervicothoracic junction, were obtained without and with intravenous contrast.  CONTRAST:  3mL GADAVIST GADOBUTROL 1 MMOL/ML IV SOLN  COMPARISON:  CT cervical spine yesterday. CTA head and neck 04/05/2023.  Cervical spine MRI 03/26/2014.  Cervical spine CT 03/21/2014.  FINDINGS: Alignment: Chronically abnormal. Reversal of the normal cervical lordosis and up to 5 mm of anterolisthesis of C4 on C5 was present in 2015. Chronic anterolisthesis at C3-C4. Alignment at those levels not significantly changed since that time. Chronically progressed anterolisthesis of C7 on T1.  Abnormal C1-C2 alignment, further detailed below.  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 Partial collapse of those levels since 2015. Similar partial collapse of C4 and C7 since that time. Development of C2-C3 ankylosis since 2015. And similar partial collapse of multiple visible upper thoracic vertebral bodies.  Early erosion of the odontoid in 2015, progressed and now severe.  Large pannus about the odontoid now. Chronic anterior and posterior unhealed C1 ring fracture  is new since 2015, 1st identified in April this year, and contributes to abnormal C1-C2 alignment.  Underlying normal background bone marrow signal. Marrow edema is visible at the C1 ring, and odontoid. But no other marrow edema identified in the cervical or upper thoracic levels.  Cord: Multilevel abnormal cervical spinal cord mass effect. But no cord edema or myelomalacia is identified. No abnormal intradural enhancement. Mild posterior cervical dural thickening and enhancement appears to be degenerative in nature.  Posterior Fossa, vertebral arteries, paraspinal tissues: No signal abnormality at the cervicomedullary junction. T2 heterogeneity in the pons compatible with chronic small vessel disease. Encephalomalacia in the right cerebellum PICA territory with intrinsic T1 hyperintensity but also some enhancement. Normal basilar cisterns above the cervicomedullary junction.  Preserved major vascular flow voids in the neck. Tortuous vertebral arteries, the left appears to be dominant.  There is mild T2 and STIR hyperintense signal abnormality of the interspinous ligament at C7-T1. No prevertebral fluid. No other definite signal abnormality in the cervical spine ligamentous complex or in the cervical paraspinal soft tissues.  Disc levels:  C1-C2: Bulky and partially enhancing pannus surrounding the odontoid with un healed C1 fracture and mild displacement. Superimposed anterior and posterior ligamentous hypertrophy. Spinal stenosis at the cervicomedullary junction with up to mild mass effect there.  C2-C3:  Degenerative ankylosis.  No spinal stenosis.  C3-C4: Chronic anterolisthesis. Bulky ligamentous hypertrophy. Mild spinal stenosis with up to mild cord mass effect.  C4-C5: Pronounced chronic anterolisthesis. Severe ligament flavum hypertrophy superimposed. Moderate spinal stenosis and spinal cord mass effect (series 8, image 15). No cord signal abnormality.  C5-C6:  Chronic or congenital ankylosis. Mild ligamentous hypertrophy. Mild spinal stenosis. Mild if any cord mass effect.  C6-C7:  Chronic degeneration without spinal stenosis.  C7-T1: Chronic anterolisthesis. Bulky ligament flavum hypertrophy but no spinal stenosis.  Similar extensive visible thoracic spine degeneration. There is mild multifactorial upper thoracic spinal stenosis at T1-T2 (series 8, image 29) with bulky ligament flavum hypertrophy and mild if any spinal stenosis. No other upper thoracic spinal stenosis through the T7-T8 level.  IMPRESSION: 1. 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  2. Marrow edema in the C1 ring and associated with progressive chronic erosion of  the odontoid which appears related to degenerative versus inflammatory soft tissue pannus there. Widespread partial collapse of cervical and upper thoracic vertebral bodies since 2015, but appears chronic with no other marrow edema identified.  3. Evidence of C7-T1 interspinous ligament injury with mild signal abnormality there.  4. Degenerative stenosis from the cervicomedullary junction through T1-T2. Intermittent mass effect on the cervicomedullary junction and spinal cord, up to moderate at the C4-C5 level. But no cord edema or myelomalacia identified. No evidence of cord contusion.  5. Subacute appearing Right PICA Cerebellar Infarct. Developing encephalomalacia, laminar necrosis.  Electronically Signed   By: Odessa Fleming M.D.   On: 10/19/2023 05:04 CT ABDOMEN PELVIS W CONTRAST CLINICAL DATA:  Nausea, vomiting, loss of appetite, weakness.  EXAM: CT ABDOMEN AND PELVIS WITH CONTRAST  TECHNIQUE: Multidetector CT imaging of the abdomen and pelvis was performed using the standard protocol following bolus administration of intravenous  contrast.  RADIATION DOSE REDUCTION: This exam was performed according to the departmental dose-optimization program which includes automated exposure control, adjustment of the mA and/or kV according to patient size and/or use of iterative reconstruction technique.  CONTRAST:  75mL OMNIPAQUE IOHEXOL 300 MG/ML  SOLN  COMPARISON:  CT abdomen and pelvis 12/11/2022  FINDINGS: Lower chest: No acute abnormality.  Hepatobiliary: No focal liver abnormality is seen. No gallstones, gallbladder wall thickening, or biliary dilatation.  Pancreas: Unremarkable.  Spleen: Unremarkable.  Adrenals/Urinary Tract: Stable adrenal glands. Cortical renal scarring in both kidneys. No urinary calculi or hydronephrosis. Unremarkable bladder.  Stomach/Bowel: Normal caliber large and small bowel. Colonic diverticulosis without diverticulitis. Moderate colonic stool burden. The appendix is not definitively visualized. Moderate-to-large hiatal hernia containing a portion of the stomach in the chest.  Vascular/Lymphatic: Aortic atherosclerosis. No enlarged abdominal or pelvic lymph nodes.  Reproductive: Hysterectomy.  Other: No free intraperitoneal fluid or air.  Musculoskeletal: No acute fracture. Posterior fusion L3-S1. Grade 1-2 anterolisthesis of L4. Multilevel advanced spondylosis and disc space height loss with degenerative endplate changes.  IMPRESSION: 1. No acute abnormality in the abdomen or pelvis. 2. Moderate colonic stool burden. 3. Moderate-to-large hiatal hernia containing a portion of the stomach in the chest. 4.  Colonic diverticulosis without diverticulitis.  Aortic Atherosclerosis (ICD10-I70.0).  Electronically Signed   By: Minerva Fester M.D.   On: 10/19/2023 02:22 CT Cervical Spine Wo Contrast CLINICAL DATA:  Head trauma, minor (Age >= 65y); Neck trauma (Age >= 65y)  Pt reprots woke up this morning at 1am feeling nausea and vomtied several times.  EXAM: CT HEAD  WITHOUT CONTRAST  CT CERVICAL SPINE WITHOUT CONTRAST  TECHNIQUE: Multidetector CT imaging of the head and cervical spine was performed following the standard protocol without intravenous contrast. Multiplanar CT image reconstructions of the cervical spine were also generated.  RADIATION DOSE REDUCTION: This exam was performed according to the departmental dose-optimization program which includes automated exposure control, adjustment of the mA and/or kV according to patient size and/or use of iterative reconstruction technique.  COMPARISON:  CT angio head and neck 04/05/2023, MRI cervical spine 03/26/2014  FINDINGS: CT HEAD FINDINGS  Brain:  Patchy and confluent areas of decreased attenuation are noted throughout the deep and periventricular white matter of the cerebral hemispheres bilaterally, compatible with chronic microvascular ischemic disease. No evidence of large-territorial acute infarction. No parenchymal hemorrhage. No mass lesion. No extra-axial collection.  No mass effect or midline shift. No hydrocephalus. Basilar cisterns are patent.  Vascular: No hyperdense vessel.  Skull: No acute fracture or focal lesion.  Sinuses/Orbits: Persistent complete  opacification visualized left maxillary sinus with associated sinus wall hypertrophy and intraluminal calcifications likely chronic or fungal sinusitis. Paranasal sinuses and mastoid air cells are clear. The orbits are unremarkable.  Other: None.  CT CERVICAL SPINE FINDINGS  Alignment: Interval worsening of a known grade 2 anterolisthesis of C4 on C5 with 6 mm (from 4 mm) anterolisthesis. Interval worsening of grade 1 anterolisthesis of C3 on C4 with 4 mm (from 2 mm) anterolisthesis.  Skull base and vertebrae: Severe multilevel degenerative changes of the spine. Severe bilateral C4-C5 osseous neural foraminal stenosis. No severe osseous central canal stenosis. Interval worsening of severe resorption of the  dens. Interval new resorption of the right aspect of the C1 posterior arch (2:35). 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 No acute fracture. No aggressive appearing focal osseous lesion or focal pathologic process.  Soft tissues and spinal canal: No prevertebral fluid or swelling. No visible canal hematoma.  Upper chest: Unremarkable.  Other: Atherosclerotic plaque of the aortic arch and its branches.  IMPRESSION: 1.  No acute intracranial abnormality. 2. No acute displaced fracture or traumatic listhesis of the cervical spine. 3. Interval worsening of severe resorption of the dens. Interval new resorption of the right aspect of the C1 posterior arch. Concern for underlying infection. Recommend MRI cervical spine for further evaluation. 4. Interval worsening of a known grade 2 anterolisthesis of C4 on C5 with 6 mm (from 4 mm) anterolisthesis. Interval worsening of grade 1 anterolisthesis of C3 on C4 with 4 mm (from 2 mm) anterolisthesis. Multilevel severe degenerative changes of the spine. This can further be evaluated on MRI. 5. Severe bilateral C4-C5 osseous neural foraminal stenosis. 6.  Aortic Atherosclerosis (ICD10-I70.0).  Electronically Signed   By: Tish Frederickson M.D.   On: 10/19/2023 01:31 CT HEAD WO CONTRAST ( ) CLINICAL DATA:  Head trauma, minor (Age >= 65y); Neck trauma (Age >= 65y)  Pt reprots woke up this morning at 1am feeling nausea and vomtied several times.  EXAM: CT HEAD WITHOUT CONTRAST  CT CERVICAL SPINE WITHOUT CONTRAST  TECHNIQUE: Multidetector CT imaging of the head and cervical spine was performed following the standard protocol without intravenous contrast. Multiplanar CT image reconstructions of the cervical spine were also generated.  RADIATION DOSE REDUCTION: This exam was performed according to the departmental dose-optimization program which includes automated exposure control, adjustment of the mA and/or kV according  to patient size and/or use of iterative reconstruction technique.  COMPARISON:  CT angio head and neck 04/05/2023, MRI cervical spine 03/26/2014  FINDINGS: CT HEAD FINDINGS  Brain:  Patchy and confluent areas of decreased attenuation are noted throughout the deep and periventricular white matter of the cerebral hemispheres bilaterally, compatible with chronic microvascular ischemic disease. No evidence of large-territorial acute infarction. No parenchymal hemorrhage. No mass lesion. No extra-axial collection.  No mass effect or midline shift. No hydrocephalus. Basilar cisterns are patent.  Vascular: No hyperdense vessel.  Skull: No acute fracture or focal lesion.  Sinuses/Orbits: Persistent complete opacification visualized left maxillary sinus with associated sinus wall hypertrophy and intraluminal calcifications likely chronic or fungal sinusitis. Paranasal sinuses and mastoid air cells are clear. The orbits are unremarkable.  Other: None.  CT CERVICAL SPINE FINDINGS  Alignment: Interval worsening of a known grade 2 anterolisthesis of C4 on C5 with 6 mm (from 4 mm) anterolisthesis. Interval worsening of grade 1 anterolisthesis of C3 on C4 with 4 mm (from 2 mm) anterolisthesis.  Skull base and vertebrae: Severe multilevel degenerative changes of the spine.  Severe bilateral C4-C5 osseous neural foraminal stenosis. No severe osseous central canal stenosis. Interval worsening of severe resorption of the dens. Interval new resorption of the right aspect of the C1 posterior arch (2:35). 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 No acute fracture. No aggressive appearing focal osseous lesion or focal pathologic process.  Soft tissues and spinal canal: No prevertebral fluid or swelling. No visible canal hematoma.  Upper chest: Unremarkable.  Other: Atherosclerotic plaque of the aortic arch and its branches.  IMPRESSION: 1.  No acute intracranial  abnormality. 2. No acute displaced fracture or traumatic listhesis of the cervical spine. 3. Interval worsening of severe resorption of the dens. Interval new resorption of the right aspect of the C1 posterior arch. Concern for underlying infection. Recommend MRI cervical spine for further evaluation. 4. Interval worsening of a known grade 2 anterolisthesis of C4 on C5 with 6 mm (from 4 mm) anterolisthesis. Interval worsening of grade 1 anterolisthesis of C3 on C4 with 4 mm (from 2 mm) anterolisthesis. Multilevel severe degenerative changes of the spine. This can further be evaluated on MRI. 5. Severe bilateral C4-C5 osseous neural foraminal stenosis. 6.  Aortic Atherosclerosis (ICD10-I70.0).  Electronically Signed   By: Tish Frederickson M.D.   On: 10/19/2023 01:31  Lab Results  Component Value Date   WBC 8.7 10/18/2023   HGB 11.1 (L) 10/18/2023   HCT 33.5 (L) 10/18/2023   MCV 84.2 10/18/2023   PLT 356 10/18/2023   Last metabolic panel Lab Results  Component Value Date   GLUCOSE 109 (H) 10/18/2023   NA 135 10/18/2023   K 3.9 10/18/2023   CL 96 (L) 10/18/2023   CO2 25 10/18/2023   BUN 28 (H) 10/18/2023   CREATININE 1.09 (H) 10/18/2023   GFRNONAA 50 (L) 10/18/2023   CALCIUM 10.1 10/18/2023   PHOS 2.9 03/24/2014   PROT 8.0 10/18/2023   ALBUMIN 4.2 10/18/2023   BILITOT 0.5 10/18/2023   ALKPHOS 46 10/18/2023   AST 18 10/18/2023   ALT 7 10/18/2023   ANIONGAP 14 10/18/2023    Assessment and Plan: Leg weakness, bilateral Noted worsening new onset lower extremity weakness MRI of the C-spine showing Subacute appearing Right PICA Cerebellar Infarct  Noted baseline history of TIAs on Plavix Will plan for formal CVA evaluation including MRI of the brain, 2D echo, risk stratification labs Neurology formally consulted Follow-up recommendations  C1 cervical fracture (HCC) Noted chronic C1 dens fracture and 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 Marianna Fuss cardiology  Appears euvolemic to clinically dry Continue home regimen for now Monitor  Chronic kidney disease (CKD) stage G3b/A3, moderately decreased glomerular filtration rate (GFR) between 30-44 mL/min/1.73 square meter and albuminuria creatinine ratio greater than 300 mg/g (HCC) Creatinine 1.1 with GFR in the 50s Monitor   Greater than 50% was spent in counseling and coordination of care with patient Total encounter time 80 minutes or more     Advance Care Planning:   Code Status: Full Code   Consults:  Neurology  Family Communication: Brother at the bedside   Severity of Illness: The appropriate patient status for this patient is INPATIENT. Inpatient status is judged to be reasonable and necessary in order to provide the required intensity of service to ensure the patient's safety. The patient's presenting symptoms, physical exam findings, and initial radiographic and laboratory data in the context of their chronic comorbidities is felt to place them at high risk for further clinical deterioration. Furthermore, it is not anticipated that the patient will be medically stable for discharge from the hospital within 2 midnights of admission.   * I certify that at the point of admission it is my clinical judgment that the patient will require inpatient hospital care spanning beyond 2 midnights from the point of  admission due to high intensity of service, high risk for further deterioration and high frequency of surveillance required.*  Author: Floydene Flock, MD 10/19/2023 9:54 AM  For on call review [email or web address].

## 2023-10-19 NOTE — ED Notes (Signed)
Dr. Alvester Morin at bedside. Transport waiting to take pt

## 2023-10-19 NOTE — Consult Note (Signed)
NEUROLOGY CONSULT NOTE   Date of service: October 19, 2023 Patient Name: Rhonda Davenport MRN:  [id number] DOB:  08/28/1941 Chief Complaint: "Difficulty walk" Requesting Provider: Floydene Flock, MD  History of Present Illness  Rhonda Davenport is a 82 y.o. 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 who presents with difficulty walking that started two nights ago.  She states that she woke up in the middle the night and felt the need to vomit, and when she tried to stand up she was unable to walk.  Since that time, she has noticed some improvement both in her walking and her nausea.  She does complain of a bifrontal headache, stating that this is typical of her migraines.     LKW: prior to bed 11/7 IV Thrombolysis:  No, out of window  Past History   Past Medical History:  Diagnosis Date   Anemia    Chronic low back pain    Fall    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   GERD (gastroesophageal reflux disease)    H/O blood clots    pt states had blood clot in her rt arm   High cholesterol    Hypertension    Still's disease (HCC)    Thyroid disease    Hypothyroidism    Past Surgical History:  Procedure Laterality Date   ABDOMINAL HYSTERECTOMY     APPENDECTOMY     CARPAL TUNNEL RELEASE Bilateral    JOINT REPLACEMENT     TKR, left   SHOULDER SURGERY     Bilateral   TONSILLECTOMY      Family History: Family History  Problem Relation Age of Onset   Rheum arthritis Mother    Rheum arthritis Father    Healthy Brother     Social History  reports that she quit smoking about 40 years ago. Her smoking use included cigarettes. She started smoking about 66 years ago. She has a 26 pack-year smoking history. She has never used smokeless tobacco. She reports that she does not drink alcohol and does not use  drugs.  Allergies  Allergen Reactions   Ibuprofen Itching   Oxycodone     Other reaction(s): Lethargy (intolerance), Malaise (intolerance) "sleeps for 2 days"   Tramadol Itching   Atorvastatin     Other reaction(s): Myalgias (intolerance)   Ceftriaxone Other (See Comments)   Benztropine Rash   Codeine Palpitations   Diphenhydramine Hcl Itching    Tolerates ATARAX   Hydrocodone-Acetaminophen Rash   Leflunomide Rash   Meloxicam Itching    Medications   Current Facility-Administered Medications:    [START ON 10/20/2023]  stroke: early stages of recovery book, , Does not apply, Once, Floydene Flock, MD   0.9 %  sodium chloride infusion, , Intravenous, Continuous, Floydene Flock, MD, Last Rate: 40 mL/hr at 10/19/23 1150, New Bag at 10/19/23 1150   acetaminophen (TYLENOL) tablet 650 mg, 650 mg, Oral, Q4H PRN **OR** acetaminophen (TYLENOL) 160 MG/5ML solution 650 mg, 650 mg, Per Tube, Q4H PRN **OR** acetaminophen (TYLENOL) suppository 650 mg, 650 mg, Rectal, Q4H PRN, Floydene Flock, MD   clopidogrel (PLAVIX) tablet 75 mg, 75 mg, Oral, Daily, Floydene Flock, MD   enoxaparin (LOVENOX) injection 30 mg, 30 mg, Subcutaneous, Q24H, Floydene Flock, MD   hydrOXYzine (ATARAX) tablet 25 mg, 25 mg, Oral, TID PRN, Doree Albee  J, MD   labetalol (NORMODYNE) injection 10 mg, 10 mg, Intravenous, Q2H PRN, Floydene Flock, MD   LORazepam (ATIVAN) injection 0.5 mg, 0.5 mg, Intravenous, Q4H PRN, Floydene Flock, MD   metoCLOPramide (REGLAN) injection 10 mg, 10 mg, Intravenous, Once, Amada Jupiter, Hardin Negus, MD   morphine (PF) 2 MG/ML injection 2 mg, 2 mg, Intravenous, Q4H PRN, Floydene Flock, MD   pantoprazole (PROTONIX) EC tablet 40 mg, 40 mg, Oral, BID PRN, Floydene Flock, MD   senna-docusate (Senokot-S) tablet 1 tablet, 1 tablet, Oral, QHS PRN, Floydene Flock, MD  Vitals   Vitals:   10/19/23 0120 10/19/23 0520 10/19/23 0842 10/19/23 1155  BP: (!) 153/68 119/67 (!) 157/75 (!)  153/79  Pulse: 74 71 72 73  Resp: 16 16 20 18   Temp: 97.9 F (36.6 C) 98.3 F (36.8 C) 98.6 F (37 C) 98.7 F (37.1 C)  TempSrc: Oral Oral    SpO2: 100% 100% 100% 99%  Weight:   38.4 kg   Height:   4\' 7"  (1.397 m)     Body mass index is 19.68 kg/m.  Physical Exam   Constitutional: Appears well-developed and well-nourished.   Neurologic Examination    Neuro: Mental Status: Patient is awake, alert, oriented to person, place, month, year, and situation. Patient is able to give a clear and coherent history. No signs of aphasia or neglect Cranial Nerves: II: Visual Fields are full. Pupils are equal, round, and reactive to light.   III,IV, VI: EOMI without ptosis or diploplia. She has saccadic smooth pursuit V: Facial sensation is symmetric to temperature VII: Facial movement is symmetric.  VIII: hearing is intact to voice X: Uvula elevates symmetrically XI: Shoulder shrug is symmetric. XII: tongue is midline without atrophy or fasciculations.  Motor: Tone is normal. Bulk is normal. 5/5 strength was present in all four extremities.  Sensory: Sensation is symmetric to light touch and temperature in the arms and legs.Cerebellar: She has ataxia in the right arm > leg.        Labs/Imaging/Neurodiagnostic studies   CBC:  Recent Labs  Lab 2023-11-09 1656  WBC 8.7  HGB 11.1*  HCT 33.5*  MCV 84.2  PLT 356    Basic Metabolic Panel:  Lab Results  Component Value Date   NA 135 Nov 09, 2023   K 3.9 Nov 09, 2023   CO2 25 2023-11-09   GLUCOSE 109 (H) 11/09/2023   BUN 28 (H) 2023/11/09   CREATININE 1.09 (H) 09-Nov-2023   CALCIUM 10.1 11-09-23   GFRNONAA 50 (L) 2023-11-09   GFRAA 50 (L) 12/20/2015    Lipid Panel: No results found for: "LDLCALC"  HgbA1c:  Lab Results  Component Value Date   HGBA1C 5.5 2023-11-09    Urine Drug Screen:     Component Value Date/Time   LABOPIA POSITIVE (A) 12/19/2015 1227   COCAINSCRNUR NONE DETECTED 12/19/2015 1227   LABBENZ  POSITIVE (A) 12/19/2015 1227   AMPHETMU NONE DETECTED 12/19/2015 1227   THCU NONE DETECTED 12/19/2015 1227   LABBARB NONE DETECTED 12/19/2015 1227     Alcohol Level No results found for: "ETH"  INR  Lab Results  Component Value Date   INR 1.08 12/25/2015    APTT  Lab Results  Component Value Date   APTT 31 01/08/2011    AED levels: No results found for: "PHENYTOIN", "ZONISAMIDE", "LAMOTRIGINE", "LEVETIRACETA"    MRI c-spine - cerebellar area of T2 change suspicious for storke  Impression   Gretna L Blass is a 82  y.o. 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  She presents with acute onset nausea, difficulty walking without clear weakness.  This is consistent with a cerebellar stroke seen on her MRI cervical spine.  She will need further workup.  Recommendations  - fasting lipid panel - MRI of the brain without contrast - Frequent neuro checks - Echocardiogram - CTA head and neck - Prophylactic therapy-Antiplatelet med: Aspirin - dose 81mg  and plavix 75mg  daily   - Risk factor modification - Telemetry monitoring - PT consult, OT consult, Speech consult  ______________________________________________________________________    Stormy Card [facility]

## 2023-10-19 NOTE — Assessment & Plan Note (Signed)
PPI ?

## 2023-10-19 NOTE — ED Notes (Signed)
Pt stood up from bed and pivoted to wheelchair/ pt provided UA sample/ pt given blankets and pillow

## 2023-10-19 NOTE — Progress Notes (Signed)
PHARMACIST - PHYSICIAN COMMUNICATION  CONCERNING:  Enoxaparin (Lovenox) for DVT Prophylaxis    RECOMMENDATION: Patient was prescribed enoxaparin 40mg  q24 hours for VTE prophylaxis.   Filed Weights   10/19/23 0842  Weight: 38.4 kg (84 lb 10.5 oz)    Body mass index is 19.68 kg/m.  Estimated Creatinine Clearance: 21 mL/min (A) (by C-G formula based on SCr of 1.09 mg/dL (H)).  Patient is candidate for enoxaparin 30mg  every 24 hours based on CrCl <76ml/min or Weight <45kg  DESCRIPTION: Pharmacy has adjusted enoxaparin dose per [facility] policy.  Patient is now receiving enoxaparin 30 mg every 24 hours   Tressie Ellis 10/19/2023 9:44 AM

## 2023-10-19 NOTE — Progress Notes (Signed)
SLP Cancellation Note  Patient Details Name: REET ALLAN MRN: [id number] DOB: 1941-01-09   Cancelled treatment:       Reason Eval/Treat Not Completed: SLP screened, no needs identified, will sign off (Chart review completed. NIHSS = 0. No observed changes to speech/language/cognition. Pt with subacute stroke per imaging. Resides in ALF. Recommend further assessment at next level of care in functional/home environment should cognitive-linguistic concerns arise.)  Clyde Canterbury, M.S., CCC-SLP Speech-Language Pathologist [facility] [phone] Arnette Felts)  Woodroe Chen 10/19/2023, 9:50 AM

## 2023-10-19 NOTE — ED Provider Notes (Signed)
Care of this patient assumed from prior physician at 2300 pending CTs and disposition. Please see prior physician note for further details.  Briefly this is an 82 year old female who presented with nausea and vomiting.  Recent fall.  Labs without critical derangements.  Stable anemia, mildly elevated lipase.  CT is pending at time of signout.  CT head without acute bleed.  No acute cervical spine fractures were noted, but radiology does note new resorption of part of her C1 posterior arch concerning for an underlying infection for which MRI was recommended.  CT abdomen pelvis was without acute findings.  Discussed results with patient.  MRI cervical spine with and without contrast ordered to further evaluate. MRI C-spine demonstrates significant degenerative changes of the cervical spine with some marrow edema and mass effect.  MRI also notes a subacute appearing right PICA cerebellar infarct.  Patient was reevaluated and updated on the results of her MRI including concern for recent stroke.  She does report that she has had some progressive weakness and since yesterday morning has been unable to walk.  Reports that she occasionally uses a wheelchair at baseline but is normally able to ambulate without difficulty.  Spoke with RN, patient required significant assistance to take a few steps just go to the bathroom.  On exam, the patient is keenly aware, correctly answers month and age, able to blink eyes and squeeze hands, normal horizontal extraocular movements, no visual field loss, normal facial symmetry, no arm or leg motor drift, no limb ataxia, normal sensation, no aphasia, no dysarthria, no inattention.  NIH 0.  However with clinical history of recent gait instability and concern for subacute infarct on MRI, will reach out to hospitalist to discuss admission.  Case discussed with Dr. Alvester Morin.  He will evaluate the patient for anticipated admission.    Trinna Post, MD 10/19/23 [phone]

## 2023-10-19 NOTE — Assessment & Plan Note (Signed)
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 Marianna Fuss cardiology  Appears euvolemic to clinically dry Continue home regimen for now Monitor

## 2023-10-19 NOTE — Assessment & Plan Note (Signed)
Noted worsening new onset lower extremity weakness MRI of the C-spine showing Subacute appearing Right PICA Cerebellar Infarct  Noted baseline history of TIAs on Plavix Will plan for formal CVA evaluation including MRI of the brain, 2D echo, risk stratification labs Neurology formally consulted Follow-up recommendations

## 2023-10-19 NOTE — Assessment & Plan Note (Signed)
Creatinine 1.1 with GFR in the 50s Monitor

## 2023-10-19 NOTE — Progress Notes (Signed)
  Echocardiogram 2D Echocardiogram has been performed.  Lenor Coffin 10/19/2023, 3:42 PM

## 2023-10-19 NOTE — Assessment & Plan Note (Addendum)
Allow for permissive hypertension in setting of CVA evaluation As needed IV labetalol for systolic pressure greater than 220 or diastolic pressure greater than 110

## 2023-10-19 NOTE — Evaluation (Signed)
Physical Therapy Evaluation Patient Details Name: Rhonda Davenport MRN: [id number] DOB: 1941/07/04 Today's Date: 10/19/2023  History of Present Illness  Rhonda Davenport is an 82 y.o. female who presented to the ED with c/o of nausea, vomiting, and recent fall. MRI showed Evidence of C7-T1 interspinous ligament injury as well as Subacute appearing Right PICA Cerebellar Infarct. PMH includes: rheumatoid arthritis, iron deficient anemia, GERD, gait disorder, Still's disease, CHF, CKD, seizure.  Clinical Impression  82 yo female s/p fall and reports impaired mobility since yesterday. Patient was living at ALF. She reports having personal care attendants who come 4 days a week to assist with cleaning/meal prep and helping her get ready for bed. Patient reports walking with RW prior to admittance but reports it was not a good one. She states she has not eaten in several days and has a splitting headache. Throughout evaluation she kept asking for pain meds and medication for her nerves. PT notified RN. Patient is mod I for bed mobility. She does demonstrate impulsivity and was quick to stand up with RW support. Once standing, she required min A for steadying. Patient ambulated in hallway x100 feet with youth size RW and min A for steadying. Patient demonstrates reciprocal gait pattern, but narrow base of support and mild veering/unsteadiness. She also ambulates with postural abnormality of right shoulder elevated and kyphosis. Patient demonstrates generalized weakness in BUE/BLE. Light touch sensation was intact. She would benefit from additional skilled PT Intervention to improve strength, balance and mobility.       If plan is discharge home, recommend the following: A little help with walking and/or transfers;A little help with bathing/dressing/bathroom;Assistance with cooking/housework;Direct supervision/assist for medications management;Direct supervision/assist for financial management;Assist for  transportation;Help with stairs or ramp for entrance   Can travel by private vehicle        Equipment Recommendations Other (comment) (needs youth size RW)  Recommendations for Other Services       Functional Status Assessment Patient has had a recent decline in their functional status and demonstrates the ability to make significant improvements in function in a reasonable and predictable amount of time.     Precautions / Restrictions Precautions Precautions: Fall Restrictions Weight Bearing Restrictions: No      Mobility  Bed Mobility Overal bed mobility: Modified Independent             General bed mobility comments: comes to sitting from near flat bed without physical assistance. able to scoot up in bed without difficulty and manages bedsheets    Transfers Overall transfer level: Needs assistance Equipment used: Rolling walker (2 wheels) Transfers: Sit to/from Stand Sit to Stand: Supervision, Contact guard assist           General transfer comment: Pt quick to stand up with RW support. Requires close supervision for safety and CGA for steadying once standing.    Ambulation/Gait Ambulation/Gait assistance: Min assist Gait Distance (Feet): 125 Feet Assistive device: Rolling walker (2 wheels) Gait Pattern/deviations: Step-through pattern, Narrow base of support Gait velocity: decreased     General Gait Details: pt ambulates with postural deviation of elevated right shoulder, thoracic kyphosis; ambulates with narrow base of support, reciprocal pattern but increased hip adduction noted on RLE requiring min A for safety/balance. mild veering observed requiring min A for steadying  Stairs            Wheelchair Mobility     Tilt Bed    Modified Rankin (Stroke Patients Only)  Balance Overall balance assessment: Needs assistance Sitting-balance support: Feet supported, No upper extremity supported Sitting balance-Leahy Scale: Fair Sitting  balance - Comments: able to shift weight, reaching down towards feet without loss of balance. impulsive requiring close supervision for safety   Standing balance support: Bilateral upper extremity supported, During functional activity Standing balance-Leahy Scale: Fair Standing balance comment: Intermittent assist required to safely amb with RW; pt impulsive                             Pertinent Vitals/Pain Pain Assessment Pain Assessment: 0-10 Pain Score: 8  Pain Location: headache Pain Descriptors / Indicators: Headache Pain Intervention(s): Limited activity within patient's tolerance, Patient requesting pain meds-RN notified    Home Living Family/patient expects to be discharged to:: Assisted living       Home Access: Level entry       Home Layout: One level Home Equipment: Agricultural consultant (2 wheels);Rollator (4 wheels);Shower seat;Grab bars - tub/shower;Grab bars - toilet Additional Comments: Pt reports PCAs are with her at least 4 days/week. Has assistance with bathing, dressing, meal prep, etc.    Prior Function Prior Level of Function : Independent/Modified Independent             Mobility Comments: Mod I using rollator; recent falls ADLs Comments: Reports she tries to stay as independent as possible. Has PRN assist from ALF staff. Enjoys caring for her small dog "Lucky".     Extremity/Trunk Assessment   Upper Extremity Assessment Upper Extremity Assessment: Generalized weakness    Lower Extremity Assessment Lower Extremity Assessment: Generalized weakness (denies any numbness/tingling; demonstrates functional gross strength 3/5 bilaterally)    Cervical / Trunk Assessment Cervical / Trunk Assessment: Other exceptions (Tends to walk with R shoulder hiked, L shoulder down, pt reports this is new since admission. Not significantly functionally limiting and pt is able to correct with cueing.)  Communication   Communication Communication: No apparent  difficulties Cueing Techniques: Verbal cues;Gestural cues  Cognition Arousal: Alert Behavior During Therapy: Anxious, WFL for tasks assessed/performed Overall Cognitive Status: Within Functional Limits for tasks assessed                                 General Comments: Pt is oriented to self, place, and situation. Anxious re: recent health issues but re-directable t/o session. Benefits from frequent reassurance and encouragement.        General Comments      Exercises     Assessment/Plan    PT Assessment Patient needs continued PT services  PT Problem List Decreased strength;Decreased coordination;Decreased activity tolerance;Decreased balance;Decreased safety awareness;Decreased mobility       PT Treatment Interventions DME instruction;Balance training;Gait training;Neuromuscular re-education;Functional mobility training;Therapeutic activities;Therapeutic exercise;Patient/family education    PT Goals (Current goals can be found in the Care Plan section)  Acute Rehab PT Goals Patient Stated Goal: to return to ALF PT Goal Formulation: With patient Time For Goal Achievement: 11/02/23 Potential to Achieve Goals: Fair    Frequency 7X/week     Co-evaluation               AM-PAC PT "6 Clicks" Mobility  Outcome Measure Help needed turning from your back to your side while in a flat bed without using bedrails?: None Help needed moving from lying on your back to sitting on the side of a flat bed without using bedrails?: None Help  needed moving to and from a bed to a chair (including a wheelchair)?: A Little Help needed standing up from a chair using your arms (e.g., wheelchair or bedside chair)?: None Help needed to walk in hospital room?: A Little Help needed climbing 3-5 steps with a railing? : A Lot 6 Click Score: 20    End of Session Equipment Utilized During Treatment: Gait belt Activity Tolerance: Patient limited by pain Patient left: in bed;with  call bell/phone within reach;with bed alarm set Nurse Communication: Mobility status;Patient requests pain meds PT Visit Diagnosis: Unsteadiness on feet (R26.81);Other abnormalities of gait and mobility (R26.89)    Time: 4098-1191 PT Time Calculation (min) (ACUTE ONLY): 17 min   Charges:   PT Evaluation $PT Eval Low Complexity: 1 Low   PT General Charges $$ ACUTE PT VISIT: 1 Visit          Brittany Osier PT, DPT 10/19/2023, 2:57 PM

## 2023-10-19 NOTE — Assessment & Plan Note (Signed)
Not on DMARD therapy

## 2023-10-19 NOTE — Evaluation (Signed)
Occupational Therapy Evaluation Patient Details Name: Rhonda Davenport MRN: [id number] DOB: 1941/01/23 Today's Date: 10/19/2023   History of Present Illness Rhonda Davenport is an 82 y.o. female who presented to the ED with c/o of nausea, vomiting, and recent fall. MRI showed Evidence of C7-T1 interspinous ligament injury as well as Subacute appearing Right PICA Cerebellar Infarct. PMH includes: rheumatoid arthritis, iron deficient anemia, GERD, gait disorder, Still's disease, CHF, CKD, seizure.   Clinical Impression   [name] was seen for OT evaluation this date. Prior to hospital admission, pt required intermittent assistance for ADL management, but states she is generally MOD I for functional mobility. Pt lives in a ALF with staff available to assist PRN and family living nearby. Pt presents to acute OT demonstrating impaired ADL performance and functional mobility 2/2 generalized weakness, decreased balance, & decreased safety awareness (See OT problem list for additional functional deficits). Pt currently requires MIN A for functional mobility at household distances and MIN A for LB dressing while sitting EOB.  Pt would benefit from skilled OT services to address noted impairments and functional limitations (see below for any additional details) in order to maximize safety and independence while minimizing falls risk and caregiver burden. Anticipate the need for follow up OT services upon acute hospital DC.        If plan is discharge home, recommend the following: A little help with walking and/or transfers;A little help with bathing/dressing/bathroom;Assistance with cooking/housework;Assist for transportation;Help with stairs or ramp for entrance    Functional Status Assessment  Patient has had a recent decline in their functional status and demonstrates the ability to make significant improvements in function in a reasonable and predictable amount of time.  Equipment Recommendations  None  recommended by OT    Recommendations for Other Services       Precautions / Restrictions Precautions Precautions: Fall Restrictions Weight Bearing Restrictions: No      Mobility Bed Mobility Overal bed mobility: Modified Independent             General bed mobility comments: comes to sitting from near flat bed without physical assistance.    Transfers Overall transfer level: Needs assistance Equipment used: Rolling walker (2 wheels) Transfers: Sit to/from Stand, Bed to chair/wheelchair/BSC Sit to Stand: Min assist, Contact guard assist     Step pivot transfers: Contact guard assist     General transfer comment: Intermittent MIN A during functional mobility in room with RW. Consistent cueing for safe RW use t/o session.      Balance Overall balance assessment: Needs assistance Sitting-balance support: Feet supported, No upper extremity supported Sitting balance-Leahy Scale: Fair Sitting balance - Comments: Increased posterior lean/LOB noted with weight shift during LB ADL management   Standing balance support: Reliant on assistive device for balance, During functional activity, Bilateral upper extremity supported Standing balance-Leahy Scale: Fair Standing balance comment: Intermittent assist required to safely amb in room with RW.                           ADL either performed or assessed with clinical judgement   ADL Overall ADL's : Needs assistance/impaired                                     Functional mobility during ADLs: Minimal assistance;Contact guard assist;Rolling walker (2 wheels);Cueing for safety General ADL Comments: Pt functionally limited by  decreased strength and sensation in BLE. She endorses "they do what they want and not what I tell them". Pt mildly impulsive with mobiltiy tasks, requires cueing for safety t/o session. Initermittent MIN A as pt endorses leg weakness with amb using RW. Generally able to perform HH  distance functional transfers with CGA for safety. MIN A to adjust bilat socks as pt noted with increased posterior LOB during LB ADL mgt while sitting EOB.     Vision Baseline Vision/History: 1 Wears glasses Ability to See in Adequate Light: 1 Impaired Patient Visual Report: No change from baseline       Perception         Praxis         Pertinent Vitals/Pain Pain Assessment Pain Assessment: 0-10 Pain Score: 6  Pain Location: neck Pain Descriptors / Indicators: Aching, Sore Pain Intervention(s): Limited activity within patient's tolerance, Monitored during session, Patient requesting pain meds-RN notified     Extremity/Trunk Assessment Upper Extremity Assessment Upper Extremity Assessment: Generalized weakness (LUE marginally weaker than R but no significant focal weakness/sensory loss appreciated. Pt reports baseline bilat shoulder issues. Overall BUE WFL)   Lower Extremity Assessment Lower Extremity Assessment: Generalized weakness;Defer to PT evaluation (Pt endorses generalized decreased function in BUE but does not elaborate on specifics. Strength and ROM appear grossly WFL. Mild ataxic movements appreciated with bed mobility/functional mobility.)   Cervical / Trunk Assessment Cervical / Trunk Assessment: Other exceptions (Tends to walk with R shoulder hiked, L shoulder down, pt reports this is new since admission. Not significantly functionally limiting and pt is able to correct with cueing.)   Communication Communication Communication: No apparent difficulties Cueing Techniques: Verbal cues;Gestural cues   Cognition Arousal: Alert Behavior During Therapy: Anxious, WFL for tasks assessed/performed Overall Cognitive Status: Within Functional Limits for tasks assessed                                 General Comments: Pt is oriented to self, place, and situation. Anxious re: recent health issues but re-directable t/o session. Benefits from frequent  reassurance and encouragement.     General Comments       Exercises Other Exercises Other Exercises: Pt educated on role of OT in acute setting, falls prevention strategies, DC recs, and considerations for recovery after CVA.   Shoulder Instructions      Home Living Family/patient expects to be discharged to:: Assisted living       Home Access: Level entry     Home Layout: One level     Bathroom Shower/Tub: Producer, television/film/video: Standard     Home Equipment: Agricultural consultant (2 wheels);Rollator (4 wheels);Shower seat;Grab bars - tub/shower;Grab bars - toilet   Additional Comments: Pt reports PCAs are with her at least 4 days/week. Has assistance with bathing, dressing, meal prep, etc.      Prior Functioning/Environment Prior Level of Function : Independent/Modified Independent             Mobility Comments: Mod I using rollator; recent falls ADLs Comments: Reports she tries to stay as independent as possible. Has PRN assist from ALF staff. Enjoys caring for her small dog "Lucky".        OT Problem List: Decreased strength;Decreased range of motion;Decreased safety awareness;Decreased knowledge of use of DME or AE;Decreased activity tolerance;Impaired balance (sitting and/or standing);Decreased coordination      OT Treatment/Interventions: Self-care/ADL training;Therapeutic exercise;Therapeutic activities;Neuromuscular education;Energy conservation;DME and/or  AE instruction;Balance training;Patient/family education    OT Goals(Current goals can be found in the care plan section) Acute Rehab OT Goals Patient Stated Goal: To go home OT Goal Formulation: With patient Time For Goal Achievement: 11/02/23 Potential to Achieve Goals: Good ADL Goals Pt Will Perform Grooming: standing;with modified independence;with adaptive equipment Pt Will Perform Lower Body Dressing: sit to/from stand;with modified independence;with adaptive equipment Pt Will Transfer to  Toilet: with modified independence;ambulating;regular height toilet;bedside commode Pt Will Perform Toileting - Clothing Manipulation and hygiene: sit to/from stand;with modified independence;with caregiver independent in assisting;with adaptive equipment  OT Frequency: Min 1X/week    Co-evaluation              AM-PAC OT "6 Clicks" Daily Activity     Outcome Measure Help from another person eating meals?: None Help from another person taking care of personal grooming?: A Little Help from another person toileting, which includes using toliet, bedpan, or urinal?: A Little Help from another person bathing (including washing, rinsing, drying)?: A Lot Help from another person to put on and taking off regular upper body clothing?: A Little Help from another person to put on and taking off regular lower body clothing?: A Lot 6 Click Score: 17   End of Session Equipment Utilized During Treatment: Gait belt;Rolling walker (2 wheels) Nurse Communication: Mobility status;Patient requests pain meds  Activity Tolerance: Patient tolerated treatment well Patient left: in chair  OT Visit Diagnosis: Other abnormalities of gait and mobility (R26.89);History of falling (Z91.81);Other symptoms and signs involving the nervous system (R29.898)                Time: 4098-1191 OT Time Calculation (min): 32 min Charges:  OT General Charges $OT Visit: 1 Visit OT Evaluation $OT Eval Moderate Complexity: 1 Mod OT Treatments $Self Care/Home Management : 8-22 mins  Rockney Ghee, M.S., OTR/L 10/19/23, 12:13 PM

## 2023-10-20 ENCOUNTER — Inpatient Hospital Stay: Payer: Medicare Other

## 2023-10-20 DIAGNOSIS — I6309 Cerebral infarction due to thrombosis of other precerebral artery: Secondary | ICD-10-CM | POA: Diagnosis not present

## 2023-10-20 DIAGNOSIS — I639 Cerebral infarction, unspecified: Secondary | ICD-10-CM | POA: Diagnosis not present

## 2023-10-20 LAB — ECHOCARDIOGRAM COMPLETE
AR max vel: 1.75 cm2
AV Peak grad: 7 mm[Hg]
Ao pk vel: 1.32 m/s
Area-P 1/2: 5.54 cm2
Height: 55 in
S' Lateral: 2.1 cm
Weight: 1354.51 [oz_av]

## 2023-10-20 LAB — LIPID PANEL
Cholesterol: 125 mg/dL (ref 0–200)
HDL: 48 mg/dL (ref >40)
LDL Cholesterol: 57 mg/dL (ref 0–99)
Total CHOL/HDL Ratio: 2.6 {ratio}
Triglycerides: 98 mg/dL (ref <150)
VLDL: 20 mg/dL (ref 0–40)

## 2023-10-20 MED ORDER — LACTULOSE 10 GM/15ML PO SOLN
10.0000 g | Freq: Two times a day (BID) | ORAL | Status: AC
  Administered 2023-10-20 – 2023-10-22 (×4): 10 g via ORAL
  Filled 2023-10-20 (×4): qty 30

## 2023-10-20 MED ORDER — ALUM & MAG HYDROXIDE-SIMETH 200-200-20 MG/5ML PO SUSP
30.0000 mL | ORAL | Status: DC | PRN
  Administered 2023-10-20 – 2023-10-24 (×3): 30 mL via ORAL
  Filled 2023-10-20 (×3): qty 30

## 2023-10-20 NOTE — Progress Notes (Signed)
NEUROLOGY CONSULT FOLLOW UP NOTE   Date of service: October 20, 2023 Patient Name: Rhonda Davenport MRN:  [id number] DOB:  1940-12-18  Brief HPI  Rhonda Davenport is a 82 y.o. female who presented with  unsteadniness and was found to have a cerebellar infarct.    Interval Hx/subjective   No significant changes Vitals   Vitals:   10/19/23 2046 10/19/23 2345 10/20/23 0456 10/20/23 0757  BP: (!) 147/76 (!) 146/84 137/81 (!) 131/98  Pulse: 99 96 (!) 103 (!) 107  Resp: 12 16 16 17   Temp: 98.2 F (36.8 C) 98.2 F (36.8 C) 97.6 F (36.4 C) 98.1 F (36.7 C)  TempSrc: Oral Oral  Oral  SpO2: 99% 100% 97% 97%  Weight:      Height:         Body mass index is 19.68 kg/m.  Physical Exam   General: In bed, NAD  Neurologic Examination   MS: Awake, alert, interactive and appropriate CN: EOMI, VFF Motor: She has good strength throughout, though mild discoordination on the right Sensory: Intact light touch  Labs and Diagnostic Imaging   CBC:  Recent Labs  Lab 10/18/23 1656  WBC 8.7  HGB 11.1*  HCT 33.5*  MCV 84.2  PLT 356    Basic Metabolic Panel:  Y8M 5.5 LDL 57 Echo pending CTA - possible vert dissection.    Impression   Rhonda Davenport is a 82 y.o. female with a history of high cholesterol, hypertension who presents with embolic appearing cerebellar stroke.  She has a history of superficial thrombophlebitis, and has had some left leg swelling therefore I think would be reasonable to do a DVT study.  There is an irregularity in the right V3 segment, which could represent either an acute or chronic dissection, but either way I suspect that this is very likely etiology.  She does not currently have any neck pain to suggest acute dissection.  I would favor dual antiplatelet therapy with repeat imaging to check for healing of it at a later date.  Recommendations  Aspirin 81 mg and Plavix 75 mg until repeat imaging  Continue current statin therapy as she is at  goal Follow-up echo Follow-up PT/OT recommendations No further recommendations if echo does not reveal any embolic source.  ______________________________________________________________________   Thank you for the opportunity to take part in the care of this patient. If you have any further questions, please contact the neurology consultation team on call. Updated oncall schedule is listed on AMION.  Signed,  Ritta Slot, MD [facility] [phone]  If 7pm- 7am, please page neurology on call as listed in AMION.

## 2023-10-20 NOTE — Progress Notes (Signed)
PROGRESS NOTE  Rhonda Davenport    DOB: 16-Mar-1941, 82 y.o.  ZHY:[id number]    Code Status: Full Code   DOA: 10/18/2023   LOS: 1   Brief hospital course  Rhonda Davenport is a 82 y.o. female with medical history significant of CVA/TIA, HFrEF with CAD followed by [facility] cardiology, chronic C1 dens fracture, Rheumatoid arthritis followed by Dr. Deanne Coffer locally, CKD, hypothyroidism presenting with CVA with LE weakness. Has baseline lower extremity weakness that seem to have gotten significantly worsened especially since yesterday. Reports chronic back pain and weakness in the setting of chronic C1 fracture. Presented to the ER afebrile, hemodynamically stable.  Satting well on room air.  White count 8.7, hemoglobin 11.1, platelets 356, creatinine 1.1, troponin 20.  EKG normal sinus rhythm.  CT head within normal limits.  Noted interval worsening of resorption of the dens.  MRI C-spine showing chronic severe cervical spine degeneration around C5 and C6 with unhealed C1 ring fracture.  Positive degenerative stenosis from cervical medullary junction through T1-T2.  Also with subacute appearing right PICA cerebellar infarct with developing encephalomalacia and laminar necrosis. Brain MRI: Evolving subacute right PICA distribution infarct.   10/20/23 -stable with improvement in LE weakness  Assessment & Plan  Principal Problem:   CVA (cerebral vascular accident) (HCC) Active Problems:   Leg weakness, bilateral   Chronic kidney disease (CKD) stage G3b/A3, moderately decreased glomerular filtration rate (GFR) between 30-44 mL/min/1.73 square meter and albuminuria creatinine ratio greater than 300 mg/g (HCC)   Chronic systolic heart failure (HCC)   Essential hypertension   GERD (gastroesophageal reflux disease)   Rheumatoid arthritis involving multiple joints (HCC)   C1 cervical fracture (HCC)  Leg weakness, bilateral Acute CVA Brain MRI: Evolving subacute right PICA distribution infarct.  Endorses  improvement in her LE weakness. 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 Marianna Fuss cardiology  Appears euvolemic to clinically dry Continue home regimen for now Monitor   Chronic kidney disease (CKD) stage G3b/A3, moderately decreased glomerular filtration rate (GFR) between 30-44 mL/min/1.73 square meter and albuminuria creatinine ratio greater than 300 mg/g (HCC) Creatinine 1.1 with GFR in the 50s Monitor  Body mass index is 19.68 kg/m.  VTE ppx: enoxaparin (LOVENOX) injection 30 mg Start: 10/19/23 2200  Diet:     Diet   Diet NPO time specified   Consultants: Neurology   Subjective 10/20/23    Rhonda Davenport reports doing well. Her leg weakness is improving. She is OK to go back to her facility.    Objective   Vitals:   10/19/23 1155 10/19/23 2046 10/19/23 2345 10/20/23 0456  BP: (!) 153/79 (!) 147/76 (!) 146/84 137/81  Pulse: 73 99 96 (!) 103  Resp: 18 12 16 16   Temp: 98.7 F (37.1 C) 98.2 F (36.8 C) 98.2 F (36.8 C) 97.6 F (36.4 C)  TempSrc:  Oral Oral   SpO2: 99% 99% 100%  97%  Weight:      Height:       No intake or output data in the 24 hours ending 10/20/23 0714 Filed Weights   10/19/23 0842  Weight: 38.4 kg    Physical Exam:  General: awake, alert, NAD HEENT: atraumatic, clear conjunctiva, anicteric sclera,  MMM, hearing grossly normal Respiratory: normal respiratory effort. Cardiovascular: quick capillary refill, normal S1/S2, RRR, no JVD, murmurs Nervous: A&O x3. Moving all extremities. normal speech Extremities: no edema, normal tone Skin: dry, intact, normal temperature, normal color. No rashes, lesions or ulcers on exposed skin Psychiatry: normal mood, congruent affect  Labs   I have personally reviewed the following labs and imaging studies CBC    Component Value Date/Time   WBC 8.7 10/18/2023 1656   RBC 3.98 10/18/2023 1656   HGB 11.1 (L) 10/18/2023 1656   HCT 33.5 (L) 10/18/2023 1656   PLT 356 10/18/2023 1656   MCV 84.2 10/18/2023 1656   MCH 27.9 10/18/2023 1656   MCHC 33.1 10/18/2023 1656   RDW 11.9 10/18/2023 1656   LYMPHSABS 2.1 04/05/2023 1039   MONOABS 1.0 04/05/2023 1039   EOSABS 0.2 04/05/2023 1039   BASOSABS 0.0 04/05/2023 1039      Latest Ref Rng & Units 10/18/2023    4:56 PM 04/05/2023   10:39 AM 12/11/2022   10:42 AM  BMP  Glucose 70 - 99 mg/dL 161  98  89   BUN 8 - 23 mg/dL 28  14  17    Creatinine 0.44 - 1.00 mg/dL 0.96  0.45  4.09   Sodium 135 - 145 mmol/L 135  136  137   Potassium 3.5 - 5.1 mmol/L 3.9  4.1  5.1   Chloride 98 - 111 mmol/L 96  105  101   CO2 22 - 32 mmol/L 25  21  26    Calcium 8.9 - 10.3 mg/dL 81.1  8.9  91.4     CT ANGIO HEAD NECK W WO CM  Result Date: 10/20/2023 CLINICAL DATA:  Follow-up examination for stroke. EXAM: CT ANGIOGRAPHY HEAD AND NECK WITH AND WITHOUT CONTRAST TECHNIQUE: Multidetector CT imaging of the head and neck was performed using the standard protocol during bolus administration of intravenous contrast. Multiplanar CT image reconstructions and MIPs were obtained to evaluate the vascular anatomy. Carotid stenosis measurements (when applicable) are obtained utilizing NASCET criteria, using the distal internal carotid diameter as the denominator. RADIATION DOSE REDUCTION: This exam was performed according to the departmental  dose-optimization program which includes automated exposure control, adjustment of the mA and/or kV according to patient size and/or use of iterative reconstruction technique. CONTRAST:  75mL OMNIPAQUE IOHEXOL 350 MG/ML SOLN COMPARISON:  Prior brain MRI from earlier the same day. FINDINGS: CT HEAD FINDINGS Brain: Age-related cerebral atrophy with moderate chronic microvascular ischemic disease. Evolving subacute right PICA distribution infarct again noted, better seen on prior brain MRI. No associated hemorrhage or mass effect. No other acute large vessel territory infarct. No other acute intracranial hemorrhage. No mass lesion or midline shift. Mild ventricular prominence related to global parenchymal volume loss without hydrocephalus. No extra-axial fluid collection. Vascular: No abnormal hyperdense vessel. Calcified atherosclerosis present at the skull base. Skull: Scalp soft tissues within normal limits.  Calvarium intact. Sinuses/Orbits: Globes and orbital soft tissues within normal limits. Chronic left maxillary sinusitis noted. Trace left mastoid effusion, of doubtful significance. Other: Prominent osteoarthritic changes with degenerative pannus formation noted about the C1-2 articulations. Resultant mild to moderate stenosis at the craniocervical junction. Review  of the MIP images confirms the above findings CTA NECK FINDINGS Aortic arch: Visualized aorta within normal limits for caliber. Standard branch pattern present at the aortic arch. Mild aortic atherosclerosis. Right carotid system: Right common and internal carotid arteries are tortuous without dissection or stenosis. Left carotid system: Left common and internal carotid arteries are tortuous without dissection. Mild atheromatous change about the left carotid bulb without hemodynamically significant stenosis. Vertebral arteries: Both vertebral arteries arise from subclavian arteries. Left vertebral artery dominant. Both vertebral arteries are  tortuous. The left vertebral artery is patent without stenosis or dissection. On the right, there is irregularity involving the distal right vertebral artery at the V3 segment (series 10, images 187, 185). Secondary long segment severe near occlusive stenosis at this level, best seen on coronal images (series 11, image 119, 113). 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 No visible raised dissection flap or intraluminal thrombus. Distally, the right vertebral artery is relatively normal in appearance as it enters the cranial vault. Skeleton: No worrisome osseous lesions. Severe degenerative spondylosis seen throughout the cervical spine, described on recent cervical spine MRI. Chronic C1 fracture again noted. Other neck: No other acute finding. Mild asymmetric fullness at the left nasopharynx without discrete mass. Upper chest: Extensive streak artifact from bilateral shoulder arthroplasties. Scattered atelectatic changes noted within the visualized lungs. No other acute finding. Review of the MIP images confirms the above findings CTA HEAD FINDINGS Anterior circulation: Mild atheromatous change about the carotid siphons without hemodynamically significant stenosis. A1 segments patent bilaterally. Normal anterior communicating artery complex. Anterior cerebral arteries patent without stenosis. No M1 stenosis or occlusion. Distal MCA branches perfused and symmetric. Posterior circulation: Both V4 segments patent without stenosis. Right PICA grossly patent at its origin. Left PICA not seen. Basilar widely patent without stenosis. Superior cerebellar and posterior cerebral arteries patent bilaterally. Venous sinuses: Grossly patent allowing for timing the contrast bolus. Anatomic variants: As above.  No aneurysm. Review of the MIP images confirms the above findings IMPRESSION: CT HEAD: 1. Evolving subacute right PICA distribution infarct, better seen on prior brain MRI. No associated  hemorrhage or mass effect. 2. No other acute intracranial abnormality. 3. Age-related cerebral atrophy with moderate chronic microvascular ischemic disease. CTA HEAD AND NECK: 1. Irregularity involving the distal right vertebral artery at the V3 segment, with secondary long segment severe near occlusive stenosis at this level. 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 No visible raised dissection flap or intraluminal thrombus. 2. Mild atheromatous change about the carotid siphons and left carotid bulb without hemodynamically significant stenosis. 3. Diffuse tortuosity of the major arterial vasculature of the head and neck, suggesting chronic underlying hypertension. 4. Aortic Atherosclerosis (ICD10-I70.0). Electronically Signed   By: Rise Mu M.D.   On: 10/20/2023 05:32   MR BRAIN WO CONTRAST  Result Date: 10/19/2023 CLINICAL DATA:  Initial evaluation for neuro deficit, stroke suspected. EXAM: MRI HEAD WITHOUT CONTRAST TECHNIQUE: Multiplanar, multiecho pulse sequences of the brain and surrounding structures were obtained without intravenous contrast. COMPARISON:  Prior MRI from earlier the same day as well as CT from 10/18/2023. FINDINGS: Brain: Examination moderately to severely degraded by motion artifact. Generalized age-related cerebral atrophy. Patchy and confluent T2/FLAIR hyperintensity involving the periventricular deep white matter both cerebral hemispheres, consistent with chronic small vessel ischemic disease, moderately advanced in nature. Remote hemorrhagic lacunar infarct noted at the right basal ganglia with chronic hemosiderin staining. Mild diffusion signal abnormality involving the inferior right cerebellum,  consistent with an evolving subacute right PICA distribution infarct. Developing encephalomalacia of and laminar necrosis noted within this region. No visible associated hemorrhage on this motion degraded exam. No significant regional mass  effect. No other evidence for acute or subacute infarct. Diffusion signal abnormality at the right basal ganglia of felt to be consistent with underlying susceptibility artifact. No mass lesion or midline shift. Mild ventricular prominence related to global parenchymal volume loss without hydrocephalus. No extra-axial fluid collection. Pituitary gland suprasellar region within normal limits. Vascular: Major intracranial vascular flow voids are maintained. Skull and upper cervical spine: Osteoarthritic and spondylitic changes noted about the skull base and visualized upper cervical spine, better evaluated on prior MRI. Chronic C1 fracture better seen on prior CT. Bone marrow signal intensity grossly within normal limits. No scalp soft tissue abnormality. Sinuses/Orbits: Prior bilateral ocular lens replacement. Changes of chronic left maxillary sinusitis. Scattered mucosal thickening noted elsewhere about the paranasal sinuses. Small left mastoid effusion noted, of doubtful significance. Other: None. IMPRESSION: 1. Motion degraded exam. 2. Evolving subacute right PICA distribution infarct. No visible associated hemorrhage or significant regional mass effect. 3. Underlying age-related cerebral atrophy with moderately advanced chronic microvascular ischemic disease, with remote hemorrhagic lacunar infarct at the right basal ganglia. Electronically Signed   By: Rise Mu M.D.   On: 10/19/2023 20:22   MR Cervical Spine W and Wo Contrast  Result Date: 10/19/2023 CLINICAL DATA:  82 year old female with trauma, neck pain. Chronic C1 fracture. Abnormal dens on cervical spine CT. EXAM: MRI CERVICAL SPINE WITHOUT AND WITH CONTRAST TECHNIQUE: Multiplanar and multiecho pulse sequences of the cervical spine, to include the craniocervical junction and cervicothoracic junction, were obtained without and with intravenous contrast. CONTRAST:  3mL GADAVIST GADOBUTROL 1 MMOL/ML IV SOLN COMPARISON:  CT cervical spine  yesterday. CTA head and neck 04/05/2023. Cervical spine MRI 03/26/2014.  Cervical spine CT 03/21/2014. FINDINGS: Alignment: Chronically abnormal. Reversal of the normal cervical lordosis and up to 5 mm of anterolisthesis of C4 on C5 was present in 2015. Chronic anterolisthesis at C3-C4. Alignment at those levels not significantly changed since that time. Chronically progressed anterolisthesis of C7 on T1. Abnormal C1-C2 alignment, further detailed below. 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 Partial collapse of those levels since 2015. Similar partial collapse of C4 and C7 since that time. Development of C2-C3 ankylosis since 2015. And similar partial collapse of multiple visible upper thoracic vertebral bodies. Early erosion of the odontoid in 2015, progressed and now severe. Large pannus about the odontoid now. Chronic anterior and posterior unhealed C1 ring fracture is new since 2015, 1st identified in April this year, and contributes to abnormal C1-C2 alignment. Underlying normal background bone marrow signal. Marrow edema is visible at the C1 ring, and odontoid. But no other marrow edema identified in the cervical or upper thoracic levels. Cord: Multilevel abnormal cervical spinal cord mass effect. But no cord edema or myelomalacia is identified. No abnormal intradural enhancement. Mild posterior cervical dural thickening and enhancement appears to be degenerative in nature. Posterior Fossa, vertebral arteries, paraspinal tissues: No signal abnormality at the cervicomedullary junction. T2 heterogeneity in the pons compatible with chronic small vessel disease. Encephalomalacia in the right cerebellum PICA territory with intrinsic T1 hyperintensity but also some enhancement. Normal basilar cisterns above the cervicomedullary junction. Preserved major vascular flow voids in the neck. Tortuous vertebral arteries, the left appears to be dominant. There is mild T2  and STIR hyperintense signal abnormality of the interspinous ligament at  C7-T1. No prevertebral fluid. No other definite signal abnormality in the cervical spine ligamentous complex or in the cervical paraspinal soft tissues. Disc levels: C1-C2: Bulky and partially enhancing pannus surrounding the odontoid with un healed C1 fracture and mild displacement. Superimposed anterior and posterior ligamentous hypertrophy. Spinal stenosis at the cervicomedullary junction with up to mild mass effect there. C2-C3:  Degenerative ankylosis.  No spinal stenosis. C3-C4: Chronic anterolisthesis. Bulky ligamentous hypertrophy. Mild spinal stenosis with up to mild cord mass effect. C4-C5: Pronounced chronic anterolisthesis. Severe ligament flavum hypertrophy superimposed. Moderate spinal stenosis and spinal cord mass effect (series 8, image 15). No cord signal abnormality. C5-C6: Chronic or congenital ankylosis. Mild ligamentous hypertrophy. Mild spinal stenosis. Mild if any cord mass effect. C6-C7:  Chronic degeneration without spinal stenosis. C7-T1: Chronic anterolisthesis. Bulky ligament flavum hypertrophy but no spinal stenosis. Similar extensive visible thoracic spine degeneration. There is mild multifactorial upper thoracic spinal stenosis at T1-T2 (series 8, image 29) with bulky ligament flavum hypertrophy and mild if any spinal stenosis. No other upper thoracic spinal stenosis through the T7-T8 level. IMPRESSION: 1. 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 2. Marrow edema in the C1 ring and associated with progressive chronic erosion of the odontoid which appears related to degenerative versus inflammatory soft tissue pannus there. Widespread partial collapse of cervical and upper thoracic vertebral bodies since 2015, but appears chronic with no other marrow edema  identified. 3. Evidence of C7-T1 interspinous ligament injury with mild signal abnormality there. 4. Degenerative stenosis from the cervicomedullary junction through T1-T2. Intermittent mass effect on the cervicomedullary junction and spinal cord, up to moderate at the C4-C5 level. But no cord edema or myelomalacia identified. No evidence of cord contusion. 5. Subacute appearing Right PICA Cerebellar Infarct. Developing encephalomalacia, laminar necrosis. Electronically Signed   By: Odessa Fleming M.D.   On: 10/19/2023 05:04   CT ABDOMEN PELVIS W CONTRAST  Result Date: 10/19/2023 CLINICAL DATA:  Nausea, vomiting, loss of appetite, weakness. EXAM: CT ABDOMEN AND PELVIS WITH CONTRAST TECHNIQUE: Multidetector CT imaging of the abdomen and pelvis was performed using the standard protocol following bolus administration of intravenous contrast. RADIATION DOSE REDUCTION: This exam was performed according to the departmental dose-optimization program which includes automated exposure control, adjustment of the mA and/or kV according to patient size and/or use of iterative reconstruction technique. CONTRAST:  75mL OMNIPAQUE IOHEXOL 300 MG/ML  SOLN COMPARISON:  CT abdomen and pelvis 12/11/2022 FINDINGS: Lower chest: No acute abnormality. Hepatobiliary: No focal liver abnormality is seen. No gallstones, gallbladder wall thickening, or biliary dilatation. Pancreas: Unremarkable. Spleen: Unremarkable. Adrenals/Urinary Tract: Stable adrenal glands. Cortical renal scarring in both kidneys. No urinary calculi or hydronephrosis. Unremarkable bladder. Stomach/Bowel: Normal caliber large and small bowel. Colonic diverticulosis without diverticulitis. Moderate colonic stool burden. The appendix is not definitively visualized. Moderate-to-large hiatal hernia containing a portion of the stomach in the chest. Vascular/Lymphatic: Aortic atherosclerosis. No enlarged abdominal or pelvic lymph nodes. Reproductive: Hysterectomy. Other: No free  intraperitoneal fluid or air. Musculoskeletal: No acute fracture. Posterior fusion L3-S1. Grade 1-2 anterolisthesis of L4. Multilevel advanced spondylosis and disc space height loss with degenerative endplate changes. IMPRESSION: 1. No acute abnormality in the abdomen or pelvis. 2. Moderate colonic stool burden. 3. Moderate-to-large hiatal hernia containing a portion of the stomach in the chest. 4.  Colonic diverticulosis without diverticulitis. Aortic Atherosclerosis (ICD10-I70.0). Electronically Signed   By: Minerva Fester  M.D.   On: 10/19/2023 02:22   CT HEAD WO CONTRAST ( )  Result Date: 10/19/2023 CLINICAL DATA:  Head trauma, minor (Age >= 65y); Neck trauma (Age >= 65y) Rhonda Davenport reprots woke up this morning at 1am feeling nausea and vomtied several times. EXAM: CT HEAD WITHOUT CONTRAST CT CERVICAL SPINE WITHOUT CONTRAST TECHNIQUE: Multidetector CT imaging of the head and cervical spine was performed following the standard protocol without intravenous contrast. Multiplanar CT image reconstructions of the cervical spine were also generated. RADIATION DOSE REDUCTION: This exam was performed according to the departmental dose-optimization program which includes automated exposure control, adjustment of the mA and/or kV according to patient size and/or use of iterative reconstruction technique. COMPARISON:  CT angio head and neck 04/05/2023, MRI cervical spine 03/26/2014 FINDINGS: CT HEAD FINDINGS Brain: Patchy and confluent areas of decreased attenuation are noted throughout the deep and periventricular white matter of the cerebral hemispheres bilaterally, compatible with chronic microvascular ischemic disease. No evidence of large-territorial acute infarction. No parenchymal hemorrhage. No mass lesion. No extra-axial collection. No mass effect or midline shift. No hydrocephalus. Basilar cisterns are patent. Vascular: No hyperdense vessel. Skull: No acute fracture or focal lesion. Sinuses/Orbits: Persistent  complete opacification visualized left maxillary sinus with associated sinus wall hypertrophy and intraluminal calcifications likely chronic or fungal sinusitis. Paranasal sinuses and mastoid air cells are clear. The orbits are unremarkable. Other: None. 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 Interval worsening of grade 1 anterolisthesis of C3 on C4 with 4 mm (from 2 mm) anterolisthesis. Skull base and vertebrae: Severe multilevel degenerative changes of the spine. Severe bilateral C4-C5 osseous neural foraminal stenosis. No severe osseous central canal stenosis. Interval worsening of severe resorption of the dens. Interval new resorption of the right aspect of the C1 posterior arch (2:35). 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 No acute fracture. No aggressive appearing focal osseous lesion or focal pathologic process. Soft tissues and spinal canal: No prevertebral fluid or swelling. No visible canal hematoma. Upper chest: Unremarkable. Other: Atherosclerotic plaque of the aortic arch and its branches. IMPRESSION: 1.  No acute intracranial abnormality. 2. No acute displaced fracture or traumatic listhesis of the cervical spine. 3. Interval worsening of severe resorption of the dens. Interval new resorption of the right aspect of the C1 posterior arch. Concern for underlying infection. Recommend MRI cervical spine for further evaluation. 4. Interval worsening of a known grade 2 anterolisthesis of C4 on C5 with 6 mm (from 4 mm) anterolisthesis. Interval worsening of grade 1 anterolisthesis of C3 on C4 with 4 mm (from 2 mm) anterolisthesis. Multilevel severe degenerative changes of the spine. This can further be evaluated on MRI. 5. Severe bilateral C4-C5 osseous neural foraminal stenosis. 6.  Aortic Atherosclerosis (ICD10-I70.0). Electronically Signed   By: Tish Frederickson M.D.   On: 10/19/2023 01:31    CT Cervical Spine Wo Contrast  Result Date: 10/19/2023 CLINICAL DATA:  Head trauma, minor (Age >= 65y); Neck trauma (Age >= 65y) Rhonda Davenport reprots woke up this morning at 1am feeling nausea and vomtied several times. EXAM: CT HEAD WITHOUT CONTRAST CT CERVICAL SPINE WITHOUT CONTRAST TECHNIQUE: Multidetector CT imaging of the head and cervical spine was performed following the standard protocol without intravenous contrast. Multiplanar CT image reconstructions of the cervical spine were also generated. RADIATION DOSE REDUCTION: This exam was performed according to the departmental dose-optimization program which includes automated exposure control, adjustment  of the mA and/or kV according to patient size and/or use of iterative reconstruction technique. COMPARISON:  CT angio head and neck 04/05/2023, MRI cervical spine 03/26/2014 FINDINGS: CT HEAD FINDINGS Brain: Patchy and confluent areas of decreased attenuation are noted throughout the deep and periventricular white matter of the cerebral hemispheres bilaterally, compatible with chronic microvascular ischemic disease. No evidence of large-territorial acute infarction. No parenchymal hemorrhage. No mass lesion. No extra-axial collection. No mass effect or midline shift. No hydrocephalus. Basilar cisterns are patent. Vascular: No hyperdense vessel. Skull: No acute fracture or focal lesion. Sinuses/Orbits: Persistent complete opacification visualized left maxillary sinus with associated sinus wall hypertrophy and intraluminal calcifications likely chronic or fungal sinusitis. Paranasal sinuses and mastoid air cells are clear. The orbits are unremarkable. Other: None. 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 Interval worsening of grade 1 anterolisthesis of C3 on C4 with 4 mm (from 2 mm) anterolisthesis. Skull base and vertebrae: Severe multilevel degenerative changes of  the spine. Severe bilateral C4-C5 osseous neural foraminal stenosis. No severe osseous central canal stenosis. Interval worsening of severe resorption of the dens. Interval new resorption of the right aspect of the C1 posterior arch (2:35). 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 No acute fracture. No aggressive appearing focal osseous lesion or focal pathologic process. Soft tissues and spinal canal: No prevertebral fluid or swelling. No visible canal hematoma. Upper chest: Unremarkable. Other: Atherosclerotic plaque of the aortic arch and its branches. IMPRESSION: 1.  No acute intracranial abnormality. 2. No acute displaced fracture or traumatic listhesis of the cervical spine. 3. Interval worsening of severe resorption of the dens. Interval new resorption of the right aspect of the C1 posterior arch. Concern for underlying infection. Recommend MRI cervical spine for further evaluation. 4. Interval worsening of a known grade 2 anterolisthesis of C4 on C5 with 6 mm (from 4 mm) anterolisthesis. Interval worsening of grade 1 anterolisthesis of C3 on C4 with 4 mm (from 2 mm) anterolisthesis. Multilevel severe degenerative changes of the spine. This can further be evaluated on MRI. 5. Severe bilateral C4-C5 osseous neural foraminal stenosis. 6.  Aortic Atherosclerosis (ICD10-I70.0). Electronically Signed   By: Tish Frederickson M.D.   On: 10/19/2023 01:31    Disposition Plan & Communication  Patient status: Inpatient  Admitted From: ALF Planned disposition location: Assisted living Anticipated discharge date: 11/11 pending completed neuro workup  Family Communication: brother at bedside    Author: Leeroy Bock, DO [facility] 10/20/2023, 7:14 AM   Available by Epic secure chat 7AM-7PM. If 7PM-7AM, please contact night-coverage.  [facility] contact information found on [email or web address].

## 2023-10-20 NOTE — Evaluation (Signed)
Clinical/Bedside Swallow Evaluation Patient Details  Name: Rhonda Davenport MRN: [id number] Date of Birth: 1941-03-22  Today's Date: 10/20/2023 Time: SLP Start Time (ACUTE ONLY): 5621 SLP Stop Time (ACUTE ONLY): 0948 SLP Time Calculation (min) (ACUTE ONLY): 10 min  Past Medical History:  Past Medical History:  Diagnosis Date   Anemia    Chronic low back pain    Fall    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   GERD (gastroesophageal reflux disease)    H/O blood clots    pt states had blood clot in her rt arm   High cholesterol    Hypertension    Still's disease (HCC)    Thyroid disease    Hypothyroidism   Past Surgical History:  Past Surgical History:  Procedure Laterality Date   ABDOMINAL HYSTERECTOMY     APPENDECTOMY     CARPAL TUNNEL RELEASE Bilateral    JOINT REPLACEMENT     TKR, left   SHOULDER SURGERY     Bilateral   TONSILLECTOMY     HPI:  Rhonda Davenport is a 82 y.o. 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 who presents with difficulty walking that started two nights ago.  She states that she woke up in the middle the night (10/18/2023) and felt the need to vomit, and when she tried to stand up she was unable to walk.  Since that time, she has noticed some improvement both in her walking and her nausea.  She does complain of a bifrontal headache, stating that this is typical of her migraines. MRI (10/19/2023) revealed 1. Motion degraded exam.  2. Evolving subacute right PICA distribution infarct. No visible  associated hemorrhage or significant regional mass effect.  3. 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    Assessment / Plan / Recommendation  Clinical  Impression  Pt presents with intermittent overt s/s of oropharyngeal dysphagia when consuming oatmeal and thin liquids via straw. She also endorses some wet vocal quality at baseline prior to admission when consuming thin liquids "it makes me rattle in here (pointing to her chest). I have told doctor after doctor and nobody has done anything or believed me." Pt's oral phase is c/b decreased but functional oral manipulation of boluses with evidential oral clearing after several swallows. Suspect this is related to overall cognitive state, distractions and self-directed communication about her wants/needs during consumption. Pt's pharyngeal phase is c/b consistent wet vocal quality and intermittent immediate and delayed throat clearing when consuming thin liquids via straw. Suspect this might be related to the above listed distractions but also possibility of decreased swallow initiation and strength of swallow. When consuming nectar thick liquids via straw, pt is free of any overt s/s of aspiration. She additionally stated, "that is much better, I don't mind that at all." At this time, pt presents with increased risk of aspiration and recommend downgrade to dysphagia 2 diet with nectar thick liquids (may use straw), medicine whole in puree. ST to follow for further diet toleration and potential for advancing liquids. Education provided to pt and her brother. SLP Visit Diagnosis: Dysphagia, oropharyngeal phase (R13.12)    Aspiration Risk  Mild aspiration risk    Diet Recommendation Dysphagia 2 (Fine chop);Nectar-thick liquid    Liquid Administration via: Straw;Cup Medication  Administration: Whole meds with puree Supervision: Intermittent supervision to cue for compensatory strategies;Staff to assist with self feeding Compensations: Minimize environmental distractions;Slow rate;Small sips/bites Postural Changes: Seated upright at 90 degrees;Remain upright for at least 30 minutes after po intake    Other   Recommendations Oral Care Recommendations: Oral care BID Caregiver Recommendations: Avoid jello, ice cream, thin soups, popsicles;Remove water pitcher    Recommendations for follow up therapy are one component of a multi-disciplinary discharge planning process, led by the attending physician.  Recommendations may be updated based on patient status, additional functional criteria and insurance authorization.  Follow up Recommendations Follow physician's recommendations for discharge plan and follow up therapies      Assistance Recommended at Discharge  TBD  Functional Status Assessment Patient has had a recent decline in their functional status and/or demonstrates limited ability to make significant improvements in function in a reasonable and predictable amount of time  Frequency and Duration min 2x/week  2 weeks       Prognosis Prognosis for improved oropharyngeal function: Fair Barriers to Reach Goals: Cognitive deficits;Time post onset      Swallow Study   General Date of Onset: 10/19/23 HPI: Rhonda Davenport is a 82 y.o. 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 who presents with difficulty walking that started two nights ago.  She states that she woke up in the middle the night (10/18/2023) and felt the need to vomit, and when she tried to stand up she was unable to walk.  Since that time, she has noticed some improvement both in her walking and her nausea.  She does complain of a bifrontal headache, stating that this is typical of her migraines. MRI (10/19/2023) revealed 1. Motion degraded exam.  2. Evolving subacute right PICA distribution infarct. No visible  associated hemorrhage or significant regional mass effect.  3. Underlying age-related cerebral atrophy with moderately advanced  chronic  microvascular ischemic disease, with remote hemorrhagic  lacunar infarct at the right basal ganglia. Type of Study: Bedside Swallow Evaluation Previous Swallow Assessment: none in chart Diet Prior to this Study: Dysphagia 2 (finely chopped);Thin liquids (Level 0) (Pt had been NPO d/t failed [facility] Swallow Screen until earlier this morning) Temperature Spikes Noted: No Respiratory Status: Room air History of Recent Intubation: No Behavior/Cognition: Alert;Distractible;Requires cueing Oral Cavity Assessment: Within Functional Limits Oral Care Completed by SLP: No Oral Cavity - Dentition: Dentures, top Vision: Functional for self-feeding Self-Feeding Abilities: Needs set up;Needs assist Patient Positioning: Upright in bed Baseline Vocal Quality: Normal Volitional Cough: Weak Volitional Swallow: Able to elicit    Oral/Motor/Sensory Function Overall Oral Motor/Sensory Function: Within functional limits   Ice Chips Ice chips: Not tested   Thin Liquid Thin Liquid: Impaired Presentation: Self Fed;Straw Oral Phase Impairments: Poor awareness of bolus Pharyngeal  Phase Impairments: Suspected delayed Swallow;Decreased hyoid-laryngeal movement;Throat Clearing - Immediate;Wet Vocal Quality    Nectar Thick Nectar Thick Liquid: Within functional limits Presentation: Self Fed;Straw   Honey Thick Honey Thick Liquid: Not tested   Puree Puree: Within functional limits Presentation: Spoon;Self Fed   Solid     Solid: Impaired Presentation: Self Fed;Spoon Oral Phase Impairments: Poor awareness of bolus;Impaired mastication Oral Phase Functional Implications: Prolonged oral transit;Oral residue Pharyngeal Phase Impairments: Wet Vocal Quality     Grace Haggart B. Dreama Saa, M.S., CCC-SLP, CBIS Speech-Language Pathologist Certified Brain Injury Specialist [facility]  [facility] Rehabilitation Services Office [phone] Ascom [phone] Fax [phone]

## 2023-10-20 NOTE — Evaluation (Signed)
Speech Language Pathology Evaluation Patient Details Name: Rhonda Davenport MRN: [id number] DOB: 10-15-1941 Today's Date: 10/20/2023 Time: 2440-1027 SLP Time Calculation (min) (ACUTE ONLY): 10 min  Problem List:  Patient Active Problem List   Diagnosis Date Noted   CVA (cerebral vascular accident) (HCC) 10/19/2023   Degenerative scoliosis in adult patient 04/16/2023   C1 cervical fracture (HCC) 04/05/2023   Neck pain 04/05/2023   Chronic pain syndrome 02/27/2021   History of recurrent UTIs 11/16/2019   Polypharmacy 02/23/2019   Acute cystitis without hematuria 03/03/2018   Sciatica of right side 03/03/2018   Vitamin B12 deficiency 02/13/2018   Pleural effusion, left 06/19/2017   Abnormal EKG 06/16/2017   Chronic kidney disease (CKD) stage G3b/A3, moderately decreased glomerular filtration rate (GFR) between 30-44 mL/min/1.73 square meter and albuminuria creatinine ratio greater than 300 mg/g (HCC) 05/13/2017   Grief reaction 02/21/2017   Diverticulitis of large intestine without perforation or abscess without bleeding 07/18/2016   LLQ abdominal pain 05/21/2016   Eczema 05/07/2016   Diaphoresis 03/28/2016   Orthostatic hypotension 03/22/2016   Anxiety 03/15/2016   Chronic cerebral ischemia 03/15/2016   Cystocele 03/15/2016   Osteoarthritis 03/15/2016   Macular degeneration 03/15/2016   Senile osteoporosis 03/15/2016   Thrombocytosis 03/15/2016   Vitamin D deficiency 03/15/2016   Adjustment disorder with anxious mood 03/14/2016   GERD (gastroesophageal reflux disease) 03/14/2016   Nausea 03/14/2016   Primary insomnia 02/29/2016   Drug-induced confusion, acute 12/24/2015   Overuse of medication 12/24/2015   Adjustment and management of cardiac pacemaker 12/24/2015   Contusion    Acute renal failure (ARF) (HCC) 12/19/2015   Altered mental status 12/19/2015   Lethargy 12/15/2015   Ataxia 10/27/2015   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   Frequent falls  10/27/2015   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   Major depressive disorder, single episode 10/06/2015   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   TIA (transient ischemic attack) 09/06/2015   Iron deficiency anemia 06/22/2015   Chronic systolic heart failure (HCC) 06/21/2015   Localized edema 06/21/2015   Essential hypertension 05/30/2015   Malaise and fatigue 05/30/2015   Lumbar spondylolysis 02/07/2015   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   Leg weakness, bilateral 01/05/2015   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 08/02/2014   Lumbar degenerative disc disease 06/07/2014   Lumbar radiculopathy 06/07/2014   Elevated blood pressure 05/06/2014   Rheumatoid arthritis involving multiple joints (HCC) 05/06/2014   Seizure (HCC) 03/24/2014   Altered mental state 03/24/2014   Generalized anxiety disorder 02/15/2014   Hypothyroidism 05/16/2013   Hyperlipidemia 05/16/2013   Cough 12/25/2012   Past Medical History:  Past Medical History:  Diagnosis Date   Anemia    Chronic low back pain    Fall    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   GERD (gastroesophageal reflux disease)    H/O blood clots    pt states had blood clot in her rt arm   High cholesterol    Hypertension    Still's disease (HCC)    Thyroid disease    Hypothyroidism   Past Surgical History:  Past Surgical History:  Procedure Laterality Date   ABDOMINAL HYSTERECTOMY     APPENDECTOMY     CARPAL TUNNEL RELEASE Bilateral    JOINT REPLACEMENT     TKR, left   SHOULDER SURGERY     Bilateral   TONSILLECTOMY     HPI:  Rhonda Davenport is a 82 y.o. female  has a past medical history of Anemia, Chronic low  back pain, Fall, Gait disorder (01/05/2015), GERD (gastroesophageal reflux disease), H/O blood clots, High cholesterol, Hypertension, Still's disease (HCC), and Thyroid disease. who presents with difficulty walking that started two nights ago.  She states that she woke up in the middle the night (10/18/2023) and felt the  need to vomit, and when she tried to stand up she was unable to walk.  Since that time, she has noticed some improvement both in her walking and her nausea.  She does complain of a bifrontal headache, stating that this is typical of her migraines. MRI (10/19/2023) revealed 1. Motion degraded exam.  2. Evolving subacute right PICA distribution infarct. No visible  associated hemorrhage or significant regional mass effect.  3. 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   Assessment / Plan / Recommendation Clinical Impression  Pt's cognitive linguistic abilities were screened in accordance with stroke protocol. Pt states that she lives at [place] and wishes to discharge there again but then intermittently states she wishes to discharge home - to which she and her brother have disagreement with her abilities at home. This Clinical research associate administered the [employer] Mental Status (SLUMS) examination.   The "[employer] Mental Status" (SLUMS) Examination was administered. Pt scored **/30, raising concern for the presence of a neurocognitive disorder. Further testing would be beneficial, as deficits of attention, memory, oriantion, problem solving, and executive functions identified today may negatively impact pt safety with independent living.   SLUMS Examination Orientation  3/3  Numeric Problem Solving  0/3  Memory  2/5  Attention 2/2  Thought Organization 2/3  Clock Drawing 0/4  Visuospatial Skills               1/2  Short Story Recall  2/8  Total  12/30     Scoring  High School Education  Less than High School Education   Normal  27-30 25-30  Mild Neurocognitive Disorder 21-26 20-24  Dementia  1-20 1-19   Suspect pt's deficits are chronic in nature to which her brother agrees.   After completion of screen, pt inquired as to how she "did." Education  provided on score, likely chronic nature of deficits and continued recommendation for pt to have support at discharge.      SLP Assessment  SLP Recommendation/Assessment: Patient does not need any further Speech Lanaguage Pathology Services (for cognitive linguistic intervention) SLP Visit Diagnosis: Cognitive communication deficit (R41.841)    Recommendations for follow up therapy are one component of a multi-disciplinary discharge planning process, led by the attending physician.  Recommendations may be updated based on patient status, additional functional criteria and insurance authorization.    Follow Up Recommendations  Follow physician's recommendations for discharge plan and follow up therapies    Assistance Recommended at Discharge  Set up Supervision/Assistance  Functional Status Assessment Patient has had a recent decline in their functional status and/or demonstrates limited ability to make significant improvements in function in a reasonable and predictable amount of time  Frequency and Duration min 2x/week         SLP Evaluation Cognition  Overall Cognitive Status: History of cognitive impairments - at baseline Arousal/Alertness: Awake/alert Orientation Level: Oriented to person;Oriented to place;Oriented to situation;Disoriented to time       Comprehension  Auditory Comprehension Overall Auditory Comprehension: Appears within functional limits for tasks assessed Visual Recognition/Discrimination Discrimination: Not tested Reading Comprehension Reading Status: Not tested  Expression Expression Primary Mode of Expression: Verbal Verbal Expression Overall Verbal Expression: Appears within functional limits for tasks assessed Written Expression Dominant Hand: Right Written Expression: Within Functional Limits   Oral / Motor  Oral Motor/Sensory Function Overall Oral Motor/Sensory Function: Within functional limits Motor Speech Overall Motor Speech: Appears  within functional limits for tasks assessed       Evie Crumpler B. Dreama Saa, M.S., CCC-SLP, CBIS Speech-Language Pathologist Certified Brain Injury Specialist [facility]  [facility] [phone] Ascom [phone] Fax [phone]        Reuel Derby 10/20/2023, 12:59 PM

## 2023-10-20 NOTE — Progress Notes (Signed)
Patient transported to ultrasound at 1308 and returned in stable condition at 1319 in bed.

## 2023-10-20 NOTE — Plan of Care (Signed)
  Problem: Activity: Goal: Risk for activity intolerance will decrease Outcome: Progressing   Problem: Nutrition: Goal: Adequate nutrition will be maintained Outcome: Progressing   Problem: Coping: Goal: Level of anxiety will decrease Outcome: Progressing   Problem: Elimination: Goal: Will not experience complications related to bowel motility Outcome: Progressing   Problem: Pain Management: Goal: General experience of comfort will improve Outcome: Progressing   Problem: Education: Goal: Knowledge of disease or condition will improve Outcome: Progressing   Problem: Ischemic Stroke/TIA Tissue Perfusion: Goal: Complications of ischemic stroke/TIA will be minimized Outcome: Progressing   Problem: Coping: Goal: Will verbalize positive feelings about self Outcome: Progressing   Problem: Health Behavior/Discharge Planning: Goal: Ability to manage health-related needs will improve Outcome: Progressing   Problem: Self-Care: Goal: Ability to participate in self-care as condition permits will improve Outcome: Progressing   Problem: Nutrition: Goal: Risk of aspiration will decrease Outcome: Progressing

## 2023-10-20 NOTE — Progress Notes (Signed)
Physical Therapy Treatment Patient Details Name: Rhonda Davenport MRN: [id number] DOB: 07-28-1941 Today's Date: 10/20/2023   History of Present Illness Rhonda Davenport is an 82 y.o. female who presented to the ED with c/o of nausea, vomiting, and recent fall. MRI showed Evidence of C7-T1 interspinous ligament injury as well as Subacute appearing Right PICA Cerebellar Infarct. PMH includes: rheumatoid arthritis, iron deficient anemia, GERD, gait disorder, Still's disease, CHF, CKD, seizure.    PT Comments  Pt ready for session.  Transfers in and out of bed with supervision.  She is able and motivated to walk x 4 laps with pediatric size RW.  L side is weaker and she pushes walker unevenly and needs min a at times and verbal cues to push more evenly.  Does endorse some fatigue after 4 laps and opts to return to bed.  Pt remains appropriate for ALF but may need some increased supervision and light assist.     If plan is discharge home, recommend the following: A little help with walking and/or transfers;A little help with bathing/dressing/bathroom;Assistance with cooking/housework;Direct supervision/assist for medications management;Direct supervision/assist for financial management;Assist for transportation;Help with stairs or ramp for entrance   Can travel by private vehicle        Equipment Recommendations       Recommendations for Other Services       Precautions / Restrictions Precautions Precautions: Fall Restrictions Weight Bearing Restrictions: No     Mobility  Bed Mobility Overal bed mobility: Modified Independent                  Transfers Overall transfer level: Needs assistance Equipment used: Rolling walker (2 wheels) Transfers: Sit to/from Stand Sit to Stand: Supervision, Contact guard assist                Ambulation/Gait Ambulation/Gait assistance: Contact guard assist, Min assist Gait Distance (Feet): 640 Feet Assistive device: Rolling walker (2  wheels) Gait Pattern/deviations: Step-through pattern, Narrow base of support Gait velocity: decreased     General Gait Details: light assist to keep walker even as L side is weaker and doesn't push as hard   Stairs             Wheelchair Mobility     Tilt Bed    Modified Rankin (Stroke Patients Only)       Balance Overall balance assessment: Needs assistance Sitting-balance support: Feet supported, No upper extremity supported Sitting balance-Leahy Scale: Fair     Standing balance support: Bilateral upper extremity supported, During functional activity Standing balance-Leahy Scale: Fair                              Cognition Arousal: Alert Behavior During Therapy: WFL for tasks assessed/performed Overall Cognitive Status: History of cognitive impairments - at baseline                                          Exercises      General Comments        Pertinent Vitals/Pain Pain Assessment Pain Assessment: No/denies pain Pain Intervention(s): Monitored during session    Home Living                          Prior Function  PT Goals (current goals can now be found in the care plan section) Progress towards PT goals: Progressing toward goals    Frequency    7X/week      PT Plan      Co-evaluation              AM-PAC PT "6 Clicks" Mobility   Outcome Measure  Help needed turning from your back to your side while in a flat bed without using bedrails?: None Help needed moving from lying on your back to sitting on the side of a flat bed without using bedrails?: None Help needed moving to and from a bed to a chair (including a wheelchair)?: A Little Help needed standing up from a chair using your arms (e.g., wheelchair or bedside chair)?: None Help needed to walk in hospital room?: A Little Help needed climbing 3-5 steps with a railing? : A Lot 6 Click Score: 20    End of Session  Equipment Utilized During Treatment: Gait belt Activity Tolerance: Patient tolerated treatment well Patient left: in bed;with call bell/phone within reach;with bed alarm set;with family/visitor present Nurse Communication: Mobility status PT Visit Diagnosis: Unsteadiness on feet (R26.81);Other abnormalities of gait and mobility (R26.89)     Time: 1478-2956 PT Time Calculation (min) (ACUTE ONLY): 16 min  Charges:    $Gait Training: 8-22 mins PT General Charges $$ ACUTE PT VISIT: 1 Visit                   Danielle Dess, PTA 10/20/23, 12:53 PM

## 2023-10-21 DIAGNOSIS — R000 Tachycardia, unspecified: Secondary | ICD-10-CM

## 2023-10-21 DIAGNOSIS — I639 Cerebral infarction, unspecified: Secondary | ICD-10-CM | POA: Diagnosis not present

## 2023-10-21 MED ORDER — HYDROCORTISONE 1 % EX CREA
TOPICAL_CREAM | Freq: Three times a day (TID) | CUTANEOUS | Status: DC | PRN
  Filled 2023-10-21: qty 28

## 2023-10-21 NOTE — Progress Notes (Signed)
Per Dr Dareen Piano, dc tele monitoring

## 2023-10-21 NOTE — Progress Notes (Signed)
PROGRESS NOTE  Rhonda Davenport    DOB: [birth year], 82 y.o.  ZOX:[id number]    Code Status: Full Code   DOA: 10/18/2023   LOS: 2   Brief hospital course  Rhonda Davenport is a 82 y.o. female with medical history significant of CVA/TIA, HFrEF with CAD followed by [facility] cardiology, chronic C1 dens fracture, Rheumatoid arthritis followed by Dr. Deanne Coffer locally, CKD, hypothyroidism presenting with CVA with LE weakness. Has baseline lower extremity weakness that seem to have gotten significantly worsened especially since yesterday. Reports chronic back pain and weakness in the setting of chronic C1 fracture. Presented to the ER afebrile, hemodynamically stable.  Satting well on room air.  White count 8.7, hemoglobin 11.1, platelets 356, creatinine 1.1, troponin 20.  EKG normal sinus rhythm.  CT head within normal limits.  Noted interval worsening of resorption of the dens.  MRI C-spine showing chronic severe cervical spine degeneration around C5 and C6 with unhealed C1 ring fracture.  Positive degenerative stenosis from cervical medullary junction through T1-T2.  Also with subacute appearing right PICA cerebellar infarct with developing encephalomalacia and laminar necrosis. Brain MRI: Evolving subacute right PICA distribution infarct.   10/21/23 -stable with improvement in LE weakness. She is ready to go back to [facility] when they will accept her.   Assessment & Plan  Principal Problem:   CVA (cerebral vascular accident) (HCC) Active Problems:   Leg weakness, bilateral   Chronic kidney disease (CKD) stage G3b/A3, moderately decreased glomerular filtration rate (GFR) between 30-44 mL/min/1.73 square meter and albuminuria creatinine ratio greater than 300 mg/g (HCC)   Chronic systolic heart failure (HCC)   Essential hypertension   GERD (gastroesophageal reflux disease)   Rheumatoid arthritis involving multiple joints (HCC)   C1 cervical fracture (HCC)  Leg weakness, bilateral Acute CVA Brain  MRI: Evolving subacute right PICA distribution infarct.  Endorses improvement in her LE weakness. Echo unremarkable.  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 Marianna Fuss cardiology  Appears euvolemic to clinically dry Continue home regimen for now Monitor   Chronic kidney disease (CKD) stage G3b/A3, moderately decreased glomerular filtration rate (GFR) between 30-44 mL/min/1.73 square meter and albuminuria creatinine ratio greater than 300 mg/g (HCC) Creatinine 1.1 with GFR in the 50s Monitor  Body mass index is 19.68 kg/m.  VTE ppx: enoxaparin (LOVENOX) injection 30 mg Start: 10/19/23 2200  Diet:     Diet   DIET DYS 2 Fluid consistency: Nectar Thick   Consultants: Neurology   Subjective 10/21/23    Pt reports doing well. She has no complaints and really wants to leave today.    Objective   Vitals:   10/20/23 1525 10/20/23 1945 10/20/23 2345 10/21/23 0345  BP: (!) 108/95 139/76 (!) 145/92 (!) 170/80  Pulse: 84 88 88 (!) 108  Resp: 17 18 18 20   Temp: 98.3 F (36.8 C) 98.7 F (37.1 C) 98.3 F (36.8 C)  97.9 F (36.6 C)  TempSrc:  Oral Oral Oral  SpO2: 100% 100% 99% 100%  Weight:      Height:       No intake or output data in the 24 hours ending 10/21/23 0711 Filed Weights   10/19/23 0842  Weight: 38.4 kg     Physical Exam:  General: awake, alert, NAD HEENT: atraumatic, clear conjunctiva, anicteric sclera, MMM, hearing grossly normal Respiratory: normal respiratory effort. Cardiovascular: quick capillary refill, normal S1/S2, RRR, no JVD, murmurs Nervous: A&O x3. Moving all extremities. normal speech Extremities: no edema, normal tone Skin: dry, intact, normal temperature, normal color. No rashes, lesions or ulcers on exposed skin Psychiatry: normal mood, congruent affect  Labs   I have personally reviewed the following labs and imaging studies CBC    Component Value Date/Time   WBC 8.7 10/18/2023 1656   RBC 3.98 10/18/2023 1656   HGB 11.1 (L) 10/18/2023 1656   HCT 33.5 (L) 10/18/2023 1656   PLT 356 10/18/2023 1656   MCV 84.2 10/18/2023 1656   MCH 27.9 10/18/2023 1656   MCHC 33.1 10/18/2023 1656   RDW 11.9 10/18/2023 1656   LYMPHSABS 2.1 04/05/2023 1039   MONOABS 1.0 04/05/2023 1039   EOSABS 0.2 04/05/2023 1039   BASOSABS 0.0 04/05/2023 1039      Latest Ref Rng & Units 10/18/2023    4:56 PM 04/05/2023   10:39 AM 12/11/2022   10:42 AM  BMP  Glucose 70 - 99 mg/dL 960  98  89   BUN 8 - 23 mg/dL 28  14  17    Creatinine 0.44 - 1.00 mg/dL 4.54  0.98  1.19   Sodium 135 - 145 mmol/L 135  136  137   Potassium 3.5 - 5.1 mmol/L 3.9  4.1  5.1   Chloride 98 - 111 mmol/L 96  105  101   CO2 22 - 32 mmol/L 25  21  26    Calcium 8.9 - 10.3 mg/dL 14.7  8.9  82.9     ECHOCARDIOGRAM COMPLETE  Result Date: 10/20/2023    ECHOCARDIOGRAM REPORT   Patient Name:   Rhonda Davenport Date of Exam: 10/19/2023 Medical Rec #:  [id number]      Height:       55.0 in Accession #:    [id number]     Weight:       84.7 lb Date of Birth:  01-26-1941      BSA:          1.216 m Patient Age:    82 years       BP:           153/79 mmHg Patient Gender: F              HR:           95 bpm. Exam Location:  [facility] Procedure: 2D Echo Indications:     TIA G45.9  History:         Patient has no prior history of Echocardiogram  examinations.  Sonographer:     Overton Mam Referring Phys:  [id number] Rhonda Davenport Diagnosing Phys: Rhonda Davenport IMPRESSIONS  1. Left ventricular ejection fraction, by estimation, is 60 to 65%. The left ventricle has normal function. The left ventricle has no regional wall motion abnormalities. Left ventricular diastolic parameters are consistent with Grade I diastolic dysfunction (impaired relaxation).  2. Right ventricular systolic function is normal. The right ventricular size is normal.  3. The mitral valve  is normal in structure. No evidence of mitral valve regurgitation.  4. The aortic valve is tricuspid. Aortic valve regurgitation is not visualized. Aortic valve sclerosis is present, with no evidence of aortic valve stenosis.  5. The inferior vena cava is normal in size with greater than 50% respiratory variability, suggesting right atrial pressure of 3 mmHg. FINDINGS  Left Ventricle: Left ventricular ejection fraction, by estimation, is 60 to 65%. The left ventricle has normal function. The left ventricle has no regional wall motion abnormalities. The left ventricular internal cavity size was normal in size. There is  no left ventricular hypertrophy. Left ventricular diastolic parameters are consistent with Grade I diastolic dysfunction (impaired relaxation). Right Ventricle: The right ventricular size is normal. No increase in right ventricular wall thickness. Right ventricular systolic function is normal. Left Atrium: Left atrial size was normal in size. Right Atrium: Right atrial size was normal in size. Pericardium: There is no evidence of pericardial effusion. Mitral Valve: The mitral valve is normal in structure. No evidence of mitral valve regurgitation. Tricuspid Valve: The tricuspid valve is normal in structure. Tricuspid valve regurgitation is mild. Aortic Valve: The aortic valve is tricuspid. Aortic valve regurgitation is not visualized. Aortic valve sclerosis is present, with no  evidence of aortic valve stenosis. Aortic valve peak gradient measures 7.0 mmHg. Pulmonic Valve: The pulmonic valve was normal in structure. Pulmonic valve regurgitation is not visualized. Aorta: The aortic root and ascending aorta are structurally normal, with no evidence of dilitation. Venous: The inferior vena cava is normal in size with greater than 50% respiratory variability, suggesting right atrial pressure of 3 mmHg. IAS/Shunts: No atrial level shunt detected by color flow Doppler.  LEFT VENTRICLE PLAX 2D LVIDd:         3.30 cm   Diastology LVIDs:         2.10 cm   LV e' medial:    5.55 cm/s LV PW:         0.90 cm   LV E/e' medial:  14.0 LV IVS:        1.00 cm   LV e' lateral:   7.51 cm/s LVOT diam:     1.80 cm   LV E/e' lateral: 10.3 LV SV:         41 LV SV Index:   34 LVOT Area:     2.54 cm  RIGHT VENTRICLE RV Basal diam:  2.30 cm RV S prime:     17.20 cm/s TAPSE (M-mode): 2.1 cm LEFT ATRIUM             Index        RIGHT ATRIUM          Index LA diam:        2.10 cm 1.73 cm/m   RA Area:     9.31 cm LA Vol (A2C):   23.5 ml 19.32 ml/m  RA Volume:   20.00 ml 16.45 ml/m LA Vol (A4C):   31.8 ml 26.15 ml/m LA Biplane Vol: 30.5 ml 25.08 ml/m  AORTIC VALVE                 PULMONIC VALVE AV Area (Vmax): 1.75 cm     PV Vmax:        1.14 m/s AV Vmax:        132.00 cm/s  PV Peak grad:   5.2 mmHg AV Peak Grad:   7.0 mmHg     RVOT Peak grad: 3 mmHg LVOT Vmax:  91.00 cm/s LVOT Vmean:     53.300 cm/s LVOT VTI:       0.161 m  AORTA Ao Root diam: 2.60 cm Ao Asc diam:  2.50 cm MITRAL VALVE                TRICUSPID VALVE MV Area (PHT): 5.54 cm     TV Peak grad:   16.7 mmHg MV Decel Time: 137 msec     TV Vmax:        2.04 m/s MV E velocity: 77.60 cm/s MV A velocity: 103.00 cm/s  SHUNTS MV E/A ratio:  0.75         Systemic VTI:  0.16 m                             Systemic Diam: 1.80 cm Rhonda Davenport Electronically signed by Rhonda Davenport Signature Date/Time: 10/20/2023/1:25:24 PM    Final    CT  ANGIO HEAD NECK W WO CM  Result Date: 10/20/2023 CLINICAL DATA:  Follow-up examination for stroke. EXAM: CT ANGIOGRAPHY HEAD AND NECK WITH AND WITHOUT CONTRAST TECHNIQUE: Multidetector CT imaging of the head and neck was performed using the standard protocol during bolus administration of intravenous contrast. Multiplanar CT image reconstructions and MIPs were obtained to evaluate the vascular anatomy. Carotid stenosis measurements (when applicable) are obtained utilizing NASCET criteria, using the distal internal carotid diameter as the denominator. RADIATION DOSE REDUCTION: This exam was performed according to the departmental dose-optimization program which includes automated exposure control, adjustment of the mA and/or kV according to patient size and/or use of iterative reconstruction technique. CONTRAST:  75mL OMNIPAQUE IOHEXOL 350 MG/ML SOLN COMPARISON:  Prior brain MRI from earlier the same day. FINDINGS: CT HEAD FINDINGS Brain: Age-related cerebral atrophy with moderate chronic microvascular ischemic disease. Evolving subacute right PICA distribution infarct again noted, better seen on prior brain MRI. No associated hemorrhage or mass effect. No other acute large vessel territory infarct. No other acute intracranial hemorrhage. No mass lesion or midline shift. Mild ventricular prominence related to global parenchymal volume loss without hydrocephalus. No extra-axial fluid collection. Vascular: No abnormal hyperdense vessel. Calcified atherosclerosis present at the skull base. Skull: Scalp soft tissues within normal limits.  Calvarium intact. Sinuses/Orbits: Globes and orbital soft tissues within normal limits. Chronic left maxillary sinusitis noted. Trace left mastoid effusion, of doubtful significance. Other: Prominent osteoarthritic changes with degenerative pannus formation noted about the C1-2 articulations. Resultant mild to moderate stenosis at the craniocervical junction. Review of the MIP images  confirms the above findings CTA NECK FINDINGS Aortic arch: Visualized aorta within normal limits for caliber. Standard branch pattern present at the aortic arch. Mild aortic atherosclerosis. Right carotid system: Right common and internal carotid arteries are tortuous without dissection or stenosis. Left carotid system: Left common and internal carotid arteries are tortuous without dissection. Mild atheromatous change about the left carotid bulb without hemodynamically significant stenosis. Vertebral arteries: Both vertebral arteries arise from subclavian arteries. Left vertebral artery dominant. Both vertebral arteries are tortuous. The left vertebral artery is patent without stenosis or dissection. On the right, there is irregularity involving the distal right vertebral artery at the V3 segment (series 10, images 187, 185). Secondary long segment severe near occlusive stenosis at this level, best seen on coronal images (series 11, image 119, 113). 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 No visible raised dissection flap or intraluminal thrombus. Distally, the right  vertebral artery is relatively normal in appearance as it enters the cranial vault. Skeleton: No worrisome osseous lesions. Severe degenerative spondylosis seen throughout the cervical spine, described on recent cervical spine MRI. Chronic C1 fracture again noted. Other neck: No other acute finding. Mild asymmetric fullness at the left nasopharynx without discrete mass. Upper chest: Extensive streak artifact from bilateral shoulder arthroplasties. Scattered atelectatic changes noted within the visualized lungs. No other acute finding. Review of the MIP images confirms the above findings CTA HEAD FINDINGS Anterior circulation: Mild atheromatous change about the carotid siphons without hemodynamically significant stenosis. A1 segments patent bilaterally. Normal anterior communicating artery complex. Anterior cerebral  arteries patent without stenosis. No M1 stenosis or occlusion. Distal MCA branches perfused and symmetric. Posterior circulation: Both V4 segments patent without stenosis. Right PICA grossly patent at its origin. Left PICA not seen. Basilar widely patent without stenosis. Superior cerebellar and posterior cerebral arteries patent bilaterally. Venous sinuses: Grossly patent allowing for timing the contrast bolus. Anatomic variants: As above.  No aneurysm. Review of the MIP images confirms the above findings IMPRESSION: CT HEAD: 1. Evolving subacute right PICA distribution infarct, better seen on prior brain MRI. No associated hemorrhage or mass effect. 2. No other acute intracranial abnormality. 3. Age-related cerebral atrophy with moderate chronic microvascular ischemic disease. CTA HEAD AND NECK: 1. Irregularity involving the distal right vertebral artery at the V3 segment, with secondary long segment severe near occlusive stenosis at this level. 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 No visible raised dissection flap or intraluminal thrombus. 2. Mild atheromatous change about the carotid siphons and left carotid bulb without hemodynamically significant stenosis. 3. Diffuse tortuosity of the major arterial vasculature of the head and neck, suggesting chronic underlying hypertension. 4. Aortic Atherosclerosis (ICD10-I70.0). Electronically Signed   By: Rise Mu M.D.   On: 10/20/2023 05:32   MR BRAIN WO CONTRAST  Result Date: 10/19/2023 CLINICAL DATA:  Initial evaluation for neuro deficit, stroke suspected. EXAM: MRI HEAD WITHOUT CONTRAST TECHNIQUE: Multiplanar, multiecho pulse sequences of the brain and surrounding structures were obtained without intravenous contrast. COMPARISON:  Prior MRI from earlier the same day as well as CT from 10/18/2023. FINDINGS: Brain: Examination moderately to severely degraded by motion artifact. Generalized age-related cerebral  atrophy. Patchy and confluent T2/FLAIR hyperintensity involving the periventricular deep white matter both cerebral hemispheres, consistent with chronic small vessel ischemic disease, moderately advanced in nature. Remote hemorrhagic lacunar infarct noted at the right basal ganglia with chronic hemosiderin staining. Mild diffusion signal abnormality involving the inferior right cerebellum, consistent with an evolving subacute right PICA distribution infarct. Developing encephalomalacia of and laminar necrosis noted within this region. No visible associated hemorrhage on this motion degraded exam. No significant regional mass effect. No other evidence for acute or subacute infarct. Diffusion signal abnormality at the right basal ganglia of felt to be consistent with underlying susceptibility artifact. No mass lesion or midline shift. Mild ventricular prominence related to global parenchymal volume loss without hydrocephalus. No extra-axial fluid collection. Pituitary gland suprasellar region within normal limits. Vascular: Major intracranial vascular flow voids are maintained. Skull and upper cervical spine: Osteoarthritic and spondylitic changes noted about the skull base and visualized upper cervical spine, better evaluated on prior MRI. Chronic C1 fracture better seen on prior CT. Bone marrow signal intensity grossly within normal limits. No scalp soft tissue abnormality. Sinuses/Orbits: Prior bilateral ocular lens replacement. Changes of chronic left maxillary sinusitis. Scattered mucosal thickening noted elsewhere about the paranasal sinuses. Small left  mastoid effusion noted, of doubtful significance. Other: None. IMPRESSION: 1. Motion degraded exam. 2. Evolving subacute right PICA distribution infarct. No visible associated hemorrhage or significant regional mass effect. 3. Underlying age-related cerebral atrophy with moderately advanced chronic microvascular ischemic disease, with remote hemorrhagic lacunar  infarct at the right basal ganglia. Electronically Signed   By: Rise Mu M.D.   On: 10/19/2023 20:22    Disposition Plan & Communication  Patient status: Inpatient  Admitted From: ALF Planned disposition location: Assisted living Anticipated discharge date: 11/12 pending facility acceptance back  Family Communication: brother at bedside    Author: Leeroy Bock, DO [facility] 10/21/2023, 7:11 AM   Available by Epic secure chat 7AM-7PM. If 7PM-7AM, please contact night-coverage.  [facility] contact information found on [email or web address].

## 2023-10-21 NOTE — Progress Notes (Signed)
Physical Therapy Treatment Patient Details Name: Rhonda Davenport MRN: [id number] DOB: 30-Nov-1941 Today's Date: 10/21/2023   History of Present Illness Rhonda Davenport is an 82 y.o. female who presented to the ED with c/o of nausea, vomiting, and recent fall. MRI showed Evidence of C7-T1 interspinous ligament injury as well as Subacute appearing Right PICA Cerebellar Infarct. PMH includes: rheumatoid arthritis, iron deficient anemia, GERD, gait disorder, Still's disease, CHF, CKD, seizure.    PT Comments  Pt alert and oriented and only complains of headache that pt reports has been a problem for years. Pt performed STS with supervision and amb 480' with RW and CGA to steer RW evenly. HR monitored t/o: 95 bpm pre-mobility, 130s with ambulation, and 91 bpm post-mobility. Pt would continue to benefit from skilled therapy to address L-sided weakness and balance impairments and progress toward mobility goals.   If plan is discharge home, recommend the following: A little help with walking and/or transfers;A little help with bathing/dressing/bathroom;Assistance with cooking/housework;Direct supervision/assist for medications management;Direct supervision/assist for financial management;Assist for transportation;Help with stairs or ramp for entrance   Can travel by private vehicle      yes  Equipment Recommendations  Other (comment) (youth size RW)    Recommendations for Other Services       Precautions / Restrictions Precautions Precautions: Fall Restrictions Weight Bearing Restrictions: No     Mobility  Bed Mobility               General bed mobility comments: NT- seated in recliner upon entry    Transfers Overall transfer level: Needs assistance Equipment used: Rolling walker (2 wheels) Transfers: Sit to/from Stand Sit to Stand: Supervision                Ambulation/Gait Ambulation/Gait assistance: Contact guard assist Gait Distance (Feet): 480 Feet Assistive device:  Rolling walker (2 wheels) Gait Pattern/deviations: Step-through pattern, Narrow base of support Gait velocity: decreased     General Gait Details: verbal cues to keep walker even as L side is weaker and doesn't push as hard; HR monitored t/o (130s with amb)   Stairs             Wheelchair Mobility     Tilt Bed    Modified Rankin (Stroke Patients Only)       Balance Overall balance assessment: Needs assistance Sitting-balance support: No upper extremity supported, Feet supported Sitting balance-Leahy Scale: Good     Standing balance support: Bilateral upper extremity supported, During functional activity Standing balance-Leahy Scale: Fair                              Cognition Arousal: Alert Behavior During Therapy: WFL for tasks assessed/performed Overall Cognitive Status: Within Functional Limits for tasks assessed                                          Exercises      General Comments        Pertinent Vitals/Pain Pain Assessment Pain Assessment: 0-10 Pain Score: 4  Pain Location: chronic headache Pain Descriptors / Indicators: Headache Pain Intervention(s): Limited activity within patient's tolerance, Repositioned, Monitored during session    Home Living                          Prior Function  PT Goals (current goals can now be found in the care plan section) Acute Rehab PT Goals Patient Stated Goal: to return to ALF PT Goal Formulation: With patient Time For Goal Achievement: 11/02/23 Potential to Achieve Goals: Good Progress towards PT goals: Progressing toward goals    Frequency    7X/week      PT Plan      Co-evaluation              AM-PAC PT "6 Clicks" Mobility   Outcome Measure  Help needed turning from your back to your side while in a flat bed without using bedrails?: None Help needed moving from lying on your back to sitting on the side of a flat bed without  using bedrails?: None Help needed moving to and from a bed to a chair (including a wheelchair)?: A Little Help needed standing up from a chair using your arms (e.g., wheelchair or bedside chair)?: None Help needed to walk in hospital room?: A Little Help needed climbing 3-5 steps with a railing? : A Lot 6 Click Score: 20    End of Session Equipment Utilized During Treatment: Gait belt Activity Tolerance: Patient tolerated treatment well Patient left: with chair alarm set;in chair;with call bell/phone within reach Nurse Communication: Mobility status PT Visit Diagnosis: Unsteadiness on feet (R26.81);Other abnormalities of gait and mobility (R26.89)     Time: 8469-6295 PT Time Calculation (min) (ACUTE ONLY): 20 min  Charges:    $Gait Training: 8-22 mins PT General Charges $$ ACUTE PT VISIT: 1 Visit                        Shauna Hugh, SPT 10/21/2023, 11:56 AM

## 2023-10-21 NOTE — Progress Notes (Signed)
NT went into pt's room to put tele leads back on her. Upon entering the room the pt was found to have an open bottle of pills in her bed. The NT came to get me and I went in to find the open bottle and pills scattered on the pt's bed. I asked the pt what the pills were and she stated the were the "dogs" pills. The bottle lad a barely legible label on it that said Buspar. Upon further investigation, another bottle was found in the pt's purse with a non-legible label with multiple pills in it. Pt stated she had not taken any of the pills. MD was notified of the findings and instructed to keep a close eye on the pt and obtain an EKG at 6am.

## 2023-10-21 NOTE — TOC Progression Note (Signed)
Transition of Care [facility]) - Progression Note    Patient Details  Name: Rhonda Davenport MRN: [id number] Date of Birth: [birth year]  [facility] CM/SW Contact  Allena Katz, LCSW Phone Number: 10/21/2023, 11:49 AM  Clinical Narrative:   CSW spoke with Misty Stanley at  [facility] to see when patient could return. Misty Stanley reports she will have to come assess the patient and she will be doing that tomorrow.          Expected Discharge Plan and Services                                               Social Determinants of Health (SDOH) Interventions SDOH Screenings   Food Insecurity: No Food Insecurity (10/19/2023)  Housing: Low Risk  (10/19/2023)  Transportation Needs: No Transportation Needs (10/19/2023)  Utilities: Not At Risk (10/19/2023)  Tobacco Use: Medium Risk (10/18/2023)    Readmission Risk Interventions     No data to display

## 2023-10-21 NOTE — Progress Notes (Signed)
Speech Language Pathology Treatment: Dysphagia  Patient Details Name: Rhonda Davenport MRN: [id number] DOB: May 23, 1941 Today's Date: 10/21/2023 Time: 4401-0272 SLP Time Calculation (min) (ACUTE ONLY): 40 min  Assessment / Plan / Recommendation Clinical Impression  Pt seen for ongoing assessment of swallowing and trials to upgrade diet if appropriate. She is alert, verbally responsive and engaged in conversation w/ SLP stating she "hated the thickened drinks" and "hated having my food mushed up - I just didn't eat today".  Pt is on RA; afebrile; wbc wnl. Dentures in place.  Pt explained general aspiration precautions and agreed verbally to the need for following them especially sitting upright for all oral intake -- supported her more behind the back for full upright sitting. Discussed taking SMALL single sips "like drinking hot coffee". Pt fed herself trials of soft solids and thin liquids via Cup(NO straw). No overt coughing noted; no decline in respiratory presentation. Pt exhibited a mild, immediate throat clear when talking post a swallow/trial 1x; respiratory status remained calm and unlabored, vocal quality clear b/t trials otherwise. Oral phase appeared grossly [facility] for bolus management, mastication of softened solids, and timely A-P transfer for swallowing -- encouraged SMALL bites for better oral control and mastication ease vs larger bites. Oral clearing achieved w/ all consistencies - encouraged lingual sweeping to maintain clearing b/t bites.    Pt appears at reduced risk for aspiration when following aspiration precautions as given Education/hands-on practice with today. Pt gave verbal understanding of the increased risk for coughing/"strangling" if drinking too much, too fast.  Recommend a more mech soft diet for ease of soft foods w/ gravies added to moisten foods; Thin liquids via Cup. Recommend aspiration precautions to include No talking when eating/drinking and No straws when drinking  liquids. Recommend Pills Whole in Puree; tray setup and positioning assistance for meals.  ST services will monitor pt's status and toleration of diet while admitted; provide Education for follow through w/ precautions. NSG/MD updated. Precautions posted at bedside.       HPI HPI: KAEDENCE Davenport is a 82 y.o. 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 who presents with difficulty walking that started two nights ago.  She states that she woke up in the middle the night (10/18/2023) and felt the need to vomit, and when she tried to stand up she was unable to walk.  Since that time, she has noticed some improvement both in her walking and her nausea.  She does complain of a bifrontal headache, stating that this is typical of her migraines. MRI (10/19/2023) revealed 1. Motion degraded exam.  2. Evolving subacute right PICA distribution infarct. No visible  associated hemorrhage or significant regional mass effect.  3. 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      SLP Plan  Continue with current plan of care      Recommendations for follow up therapy are one component of a multi-disciplinary discharge planning process, led by the attending physician.  Recommendations may be updated based on patient status, additional functional criteria and insurance authorization.    Recommendations  Diet recommendations: Dysphagia 3 (mechanical soft);Thin liquid (cup only; minced meats w/ gravies) Liquids provided via: Cup;No straw Medication Administration: Whole meds with puree Supervision: Patient able to self feed;Intermittent  supervision to cue for compensatory strategies  (setup) Compensations: Minimize environmental distractions;Slow rate;Small sips/bites;Multiple dry swallows after each bite/sip;Lingual sweep for clearance of pocketing Postural Changes and/or Swallow Maneuvers: Out of bed for meals;Seated upright 90 degrees;Upright 30-60 min after meal                 (Dietician f/u; Palliative Care f/u) Oral care BID;Patient independent with oral care (denture care support)   Intermittent Supervision/Assistance Dysphagia, pharyngeal phase (R13.13) (chronic?)     Continue with current plan of care      Jerilynn Som, MS, CCC-SLP Speech Language Pathologist Rehab Services; [facility] [phone] (ascom) Ivorie Uplinger  10/21/2023, 4:05 PM

## 2023-10-22 DIAGNOSIS — I639 Cerebral infarction, unspecified: Secondary | ICD-10-CM | POA: Diagnosis not present

## 2023-10-22 MED ORDER — MORPHINE SULFATE (PF) 2 MG/ML IV SOLN
1.0000 mg | INTRAVENOUS | Status: DC | PRN
  Filled 2023-10-22: qty 1

## 2023-10-22 NOTE — Progress Notes (Signed)
Physical Therapy Treatment Patient Details Name: Rhonda Davenport MRN: [id number] DOB: [birth year] Today's Date: 10/22/2023   History of Present Illness Rhonda Davenport is an 82 y.o. female who presented to the ED with c/o of nausea, vomiting, and recent fall. MRI showed Evidence of C7-T1 interspinous ligament injury as well as Subacute appearing Right PICA Cerebellar Infarct. PMH includes: rheumatoid arthritis, iron deficient anemia, GERD, gait disorder, Still's disease, CHF, CKD, seizure.    PT Comments  Pt alert, agreeable to PT, eager to walk. She required supervision for transfers with RW to cue safe hand placement, no LOB noted. She ambulated ~572ft with no LOB, noted for walker to veer L, able to correct but did frustrate pt. No difficulty navigating obstacles (people in hallway, doors, door frames, etc). Also able to toilet and stand from standard toilet supervision (one reminder to use safety bar) as well as don/doff underwear (pt requested assistance, not necessarily required). Returned to recliner with needs in reach. The patient would benefit from further skilled PT intervention to continue to progress towards goals.    If plan is discharge home, recommend the following: A little help with walking and/or transfers;A little help with bathing/dressing/bathroom;Assistance with cooking/housework;Direct supervision/assist for medications management;Direct supervision/assist for financial management;Assist for transportation;Help with stairs or ramp for entrance   Can travel by private vehicle        Equipment Recommendations   (youth sized RW)    Recommendations for Other Services       Precautions / Restrictions Precautions Precautions: Fall Restrictions Weight Bearing Restrictions: No     Mobility  Bed Mobility               General bed mobility comments: NT- seated in recliner upon entry    Transfers Overall transfer level: Needs assistance Equipment used: Rolling  walker (2 wheels) Transfers: Sit to/from Stand Sit to Stand: Supervision           General transfer comment: cued for hand placement. able to stand without RW to doff gown. use of grab bar in bathroom to stand from toilet as well, supervision    Ambulation/Gait Ambulation/Gait assistance: Contact guard assist, Supervision Gait Distance (Feet): 500 Feet Assistive device: Rolling walker (2 wheels) Gait Pattern/deviations: Step-through pattern, Narrow base of support       General Gait Details: still had difficulty with RW veering to L but able to correct with extime (did frustrate pt)   Stairs             Wheelchair Mobility     Tilt Bed    Modified Rankin (Stroke Patients Only)       Balance Overall balance assessment: Needs assistance Sitting-balance support: Feet supported Sitting balance-Leahy Scale: Good     Standing balance support: Bilateral upper extremity supported, During functional activity Standing balance-Leahy Scale: Good Standing balance comment: able to doff/don underwear, but did ask for assistance for the back (pt reported this is a chronic difficulty).                            Cognition Arousal: Alert Behavior During Therapy: WFL for tasks assessed/performed Overall Cognitive Status: Within Functional Limits for tasks assessed                                          Exercises  General Comments        Pertinent Vitals/Pain Pain Assessment Pain Assessment: Faces Faces Pain Scale: Hurts a little bit Pain Location: chronic headache Pain Descriptors / Indicators: Headache Pain Intervention(s): Monitored during session    Home Living                          Prior Function            PT Goals (current goals can now be found in the care plan section) Progress towards PT goals: Progressing toward goals    Frequency    7X/week      PT Plan      Co-evaluation               AM-PAC PT "6 Clicks" Mobility   Outcome Measure  Help needed turning from your back to your side while in a flat bed without using bedrails?: None Help needed moving from lying on your back to sitting on the side of a flat bed without using bedrails?: None Help needed moving to and from a bed to a chair (including a wheelchair)?: None Help needed standing up from a chair using your arms (e.g., wheelchair or bedside chair)?: None Help needed to walk in hospital room?: None Help needed climbing 3-5 steps with a railing? : A Little 6 Click Score: 23    End of Session Equipment Utilized During Treatment: Gait belt Activity Tolerance: Patient tolerated treatment well Patient left: with chair alarm set;with call bell/phone within reach;in chair;with family/visitor present Nurse Communication: Mobility status PT Visit Diagnosis: Unsteadiness on feet (R26.81);Other abnormalities of gait and mobility (R26.89)     Time: 1030-1047 PT Time Calculation (min) (ACUTE ONLY): 17 min  Charges:    $Therapeutic Activity: 8-22 mins PT General Charges $$ ACUTE PT VISIT: 1 Visit                     Olga Coaster PT, DPT 11:27 AM,10/22/23

## 2023-10-22 NOTE — Plan of Care (Signed)

## 2023-10-22 NOTE — NC FL2 (Signed)
[place] MEDICAID FL2 LEVEL OF CARE FORM     IDENTIFICATION  Patient Name: Rhonda Davenport Birthdate: March 23, 1941 Sex: female Admission Date (Current Location): 10/18/2023  [place] and [place] Number:  Chiropodist and Address:  [facility], [address] [place], [place] [address]      Provider Number: [id number]  Attending Physician Name and Address:  Leeroy Bock, MD  Relative Name and Phone Number:       Current Level of Care: Hospital Recommended Level of Care: Assisted Living Facility Prior Approval Number:    Date Approved/Denied:   PASRR Number: [id number]  Discharge Plan: Domiciliary (Rest home)    Current Diagnoses: Patient Active Problem List   Diagnosis Date Noted   Acute CVA (cerebrovascular accident) (HCC) 10/21/2023   CVA (cerebral vascular accident) (HCC) 10/19/2023   Degenerative scoliosis in adult patient 04/16/2023   C1 cervical fracture (HCC) 04/05/2023   Neck pain 04/05/2023   Chronic pain syndrome 02/27/2021   History of recurrent UTIs 11/16/2019   Polypharmacy 02/23/2019   Acute cystitis without hematuria 03/03/2018   Sciatica of right side 03/03/2018   Vitamin B12 deficiency 02/13/2018   Pleural effusion, left 06/19/2017   Abnormal EKG 06/16/2017   Chronic kidney disease (CKD) stage G3b/A3, moderately decreased glomerular filtration rate (GFR) between 30-44 mL/min/1.73 square meter and albuminuria creatinine ratio greater than 300 mg/g (HCC) 05/13/2017   Grief reaction 02/21/2017   Diverticulitis of large intestine without perforation or abscess without bleeding 07/18/2016   LLQ abdominal pain 05/21/2016   Eczema 05/07/2016   Diaphoresis 03/28/2016   Orthostatic hypotension 03/22/2016   Anxiety 03/15/2016   Chronic cerebral ischemia 03/15/2016   Cystocele 03/15/2016   Osteoarthritis 03/15/2016   Macular degeneration 03/15/2016   Senile osteoporosis 03/15/2016   Thrombocytosis 03/15/2016    Vitamin D deficiency 03/15/2016   Adjustment disorder with anxious mood 03/14/2016   GERD (gastroesophageal reflux disease) 03/14/2016   Nausea 03/14/2016   Primary insomnia 02/29/2016   Drug-induced confusion, acute 12/24/2015   Overuse of medication 12/24/2015   Adjustment and management of cardiac pacemaker 12/24/2015   Contusion    Acute renal failure (ARF) (HCC) 12/19/2015   Altered mental status 12/19/2015   Lethargy 12/15/2015   Ataxia 10/27/2015   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   Frequent falls 10/27/2015   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   Major depressive disorder, single episode 10/06/2015   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   TIA (transient ischemic attack) 09/06/2015   Iron deficiency anemia 06/22/2015   Chronic systolic heart failure (HCC) 06/21/2015   Localized edema 06/21/2015   Essential hypertension 05/30/2015   Malaise and fatigue 05/30/2015   Lumbar spondylolysis 02/07/2015   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   Leg weakness, bilateral 01/05/2015   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 08/02/2014   Lumbar degenerative disc disease 06/07/2014   Lumbar radiculopathy 06/07/2014   Elevated blood pressure 05/06/2014   Rheumatoid arthritis involving multiple joints (HCC) 05/06/2014   Seizure (HCC) 03/24/2014   Altered mental state 03/24/2014   Generalized anxiety disorder 02/15/2014   Hypothyroidism 05/16/2013   Hyperlipidemia 05/16/2013   Cough 12/25/2012    Orientation RESPIRATION BLADDER Height & Weight     Self, Time, Situation, Place  Normal Incontinent Weight: 84 lb 10.5 oz (38.4 kg) Height:  4\' 7"  (139.7 cm)  BEHAVIORAL SYMPTOMS/MOOD NEUROLOGICAL BOWEL NUTRITION STATUS      Incontinent    AMBULATORY STATUS COMMUNICATION OF NEEDS Skin   Supervision Verbally Normal  Personal Care Assistance Level of Assistance  Bathing, Feeding, Dressing Bathing Assistance: Limited  assistance Feeding assistance: Independent Dressing Assistance: Limited assistance     Functional Limitations Info  Sight, Hearing, Speech Sight Info: Impaired Hearing Info: Adequate Speech Info: Adequate    SPECIAL CARE FACTORS FREQUENCY  OT (By licensed OT), PT (By licensed PT)     PT Frequency: 3 times a week OT Frequency: 3 times a week            Contractures Contractures Info: Not present    Additional Factors Info  Code Status, Allergies Code Status Info: FULL Allergies Info: Ibuprofen  Oxycodone  Tramadol  Atorvastatin  Ceftriaxone  Benztropine  Codeine  Diphenhydramine Hcl  Hydrocodone-acetaminophen  Leflunomide  Meloxicam           Current Medications (10/22/2023):  This is the current hospital active medication list Current Facility-Administered Medications  Medication Dose Route Frequency Provider Last Rate Last Admin   acetaminophen (TYLENOL) tablet 650 mg  650 mg Oral Q4H PRN Floydene Flock, MD   650 mg at 10/22/23 0911   Or   acetaminophen (TYLENOL) 160 MG/5ML solution 650 mg  650 mg Per Tube Q4H PRN Floydene Flock, MD       Or   acetaminophen (TYLENOL) suppository 650 mg  650 mg Rectal Q4H PRN Floydene Flock, MD       alum & mag hydroxide-simeth (MAALOX/MYLANTA) 200-200-20 MG/5ML suspension 30 mL  30 mL Oral Q4H PRN Leeroy Bock, MD   30 mL at 10/21/23 1949   aspirin EC tablet 81 mg  81 mg Oral Daily Rejeana Brock, MD   81 mg at 10/22/23 0910   clopidogrel (PLAVIX) tablet 75 mg  75 mg Oral Daily Floydene Flock, MD   75 mg at 10/22/23 0910   enoxaparin (LOVENOX) injection 30 mg  30 mg Subcutaneous Q24H Floydene Flock, MD   30 mg at 10/21/23 2115   hydrocortisone cream 1 %   Topical TID PRN Leeroy Bock, MD       hydrOXYzine (ATARAX) tablet 25 mg  25 mg Oral TID PRN Floydene Flock, MD   25 mg at 10/22/23 0911   labetalol (NORMODYNE) injection 10 mg  10 mg Intravenous Q2H PRN Floydene Flock, MD       lactulose  (CHRONULAC) 10 GM/15ML solution 10 g  10 g Oral BID Leeroy Bock, MD   10 g at 10/22/23 0910   LORazepam (ATIVAN) injection 0.5 mg  0.5 mg Intravenous Q4H PRN Floydene Flock, MD   0.5 mg at 10/20/23 2044   morphine (PF) 2 MG/ML injection 2 mg  2 mg Intravenous Q4H PRN Floydene Flock, MD   2 mg at 10/20/23 2351   pantoprazole (PROTONIX) EC tablet 40 mg  40 mg Oral BID PRN Floydene Flock, MD   40 mg at 10/22/23 9147     Discharge Medications: Please see discharge summary for a list of discharge medications.  Relevant Imaging Results:  Relevant Lab Results:   Additional Information SSN [id number] LCSW

## 2023-10-22 NOTE — Progress Notes (Addendum)
Speech Language Pathology Treatment: Dysphagia  Patient Details Name: Rhonda Davenport MRN: [id number] DOB: August 14, 1941 Today's Date: 10/22/2023 Time: 0830-0910 SLP Time Calculation (min) (ACUTE ONLY): 40 min  Assessment / Plan / Recommendation Clinical Impression  Pt seen for ongoing assessment of swallowing and trials to upgrade diet if appropriate. She is alert, verbally responsive and engaged in conversation w/ SLP stating she "enjoyed the real food last night, but I had REFLUX and threw up" -- pt had spaghetti and tomato soup. Noted pt ate some of her omelette from breakfast but not much. Family arrived post session.   Pt is on RA; afebrile; wbc wnl. Dentures in place.   Pt explained general aspiration precautions and was able to verbalize the need to sit upright for all oral intake -- supported her more behind the back for full upright sitting as she tended to slide down in the bed(small frame). Put a pillow low behind her back. Discussed precautions including: SMALL single sips "like drinking hot coffee"; SINGLE sips; SMALL bites of foods. Pt fed herself another few bites of soft solids and thin liquids via Cup(NO straw). No overt coughing noted; no decline in respiratory presentation; vocal quality remained clear b/t trials. Pt often talked b/t trials -- instructed her to talk less immediately post swallowing. Oral phase appeared grossly [facility] for bolus management, mastication of softened solids, and timely A-P transfer for swallowing -- encouraged SMALL bites for better oral control and mastication ease vs larger bites. Oral clearing achieved w/ all consistencies - encouraged lingual sweeping to maintain clearing b/t bites.  Pt requested to try a fruit plate for lunch and salad. Explained the difference b/t cooked, cohesive foods vs particulate foods. Pt agreed to focus on small bites, chewing these foods well, and oral clearing b/t bites.    Pt appears at reduced risk for aspiration when  following aspiration precautions as given Education/hands-on practice with today. Pt gave verbal understanding of the increased risk for coughing/"strangling" if drinking too much, too fast.  Recommend a trial of Regular diet but w/ cut meats for ease of eating and d/t RA in hands restricting use for such tasks as cutting per pt; Thin liquids via Cup. Recommend aspiration precautions to include No talking when eating/drinking and No straws when drinking liquids. Recommend Pills Whole in Puree; tray setup and positioning assistance for meals.  ST services will monitor pt's status and toleration of diet while admitted; provide Education for follow through w/ precautions. NSG/MD updated. Precautions posted at bedside.  Re: pt's Cognitive-linguistic status and needs, recommend f/u at her next venue of care, as any Cognitive decline can impact communication abilities. F/u for Cognitive intervention could be beneficial for cognitive-linguistic abilities in ADLs in her D/C environment. Recommend f/u w/ Neurology for any formal assessment of pt's Cognitive function/dx.       HPI HPI: Rhonda Davenport is a 82 y.o. 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 who presents with difficulty walking that started two nights ago.  She states that she woke up in the middle the night (10/18/2023) and felt the need to vomit, and when she tried to stand up she was unable to walk.  Since that time, she has noticed some improvement both in her walking and her nausea.  She does complain of a bifrontal headache, stating that this is typical of  her migraines. MRI (10/19/2023) revealed 1. Motion degraded exam.  2. Evolving subacute right PICA distribution infarct. No visible  associated hemorrhage or significant regional mass effect.  3. Underlying  age-related cerebral atrophy with moderately advanced  chronic microvascular ischemic disease, with remote hemorrhagic  lacunar infarct at the right basal ganglia.      SLP Plan  Continue with current plan of care      Recommendations for follow up therapy are one component of a multi-disciplinary discharge planning process, led by the attending physician.  Recommendations may be updated based on patient status, additional functional criteria and insurance authorization.    Recommendations  Diet recommendations: Regular;Thin liquid (pt is requesting a fruit platter and salad - wants to try) Liquids provided via: Cup;No straw Medication Administration: Whole meds with puree Supervision: Patient able to self feed;Intermittent supervision to cue for compensatory strategies (setup and sitting up) Compensations: Minimize environmental distractions;Slow rate;Small sips/bites;Multiple dry swallows after each bite/sip;Lingual sweep for clearance of pocketing Postural Changes and/or Swallow Maneuvers: Out of bed for meals;Seated upright 90 degrees;Upright 30-60 min after meal                 (Dietician) Oral care BID;Patient independent with oral care (denture care support)   Intermittent Supervision/Assistance Dysphagia, unspecified (R13.10)     Continue with current plan of care        Jerilynn Som, MS, CCC-SLP Speech Language Pathologist Rehab Services; [facility] [place] [phone] (ascom) Mialee Weyman  10/22/2023, 2:50 PM

## 2023-10-22 NOTE — Plan of Care (Signed)

## 2023-10-22 NOTE — Progress Notes (Signed)
PROGRESS NOTE  RHAVEN HEITZ    DOB: 1941-08-20, 82 y.o.  WUJ:[id number]    Code Status: Full Code   DOA: 10/18/2023   LOS: 3   Brief hospital course  Rhonda Davenport is a 82 y.o. female with medical history significant of CVA/TIA, HFrEF with CAD followed by [facility] cardiology, chronic C1 dens fracture, Rheumatoid arthritis followed by Dr. Deanne Coffer locally, CKD, hypothyroidism presenting with CVA with LE weakness. Has baseline lower extremity weakness that seem to have gotten significantly worsened especially since yesterday. Reports chronic back pain and weakness in the setting of chronic C1 fracture. Presented to the ER afebrile, hemodynamically stable.  Satting well on room air.  White count 8.7, hemoglobin 11.1, platelets 356, creatinine 1.1, troponin 20.  EKG normal sinus rhythm.  CT head within normal limits.  Noted interval worsening of resorption of the dens.  MRI C-spine showing chronic severe cervical spine degeneration around C5 and C6 with unhealed C1 ring fracture.  Positive degenerative stenosis from cervical medullary junction through T1-T2.  Also with subacute appearing right PICA cerebellar infarct with developing encephalomalacia and laminar necrosis. Brain MRI: Evolving subacute right PICA distribution infarct.  Neurology recommended aspirin and plavix. No further workup at this time.  PT/OT evaluated and recommended HH. Her facility will not accept her back at current level of assistance needed so TOC has started bed search.   Assessment & Plan  Principal Problem:   CVA (cerebral vascular accident) (HCC) Active Problems:   Leg weakness, bilateral   Chronic kidney disease (CKD) stage G3b/A3, moderately decreased glomerular filtration rate (GFR) between 30-44 mL/min/1.73 square meter and albuminuria creatinine ratio greater than 300 mg/g (HCC)   Chronic systolic heart failure (HCC)   Essential hypertension   GERD (gastroesophageal reflux disease)   Rheumatoid arthritis  involving multiple joints (HCC)   C1 cervical fracture (HCC)   Acute CVA (cerebrovascular accident) (HCC)  Leg weakness, bilateral Acute CVA Brain MRI: Evolving subacute right PICA distribution infarct.  Endorses improvement in her LE weakness. Echo unremarkable.  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 Marianna Fuss cardiology  Appears euvolemic to clinically dry Continue home regimen for now Monitor   Chronic kidney disease (CKD) stage G3b/A3, moderately decreased glomerular filtration rate (GFR) between 30-44 mL/min/1.73 square meter and albuminuria creatinine ratio greater than 300 mg/g (HCC) Creatinine 1.1 with GFR in the 50s Monitor  Body mass index is 19.68 kg/m.  VTE ppx: enoxaparin (LOVENOX) injection 30 mg Start: 10/19/23 2200  Diet:     Diet   DIET DYS 3 Room service appropriate? Yes with Assist; Fluid consistency: Thin   Consultants: Neurology   Subjective 10/22/23    Pt reports doing well. She has no complaints and really wants to leave today. Wants to be discharged as soon as possible. She is able to get to bedside commode on her own and ambulate with  a walker.    Objective   Vitals:   10/21/23 1911 10/21/23 2013 10/22/23 0000 10/22/23 0400  BP: 115/78 119/78 112/67 136/78  Pulse:  88 85 75 79  Resp: 16 18 18 18   Temp: 98.6 F (37 C) 98.8 F (37.1 C) 97.9 F (36.6 C) 99.1 F (37.3 C)  TempSrc:  Oral Oral Oral  SpO2: 100% 98% 98% 97%  Weight:      Height:       No intake or output data in the 24 hours ending 10/22/23 0717 Filed Weights   10/19/23 0842  Weight: 38.4 kg    Physical Exam:  General: awake, alert, NAD HEENT: atraumatic, clear conjunctiva, anicteric sclera, MMM, hearing grossly normal Respiratory: normal respiratory effort. Cardiovascular: quick capillary refill, normal S1/S2, RRR, no JVD, murmurs Nervous: A&O x3. Moving all extremities. normal speech Extremities: no edema, normal tone Skin: dry, intact, normal temperature, normal color. No rashes, lesions or ulcers on exposed skin Psychiatry: normal mood, congruent affect  Labs   I have personally reviewed the following labs and imaging studies CBC    Component Value Date/Time   WBC 8.7 10/18/2023 1656   RBC 3.98 10/18/2023 1656   HGB 11.1 (L) 10/18/2023 1656   HCT 33.5 (L) 10/18/2023 1656   PLT 356 10/18/2023 1656   MCV 84.2 10/18/2023 1656   MCH 27.9 10/18/2023 1656   MCHC 33.1 10/18/2023 1656   RDW 11.9 10/18/2023 1656   LYMPHSABS 2.1 04/05/2023 1039   MONOABS 1.0 04/05/2023 1039   EOSABS 0.2 04/05/2023 1039   BASOSABS 0.0 04/05/2023 1039      Latest Ref Rng & Units 10/18/2023    4:56 PM 04/05/2023   10:39 AM 12/11/2022   10:42 AM  BMP  Glucose 70 - 99 mg/dL 299  98  89   BUN 8 - 23 mg/dL 28  14  17    Creatinine 0.44 - 1.00 mg/dL 3.71  6.96  7.89   Sodium 135 - 145 mmol/L 135  136  137   Potassium 3.5 - 5.1 mmol/L 3.9  4.1  5.1   Chloride 98 - 111 mmol/L 96  105  101   CO2 22 - 32 mmol/L 25  21  26    Calcium 8.9 - 10.3 mg/dL 38.1  8.9  01.7     No results found.  Disposition Plan & Communication  Patient status: Inpatient  Admitted From: ALF Planned disposition location: Assisted living Anticipated discharge date: 11/13 pending facility acceptance  Family  Communication: brother at bedside    Author: Leeroy Bock, DO [facility] 10/22/2023, 7:17 AM   Available by Epic secure chat 7AM-7PM. If 7PM-7AM, please contact night-coverage.  [facility] contact information found on [email or web address].

## 2023-10-22 NOTE — Progress Notes (Signed)
Mobility Specialist - Progress Note   10/22/23 1400  Mobility  Activity Stood at bedside;Transferred from chair to bed  Level of Assistance Standby assist, set-up cues, supervision of patient - no hands on  Assistive Device Front wheel walker  Distance Ambulated (ft) 2 ft  Activity Response Tolerated well  $Mobility charge 1 Mobility     Pt sitting in chair upon arrival, utilizing RA. Voicing fatigue. Pt transferred chair-bed with minG. Alarm set, needs in reach   Filiberto Pinks Mobility Specialist 10/22/23, 2:20 PM

## 2023-10-22 NOTE — Plan of Care (Signed)
  Problem: Education: Goal: Knowledge of General Education information will improve Description: Including pain rating scale, medication(s)/side effects and non-pharmacologic comfort measures 10/22/2023 0525 by Jennelle Human, RN Outcome: Progressing 10/22/2023 0521 by Jennelle Human, RN Outcome: Progressing   Problem: Clinical Measurements: Goal: Ability to maintain clinical measurements within normal limits will improve Outcome: Progressing Goal: Diagnostic test results will improve Outcome: Progressing   Problem: Activity: Goal: Risk for activity intolerance will decrease Outcome: Progressing   Problem: Nutrition: Goal: Adequate nutrition will be maintained Outcome: Progressing   Problem: Coping: Goal: Level of anxiety will decrease Outcome: Progressing   Problem: Pain Management: Goal: General experience of comfort will improve Outcome: Progressing   Problem: Safety: Goal: Ability to remain free from injury will improve Outcome: Progressing

## 2023-10-22 NOTE — Progress Notes (Signed)
Occupational Therapy Treatment Patient Details Name: Rhonda Davenport MRN: [id number] DOB: May 25, 1941 Today's Date: 10/22/2023   History of present illness Rhonda Davenport is an 82 y.o. female who presented to the ED with c/o of nausea, vomiting, and recent fall. MRI showed Evidence of C7-T1 interspinous ligament injury as well as Subacute appearing Right PICA Cerebellar Infarct. PMH includes: rheumatoid arthritis, iron deficient anemia, GERD, gait disorder, Still's disease, CHF, CKD, seizure.   OT comments  Rhonda Davenport was seen for OT treatment on this date. Upon arrival to room pt seated in chair, agreeable to tx. Pt requires SUPERVISION + RW for ADL t/f ~400 ft, toileting including pericare and clothing mgmt, and hand washing. A&O x4, no cues for safety. Pt making good progress toward goals, will continue to follow POC. Discharge recommendation remains appropriate.        If plan is discharge home, recommend the following:  A little help with walking and/or transfers;A little help with bathing/dressing/bathroom;Assistance with cooking/housework;Assist for transportation;Help with stairs or ramp for entrance   Equipment Recommendations  None recommended by OT    Recommendations for Other Services      Precautions / Restrictions Precautions Precautions: Fall Restrictions Weight Bearing Restrictions: No       Mobility Bed Mobility               General bed mobility comments: NT- seated in recliner upon entry    Transfers Overall transfer level: Needs assistance Equipment used: Rolling walker (2 wheels) Transfers: Sit to/from Stand Sit to Stand: Supervision                 Balance Overall balance assessment: Needs assistance Sitting-balance support: Feet supported Sitting balance-Leahy Scale: Good     Standing balance support: No upper extremity supported, During functional activity Standing balance-Leahy Scale: Good                             ADL  either performed or assessed with clinical judgement   ADL Overall ADL's : Needs assistance/impaired                                       General ADL Comments: SUPERVISION + RW for ADL t/f ~400 ft, toileting including pericare and clothing mgmt, and hand washing      Cognition Arousal: Alert Behavior During Therapy: WFL for tasks assessed/performed Overall Cognitive Status: Within Functional Limits for tasks assessed                                                     Pertinent Vitals/ Pain       Pain Assessment Pain Assessment: No/denies pain   Frequency  Min 1X/week        Progress Toward Goals  OT Goals(current goals can now be found in the care plan section)  Progress towards OT goals: Progressing toward goals  Acute Rehab OT Goals Patient Stated Goal: to go home OT Goal Formulation: With patient Time For Goal Achievement: 11/02/23 Potential to Achieve Goals: Good ADL Goals Pt Will Perform Grooming: standing;with modified independence;with adaptive equipment Pt Will Perform Lower Body Dressing: sit to/from stand;with modified independence;with adaptive equipment Pt Will Transfer to Toilet: with  modified independence;ambulating;regular height toilet;bedside commode Pt Will Perform Toileting - Clothing Manipulation and hygiene: sit to/from stand;with modified independence;with caregiver independent in assisting;with adaptive equipment  Plan      Co-evaluation                 AM-PAC OT "6 Clicks" Daily Activity     Outcome Measure   Help from another person eating meals?: None Help from another person taking care of personal grooming?: A Little Help from another person toileting, which includes using toliet, bedpan, or urinal?: A Little Help from another person bathing (including washing, rinsing, drying)?: A Lot Help from another person to put on and taking off regular upper body clothing?: A Little Help from  another person to put on and taking off regular lower body clothing?: A Lot 6 Click Score: 17    End of Session Equipment Utilized During Treatment: Rolling walker (2 wheels)  OT Visit Diagnosis: Other abnormalities of gait and mobility (R26.89);History of falling (Z91.81);Other symptoms and signs involving the nervous system (R29.898)   Activity Tolerance Patient tolerated treatment well   Patient Left in chair;with call bell/phone within reach;with chair alarm set   Nurse Communication          Time: 5366-4403 OT Time Calculation (min): 18 min  Charges: OT General Charges $OT Visit: 1 Visit OT Treatments $Self Care/Home Management : 8-22 mins  Rhonda Davenport, M.S. OTR/L  10/22/23, 1:01 PM  ascom [phone]

## 2023-10-22 NOTE — TOC Progression Note (Addendum)
Transition of Care [facility]) - Progression Note    Patient Details  Name: Rhonda Davenport MRN: [id number] Date of Birth: 24-Jun-1941  Transition [facility]) CM/SW Contact  Allena Katz, LCSW Phone Number: 10/22/2023, 11:18 AM  Clinical Narrative:     Met with patient and family at bedside to inform that Waynetta Sandy [phone] over [facility] reports that they are unable to take patient back due to [facility]. They are stating they could potentially take for memory care but patient is not wanting that workup to be done. CSW explained to patient she does not qualify for SNF level of care. Pt has only been at [facility] for a month and states that she wants to transition to an ALF in [facility] as this was where she was from. Family at bedside encouraged patient to consider [place] area as it would be closer for them and they were going to [place] 3 times a day to walk patients dog Lucky. Pt reports that her friend that is keeping lucky would have to keep [place] permanently. CSW spoke with family  and patient about coordinating with placement agencies to assist with placement. CSW provided options for [facility] and [facility]. Pt understands they are not affiliated with [facility]. Pt reports no preference just for someone who can get her to an ALF in [facility]. CSW spoke with Waynetta Sandy at [facility] to let her know.      Expected Discharge Plan and Services                                               Social Determinants of Health (SDOH) Interventions SDOH Screenings   Food Insecurity: No Food Insecurity (10/19/2023)  Housing: Low Risk  (10/19/2023)  Transportation Needs: No Transportation Needs (10/19/2023)  Utilities: Not At Risk (10/19/2023)  Tobacco Use: Medium Risk (10/18/2023)    Readmission Risk Interventions     No data to display

## 2023-10-23 DIAGNOSIS — I639 Cerebral infarction, unspecified: Secondary | ICD-10-CM | POA: Diagnosis not present

## 2023-10-23 DIAGNOSIS — N1832 Chronic kidney disease, stage 3b: Secondary | ICD-10-CM

## 2023-10-23 DIAGNOSIS — M069 Rheumatoid arthritis, unspecified: Secondary | ICD-10-CM | POA: Diagnosis not present

## 2023-10-23 MED ORDER — LEVOTHYROXINE SODIUM 50 MCG PO TABS
50.0000 ug | ORAL_TABLET | Freq: Every day | ORAL | Status: DC
  Administered 2023-10-24 – 2023-10-25 (×2): 50 ug via ORAL
  Filled 2023-10-23 (×2): qty 1

## 2023-10-23 MED ORDER — BUSPIRONE HCL 10 MG PO TABS
15.0000 mg | ORAL_TABLET | Freq: Two times a day (BID) | ORAL | Status: DC
  Administered 2023-10-23 – 2023-10-25 (×5): 15 mg via ORAL
  Filled 2023-10-23 (×5): qty 2

## 2023-10-23 NOTE — TOC Progression Note (Signed)
Transition of Care [facility]) - Progression Note    Patient Details  Name: Rhonda Davenport MRN: [id number] Date of Birth: 04-11-1941  Transition of Care [facility]) CM/SW Contact  Allena Katz, LCSW Phone Number: 10/23/2023, 10:39 AM  Clinical Narrative:  CSW met pt at bedside. Pt had friend donna on the phone and requested she be added to the charts emergency contacts so we can speak with her. Family has been working with placement agency and would like [facility] in [place] and [facility] in [place]. Pt is agreeable and also reports those are some of her top choices. These facilities may be able to come and assess patient today.          Expected Discharge Plan and Services                                               Social Determinants of Health (SDOH) Interventions SDOH Screenings   Food Insecurity: No Food Insecurity (10/19/2023)  Housing: Low Risk  (10/19/2023)  Transportation Needs: No Transportation Needs (10/19/2023)  Utilities: Not At Risk (10/19/2023)  Tobacco Use: Medium Risk (10/18/2023)    Readmission Risk Interventions     No data to display

## 2023-10-23 NOTE — Progress Notes (Signed)
Per Dr Denton Lank, dc stroke/NIH orders

## 2023-10-23 NOTE — Progress Notes (Signed)
Physical Therapy Treatment Patient Details Name: Rhonda Davenport MRN: [id number] DOB: 09-17-1941 Today's Date: 10/23/2023   History of Present Illness Rhonda Davenport is an 82 y.o. female who presented to the ED with c/o of nausea, vomiting, and recent fall. MRI showed Evidence of C7-T1 interspinous ligament injury as well as Subacute appearing Right PICA Cerebellar Infarct. PMH includes: rheumatoid arthritis, iron deficient anemia, GERD, gait disorder, Still's disease, CHF, CKD, seizure.    PT Comments  Pt alert and oriented and receptive to therapy this afternoon. Pt continues to complain of chronic headache, but denies pain anywhere else. Pt performed transfers and ambulation with supervision and RW. She demonstrated mod I with bed mobility and is making good progress toward her mobility goals. She would continue to benefit from acute therapy to maximize functional independence and increase muscular/cardiovascular endurance.     If plan is discharge home, recommend the following: A little help with walking and/or transfers;A little help with bathing/dressing/bathroom;Assistance with cooking/housework;Direct supervision/assist for medications management;Direct supervision/assist for financial management;Assist for transportation;Help with stairs or ramp for entrance   Can travel by private vehicle      yes  Equipment Recommendations   (youth size RW)    Recommendations for Other Services       Precautions / Restrictions Precautions Precautions: Fall Restrictions Weight Bearing Restrictions: No     Mobility  Bed Mobility Overal bed mobility: Modified Independent             General bed mobility comments: sit > sup    Transfers Overall transfer level: Needs assistance Equipment used: Rolling walker (2 wheels) Transfers: Sit to/from Stand Sit to Stand: Supervision                Ambulation/Gait Ambulation/Gait assistance: Supervision Gait Distance (Feet): 320  Feet Assistive device: Rolling walker (2 wheels) Gait Pattern/deviations: Step-through pattern, Narrow base of support Gait velocity: decreased     General Gait Details: less difficulty with keeping RW stable this session   Stairs             Wheelchair Mobility     Tilt Bed    Modified Rankin (Stroke Patients Only)       Balance Overall balance assessment: Needs assistance Sitting-balance support: Feet supported, No upper extremity supported Sitting balance-Leahy Scale: Good     Standing balance support: No upper extremity supported, During functional activity Standing balance-Leahy Scale: Good Standing balance comment: able to sonn/tie robe with supervision in standing                            Cognition Arousal: Alert Behavior During Therapy: WFL for tasks assessed/performed Overall Cognitive Status: Within Functional Limits for tasks assessed                                          Exercises      General Comments        Pertinent Vitals/Pain Pain Assessment Pain Assessment: No/denies pain    Home Living                          Prior Function            PT Goals (current goals can now be found in the care plan section) Acute Rehab PT Goals Patient Stated Goal: to return  to ALF PT Goal Formulation: With patient Time For Goal Achievement: 11/02/23 Potential to Achieve Goals: Good Progress towards PT goals: Progressing toward goals    Frequency    7X/week      PT Plan      Co-evaluation              AM-PAC PT "6 Clicks" Mobility   Outcome Measure  Help needed turning from your back to your side while in a flat bed without using bedrails?: None Help needed moving from lying on your back to sitting on the side of a flat bed without using bedrails?: None Help needed moving to and from a bed to a chair (including a wheelchair)?: None Help needed standing up from a chair using your arms  (e.g., wheelchair or bedside chair)?: None Help needed to walk in hospital room?: None Help needed climbing 3-5 steps with a railing? : A Little 6 Click Score: 23    End of Session Equipment Utilized During Treatment: Gait belt Activity Tolerance: Patient tolerated treatment well Patient left: in bed;with call bell/phone within reach;with bed alarm set Nurse Communication: Mobility status PT Visit Diagnosis: Unsteadiness on feet (R26.81);Other abnormalities of gait and mobility (R26.89)     Time: 1610-9604 PT Time Calculation (min) (ACUTE ONLY): 22 min  Charges:    $Therapeutic Activity: 8-22 mins PT General Charges $$ ACUTE PT VISIT: 1 Visit                        Shauna Hugh, SPT 10/23/2023, 3:32 PM

## 2023-10-23 NOTE — Plan of Care (Signed)

## 2023-10-23 NOTE — Progress Notes (Signed)
PROGRESS NOTE  Rhonda Davenport    DOB: 07-30-1941, 82 y.o.  YQI:[id number]    Code Status: Full Code   DOA: 10/18/2023   LOS: 4   Brief hospital course  Rhonda Davenport is a 82 y.o. female with medical history significant of CVA/TIA, HFrEF with CAD followed by [facility] cardiology, chronic C1 dens fracture, Rheumatoid arthritis followed by Dr. Deanne Coffer locally, CKD, hypothyroidism presenting with CVA with LE weakness. Has baseline lower extremity weakness that seem to have gotten significantly worsened especially since yesterday. Reports chronic back pain and weakness in the setting of chronic C1 fracture. Presented to the ER afebrile, hemodynamically stable.  Satting well on room air.  White count 8.7, hemoglobin 11.1, platelets 356, creatinine 1.1, troponin 20.  EKG normal sinus rhythm.  CT head within normal limits.  Noted interval worsening of resorption of the dens.  MRI C-spine showing chronic severe cervical spine degeneration around C5 and C6 with unhealed C1 ring fracture.  Positive degenerative stenosis from cervical medullary junction through T1-T2.  Also with subacute appearing right PICA cerebellar infarct with developing encephalomalacia and laminar necrosis. Brain MRI: Evolving subacute right PICA distribution infarct.  Neurology recommended aspirin and plavix. No further workup at this time.   PT/OT evaluated and recommended HH. Her assisted living facility will not accept her back at current level of assistance needed so TOC has started bed search.    Assessment & Plan  Principal Problem:   CVA (cerebral vascular accident) (HCC) Active Problems:   Leg weakness, bilateral   Chronic kidney disease (CKD) stage G3b/A3, moderately decreased glomerular filtration rate (GFR) between 30-44 mL/min/1.73 square meter and albuminuria creatinine ratio greater than 300 mg/g (HCC)   Chronic systolic heart failure (HCC)   Essential hypertension   GERD (gastroesophageal reflux disease)    Rheumatoid arthritis involving multiple joints (HCC)   C1 cervical fracture (HCC)   Acute CVA (cerebrovascular accident) (HCC)  Leg weakness, bilateral Acute CVA Brain MRI: Evolving subacute right PICA distribution infarct.  Endorses improvement in her LE weakness. Echo unremarkable.  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 Marianna Fuss cardiology  Appears euvolemic to clinically dry Continue home regimen for now Monitor   Chronic kidney disease (CKD) stage G3b/A3, moderately decreased glomerular filtration rate (GFR) between 30-44 mL/min/1.73 square meter and albuminuria creatinine ratio greater than 300 mg/g (HCC) Creatinine 1.1 with GFR in the 50s Monitor  Body mass index is 19.68 kg/m.  VTE ppx: enoxaparin (LOVENOX) injection 30 mg Start: 10/19/23 2200  Diet:     Diet   Diet regular Room service appropriate? Yes with Assist; Fluid consistency: Thin   Consultants: Neurology   Subjective 10/23/23    Pt up in recliner when seen today. She reports a headache and neck pain. No family present at bedside.    Objective   Vitals:   10/22/23  1952 10/22/23 2352 10/23/23 0352 10/23/23 0754  BP: 100/61 120/70 102/62 129/74  Pulse: 85 77 82 83  Resp: 18 18 18 16   Temp: 98.4 F  (36.9 C) 98.4 F (36.9 C) 97.9 F (36.6 C) 98.1 F (36.7 C)  TempSrc:   Oral Oral  SpO2: 98% 100% 100% 99%  Weight:      Height:       No intake or output data in the 24 hours ending 10/23/23 1319 Filed Weights   10/19/23 0842  Weight: 38.4 kg    Physical Exam:  General exam: awake, alert, no acute distress, frail and chronically ill appearing HEENT: moist mucus membranes, hearing grossly normal  Respiratory system: CTAB, no wheezes, rales or rhonchi, normal respiratory effort. Cardiovascular system: normal S1/S2, RRR, no pedal edema.   Gastrointestinal system: soft, NT, ND Central nervous system: no gross focal neurologic deficits, normal speech Extremities: no edema, normal tone Skin: dry, intact, normal temperature Psychiatry: normal mood, congruent affect, mildly confused   Labs   I have personally reviewed the following labs and imaging studies CBC    Component Value Date/Time   WBC 8.7 10/18/2023 1656   RBC 3.98 10/18/2023 1656   HGB 11.1 (L) 10/18/2023 1656   HCT 33.5 (L) 10/18/2023 1656   PLT 356 10/18/2023 1656   MCV 84.2 10/18/2023 1656   MCH 27.9 10/18/2023 1656   MCHC 33.1 10/18/2023 1656   RDW 11.9 10/18/2023 1656   LYMPHSABS 2.1 04/05/2023 1039   MONOABS 1.0 04/05/2023 1039   EOSABS 0.2 04/05/2023 1039   BASOSABS 0.0 04/05/2023 1039      Latest Ref Rng & Units 10/18/2023    4:56 PM 04/05/2023   10:39 AM 12/11/2022   10:42 AM  BMP  Glucose 70 - 99 mg/dL 829  98  89   BUN 8 - 23 mg/dL 28  14  17    Creatinine 0.44 - 1.00 mg/dL 5.62  1.30  8.65   Sodium 135 - 145 mmol/L 135  136  137   Potassium 3.5 - 5.1 mmol/L 3.9  4.1  5.1   Chloride 98 - 111 mmol/L 96  105  101   CO2 22 - 32 mmol/L 25  21  26    Calcium 8.9 - 10.3 mg/dL 78.4  8.9  69.6     No results found.  Disposition Plan & Communication  Patient status: Inpatient  Admitted From: ALF Planned disposition location: Assisted living Anticipated discharge date: unknown.  medically stable &  pending placement  Patient's ALF declines for her to return, see TOC notes.   Family Communication: none at bedside.   No medical updates to provide at this time.  Placement pending.    Author: Pennie Banter, DO [facility] 10/23/2023, 1:19 PM   Available by Epic secure chat 7AM-7PM. If 7PM-7AM, please contact night-coverage.  [facility] contact information found on [email or web address].

## 2023-10-23 NOTE — Progress Notes (Signed)
Occupational Therapy Treatment Patient Details Name: Rhonda Davenport MRN: [id number] DOB: [birth year] Today's Date: 10/23/2023   History of present illness Rhonda Davenport is an 82 y.o. female who presented to the ED with c/o of nausea, vomiting, and recent fall. MRI showed Evidence of C7-T1 interspinous ligament injury as well as Subacute appearing Right PICA Cerebellar Infarct. PMH includes: rheumatoid arthritis, iron deficient anemia, GERD, gait disorder, Still's disease, CHF, CKD, seizure.   OT comments  Pt. sitting upright in the recliner upon arrival. Pt. with multiple complaints. Pt. Was assisted with the TV volume, the air temperature in the room, meal tray/table clean up, and hand hygiene. Pt. Was assisted with standing  to donn her robe requiring Supervision. Pt. was assisted with positioning in the recliner chair multiple times with pillows for comfort, as Pt. has difficulty with repositioning in the chair. Cues were provided for task redirection at times.  Pt. continues to benefit from OT services for ADL training, A/E training, and pt. education about DME. OT discharge  recommendation remains appropriate at this time.      If plan is discharge home, recommend the following:  A little help with walking and/or transfers;A little help with bathing/dressing/bathroom;Assistance with cooking/housework;Assist for transportation;Help with stairs or ramp for entrance   Equipment Recommendations       Recommendations for Other Services      Precautions / Restrictions Precautions Precautions: Fall Restrictions Weight Bearing Restrictions: No       Mobility Bed Mobility               General bed mobility comments: Pt. was seated in recliner upon entry    Transfers Overall transfer level: Needs assistance   Transfers: Sit to/from Stand Sit to Stand: Supervision                 Balance                                           ADL either performed  or assessed with clinical judgement   ADL Overall ADL's : Needs assistance/impaired     Grooming: Set up;Independent           Upper Body Dressing : Supervision/safety                          Extremity/Trunk Assessment Upper Extremity Assessment Upper Extremity Assessment: Generalized weakness            Vision Baseline Vision/History: 1 Wears glasses Ability to See in Adequate Light: 1 Impaired Patient Visual Report: No change from baseline     Perception     Praxis      Cognition Arousal: Alert Behavior During Therapy: WFL for tasks assessed/performed Overall Cognitive Status: Within Functional Limits for tasks assessed                                          Exercises      Shoulder Instructions       General Comments      Pertinent Vitals/ Pain       Pain Assessment Pain Assessment: 0-10  Home Living  Prior Functioning/Environment              Frequency  Min 1X/week        Progress Toward Goals  OT Goals(current goals can now be found in the care plan section)  Progress towards OT goals: Progressing toward goals  Acute Rehab OT Goals Patient Stated Goal: To go home OT Goal Formulation: With patient Time For Goal Achievement: 11/02/23 Potential to Achieve Goals: Good  Plan      Co-evaluation                 AM-PAC OT "6 Clicks" Daily Activity     Outcome Measure   Help from another person eating meals?: None Help from another person taking care of personal grooming?: A Little Help from another person toileting, which includes using toliet, bedpan, or urinal?: A Little Help from another person bathing (including washing, rinsing, drying)?: A Lot Help from another person to put on and taking off regular upper body clothing?: A Little Help from another person to put on and taking off regular lower body clothing?: A Lot 6 Click Score:  17    End of Session Equipment Utilized During Treatment: Gait belt;Rolling walker (2 wheels)  OT Visit Diagnosis: Other abnormalities of gait and mobility (R26.89);History of falling (Z91.81);Other symptoms and signs involving the nervous system (R29.898)   Activity Tolerance Patient tolerated treatment well   Patient Left in chair;with call bell/phone within reach;with chair alarm set   Nurse Communication Mobility status;Patient requests pain meds        Time: 9528-4132 OT Time Calculation (min): 16 min  Charges: OT General Charges $OT Visit: 1 Visit OT Treatments $Self Care/Home Management : 8-22 mins Olegario Messier, MS, OTR/L  Olegario Messier 10/23/2023, 9:49 AM

## 2023-10-23 NOTE — Progress Notes (Signed)
Mobility Specialist - Progress Note    10/23/23 0937  Mobility  Activity Ambulated with assistance in hallway;Stood at bedside  Level of Assistance Standby assist, set-up cues, supervision of patient - no hands on  Assistive Device Front wheel walker  Distance Ambulated (ft) 480 ft  Range of Motion/Exercises Active  Activity Response Tolerated well  Mobility Referral Yes  $Mobility charge 1 Mobility  Mobility Specialist Start Time (ACUTE ONLY) 0920  Mobility Specialist Stop Time (ACUTE ONLY) [id number]  Mobility Specialist Time Calculation (min) (ACUTE ONLY) 18 min   Pt resting in bed on RA upon entry. Pt STS and ambulates to hallway around NS SBA with RW for 3 laps. Pt endorses headache (RN notified) but not lightheadedness or dizziness. Pt maintains upright stability throughout session with no LOB. Pt returned to recliner and left with needs in reach. Chair alarm activated.   Johnathan Hausen Mobility Specialist 10/23/23, 9:40 AM

## 2023-10-24 ENCOUNTER — Inpatient Hospital Stay: Payer: Medicare Other

## 2023-10-24 DIAGNOSIS — I5022 Chronic systolic (congestive) heart failure: Secondary | ICD-10-CM | POA: Diagnosis not present

## 2023-10-24 DIAGNOSIS — I639 Cerebral infarction, unspecified: Secondary | ICD-10-CM | POA: Diagnosis not present

## 2023-10-24 DIAGNOSIS — I10 Essential (primary) hypertension: Secondary | ICD-10-CM

## 2023-10-24 DIAGNOSIS — M069 Rheumatoid arthritis, unspecified: Secondary | ICD-10-CM | POA: Diagnosis not present

## 2023-10-24 LAB — BASIC METABOLIC PANEL
Anion gap: 9 (ref 5–15)
BUN: 17 mg/dL (ref 8–23)
CO2: 19 mmol/L — ABNORMAL LOW (ref 22–32)
Calcium: 8.3 mg/dL — ABNORMAL LOW (ref 8.9–10.3)
Chloride: 108 mmol/L (ref 98–111)
Creatinine, Ser: 0.72 mg/dL (ref 0.44–1.00)
GFR, Estimated: >60 mL/min (ref >60)
Glucose, Bld: 91 mg/dL (ref 70–99)
Potassium: 4.6 mmol/L (ref 3.5–5.1)
Sodium: 136 mmol/L (ref 135–145)

## 2023-10-24 LAB — CBC
HCT: 31.1 % — ABNORMAL LOW (ref 36.0–46.0)
Hemoglobin: 10.1 g/dL — ABNORMAL LOW (ref 12.0–15.0)
MCH: 28.3 pg (ref 26.0–34.0)
MCHC: 32.5 g/dL (ref 30.0–36.0)
MCV: 87.1 fL (ref 80.0–100.0)
Platelets: 319 10*3/uL (ref 150–400)
RBC: 3.57 MIL/uL — ABNORMAL LOW (ref 3.87–5.11)
RDW: 12.6 % (ref 11.5–15.5)
WBC: 6.3 10*3/uL (ref 4.0–10.5)
nRBC: 0 % (ref 0.0–0.2)

## 2023-10-24 LAB — MAGNESIUM: Magnesium: 2.4 mg/dL (ref 1.7–2.4)

## 2023-10-24 MED ORDER — ONDANSETRON HCL 4 MG/2ML IJ SOLN
4.0000 mg | Freq: Once | INTRAMUSCULAR | Status: AC
  Administered 2023-10-24: 4 mg via INTRAVENOUS
  Filled 2023-10-24: qty 2

## 2023-10-24 MED ORDER — TRAZODONE HCL 50 MG PO TABS
50.0000 mg | ORAL_TABLET | Freq: Every day | ORAL | Status: DC
  Administered 2023-10-24: 50 mg via ORAL
  Filled 2023-10-24: qty 1

## 2023-10-24 NOTE — Plan of Care (Signed)
Problem: Education: Goal: Knowledge of General Education information will improve Description: Including pain rating scale, medication(s)/side effects and non-pharmacologic comfort measures 10/24/2023 1813 by Leandro Reasoner, RN Outcome: Progressing 10/24/2023 1559 by Leandro Reasoner, RN Outcome: Progressing   Problem: Health Behavior/Discharge Planning: Goal: Ability to manage health-related needs will improve 10/24/2023 1813 by Leandro Reasoner, RN Outcome: Progressing 10/24/2023 1559 by Leandro Reasoner, RN Outcome: Progressing   Problem: Clinical Measurements: Goal: Ability to maintain clinical measurements within normal limits will improve 10/24/2023 1813 by Leandro Reasoner, RN Outcome: Progressing 10/24/2023 1559 by Leandro Reasoner, RN Outcome: Progressing Goal: Will remain free from infection 10/24/2023 1813 by Leandro Reasoner, RN Outcome: Progressing 10/24/2023 1559 by Leandro Reasoner, RN Outcome: Progressing Goal: Diagnostic test results will improve 10/24/2023 1813 by Leandro Reasoner, RN Outcome: Progressing 10/24/2023 1559 by Leandro Reasoner, RN Outcome: Progressing Goal: Respiratory complications will improve 10/24/2023 1813 by Leandro Reasoner, RN Outcome: Progressing 10/24/2023 1559 by Leandro Reasoner, RN Outcome: Progressing Goal: Cardiovascular complication will be avoided 10/24/2023 1813 by Leandro Reasoner, RN Outcome: Progressing 10/24/2023 1559 by Leandro Reasoner, RN Outcome: Progressing   Problem: Activity: Goal: Risk for activity intolerance will decrease 10/24/2023 1813 by Leandro Reasoner, RN Outcome: Progressing 10/24/2023 1559 by Leandro Reasoner, RN Outcome: Progressing   Problem: Nutrition: Goal: Adequate nutrition will be maintained 10/24/2023 1813 by Leandro Reasoner, RN Outcome: Progressing 10/24/2023 1559 by Leandro Reasoner, RN Outcome: Progressing   Problem: Coping: Goal: Level of anxiety will decrease 10/24/2023 1813 by Leandro Reasoner, RN Outcome: Progressing 10/24/2023 1559 by Leandro Reasoner, RN Outcome: Progressing   Problem: Elimination: Goal: Will not experience complications related to bowel motility 10/24/2023 1813 by Leandro Reasoner, RN Outcome: Progressing 10/24/2023 1559 by Leandro Reasoner, RN Outcome: Progressing Goal: Will not experience complications related to urinary retention 10/24/2023 1813 by Leandro Reasoner, RN Outcome: Progressing 10/24/2023 1559 by Leandro Reasoner, RN Outcome: Progressing   Problem: Pain Management: Goal: General experience of comfort will improve 10/24/2023 1813 by Leandro Reasoner, RN Outcome: Progressing 10/24/2023 1559 by Leandro Reasoner, RN Outcome: Progressing   Problem: Safety: Goal: Ability to remain free from injury will improve 10/24/2023 1813 by Leandro Reasoner, RN Outcome: Progressing 10/24/2023 1559 by Leandro Reasoner, RN Outcome: Progressing   Problem: Skin Integrity: Goal: Risk for impaired skin integrity will decrease 10/24/2023 1813 by Leandro Reasoner, RN Outcome: Progressing 10/24/2023 1559 by Leandro Reasoner, RN Outcome: Progressing   Problem: Education: Goal: Knowledge of disease or condition will improve 10/24/2023 1813 by Leandro Reasoner, RN Outcome: Progressing 10/24/2023 1559 by Leandro Reasoner, RN Outcome: Progressing Goal: Knowledge of secondary prevention will improve (MUST DOCUMENT ALL) 10/24/2023 1813 by Leandro Reasoner, RN Outcome: Progressing 10/24/2023 1559 by Leandro Reasoner, RN Outcome: Progressing Goal: Knowledge of patient specific risk factors will improve Loraine Leriche N/A or DELETE if not current risk factor) 10/24/2023 1813 by Leandro Reasoner, RN Outcome: Progressing 10/24/2023 1559 by Leandro Reasoner, RN Outcome: Progressing   Problem: Ischemic Stroke/TIA Tissue Perfusion: Goal: Complications of ischemic stroke/TIA will be minimized 10/24/2023 1813 by Leandro Reasoner, RN Outcome: Progressing 10/24/2023 1559 by  Leandro Reasoner, RN Outcome: Progressing   Problem: Coping: Goal: Will verbalize positive feelings about self 10/24/2023 1813 by Leandro Reasoner, RN Outcome: Progressing 10/24/2023 1559 by Leandro Reasoner, RN Outcome: Progressing Goal: Will identify appropriate support needs 10/24/2023 1813 by Leandro Reasoner, RN Outcome: Progressing 10/24/2023 1559 by Leandro Reasoner, RN Outcome: Progressing   Problem: Health Behavior/Discharge Planning: Goal: Ability to manage health-related needs will improve 10/24/2023 1813 by Leandro Reasoner, RN Outcome: Progressing 10/24/2023 1559 by  Leandro Reasoner, RN Outcome: Progressing Goal: Goals will be collaboratively established with patient/family 10/24/2023 1813 by Leandro Reasoner, RN Outcome: Progressing 10/24/2023 1559 by Leandro Reasoner, RN Outcome: Progressing   Problem: Self-Care: Goal: Ability to participate in self-care as condition permits will improve 10/24/2023 1813 by Leandro Reasoner, RN Outcome: Progressing 10/24/2023 1559 by Leandro Reasoner, RN Outcome: Progressing Goal: Verbalization of feelings and concerns over difficulty with self-care will improve 10/24/2023 1813 by Leandro Reasoner, RN Outcome: Progressing 10/24/2023 1559 by Leandro Reasoner, RN Outcome: Progressing Goal: Ability to communicate needs accurately will improve 10/24/2023 1813 by Leandro Reasoner, RN Outcome: Progressing 10/24/2023 1559 by Leandro Reasoner, RN Outcome: Progressing   Problem: Nutrition: Goal: Risk of aspiration will decrease 10/24/2023 1813 by Leandro Reasoner, RN Outcome: Progressing 10/24/2023 1559 by Leandro Reasoner, RN Outcome: Progressing Goal: Dietary intake will improve 10/24/2023 1813 by Leandro Reasoner, RN Outcome: Progressing 10/24/2023 1559 by Leandro Reasoner, RN Outcome: Progressing

## 2023-10-24 NOTE — Progress Notes (Signed)
PROGRESS NOTE  Rhonda Davenport    DOB: Jun 04, 1941, 82 y.o.  WUX:[id number]    Code Status: Full Code   DOA: 10/18/2023   LOS: 5   Brief hospital course  Rhonda Davenport is a 82 y.o. female with medical history significant of CVA/TIA, HFrEF with CAD followed by [facility] cardiology, chronic C1 dens fracture, Rheumatoid arthritis followed by Dr. Deanne Coffer locally, CKD, hypothyroidism presenting with CVA with LE weakness. Has baseline lower extremity weakness that seem to have gotten significantly worsened especially since yesterday. Reports chronic back pain and weakness in the setting of chronic C1 fracture. Presented to the ER afebrile, hemodynamically stable.  Satting well on room air.  White count 8.7, hemoglobin 11.1, platelets 356, creatinine 1.1, troponin 20.  EKG normal sinus rhythm.  CT head within normal limits.  Noted interval worsening of resorption of the dens.  MRI C-spine showing chronic severe cervical spine degeneration around C5 and C6 with unhealed C1 ring fracture.  Positive degenerative stenosis from cervical medullary junction through T1-T2.  Also with subacute appearing right PICA cerebellar infarct with developing encephalomalacia and laminar necrosis. Brain MRI: Evolving subacute right PICA distribution infarct.  Neurology recommended aspirin and plavix. No further workup at this time.   PT/OT evaluated and recommended HH. Her assisted living facility will not accept her back at current level of assistance needed so TOC has started bed search.    Assessment & Plan  Principal Problem:   CVA (cerebral vascular accident) (HCC) Active Problems:   Leg weakness, bilateral   Chronic kidney disease (CKD) stage G3b/A3, moderately decreased glomerular filtration rate (GFR) between 30-44 mL/min/1.73 square meter and albuminuria creatinine ratio greater than 300 mg/g (HCC)   Chronic systolic heart failure (HCC)   Essential hypertension   GERD (gastroesophageal reflux disease)    Rheumatoid arthritis involving multiple joints (HCC)   C1 cervical fracture (HCC)   Acute CVA (cerebrovascular accident) (HCC)  Leg weakness, bilateral Acute CVA Brain MRI: Evolving subacute right PICA distribution infarct.  Endorses improvement in her LE weakness. Echo unremarkable.  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 Marianna Fuss cardiology  Appears euvolemic to clinically dry Continue home regimen for now Monitor   Chronic kidney disease (CKD) stage G3b/A3, moderately decreased glomerular filtration rate (GFR) between 30-44 mL/min/1.73 square meter and albuminuria creatinine ratio greater than 300 mg/g (HCC) Creatinine 1.1 with GFR in the 50s Monitor  Body mass index is 19.68 kg/m.  VTE ppx: enoxaparin (LOVENOX) injection 30 mg Start: 10/19/23 2200  Diet:     Diet   Diet regular Room service appropriate? Yes with Assist; Fluid consistency: Thin   Consultants: Neurology   Subjective 10/24/23    Pt up in recliner about to work with PT this AM.  She denies any complaints.  No acute events reported.   Objective   Vitals:  10/23/23 1633 10/23/23 2044 10/24/23 0554 10/24/23 0811  BP: 125/71 (!) 148/72 102/62 123/78  Pulse: 73 83 81 74  Resp: 16 20 18 15   Temp: 98.2 F  (36.8 C) 98.4 F (36.9 C) 98.2 F (36.8 C) 98.2 F (36.8 C)  TempSrc: Oral Oral Oral   SpO2: 100% 100% 100% 98%  Weight:      Height:        Intake/Output Summary (Last 24 hours) at 10/24/2023 1129 Last data filed at 10/24/2023 1047 Gross per 24 hour  Intake 120 ml  Output --  Net 120 ml   Filed Weights   10/19/23 0842  Weight: 38.4 kg    Physical Exam:  General exam: awake, alert, no acute distress, frail and chronically ill appearing HEENT: moist mucus membranes, hearing grossly normal  Respiratory system: on room air, normal respiratory effort. Cardiovascular system: RRR, no pedal edema.   Central nervous system: no gross focal neurologic deficits, normal speech Extremities: no edema, normal tone Skin: dry, intact, normal temperature Psychiatry: normal mood, flat affect   Labs   I have personally reviewed the following labs and imaging studies CBC    Component Value Date/Time   WBC 6.3 10/24/2023 0439   RBC 3.57 (L) 10/24/2023 0439   HGB 10.1 (L) 10/24/2023 0439   HCT 31.1 (L) 10/24/2023 0439   PLT 319 10/24/2023 0439   MCV 87.1 10/24/2023 0439   MCH 28.3 10/24/2023 0439   MCHC 32.5 10/24/2023 0439   RDW 12.6 10/24/2023 0439   LYMPHSABS 2.1 04/05/2023 1039   MONOABS 1.0 04/05/2023 1039   EOSABS 0.2 04/05/2023 1039   BASOSABS 0.0 04/05/2023 1039      Latest Ref Rng & Units 10/24/2023    4:39 AM 10/18/2023    4:56 PM 04/05/2023   10:39 AM  BMP  Glucose 70 - 99 mg/dL 91  621  98   BUN 8 - 23 mg/dL 17  28  14    Creatinine 0.44 - 1.00 mg/dL 3.08  6.57  8.46   Sodium 135 - 145 mmol/L 136  135  136   Potassium 3.5 - 5.1 mmol/L 4.6  3.9  4.1   Chloride 98 - 111 mmol/L 108  96  105   CO2 22 - 32 mmol/L 19  25  21    Calcium 8.9 - 10.3 mg/dL 8.3  96.2  8.9     No results found.  Disposition Plan & Communication  Patient status: Inpatient  Admitted From: ALF Planned disposition location: Assisted living Anticipated discharge date: unknown.  medically  stable & pending placement  Patient's ALF declines for her to return, see TOC notes.   Family Communication: none at bedside.   No medical updates to provide at this time.  Placement pending.    Author: Pennie Banter, DO [facility] 10/24/2023, 11:29 AM   Available by Epic secure chat 7AM-7PM. If 7PM-7AM, please contact night-coverage.  [facility] contact information found on [email or web address].

## 2023-10-24 NOTE — Plan of Care (Signed)

## 2023-10-24 NOTE — Progress Notes (Signed)
Physical Therapy Treatment Patient Details Name: Rhonda Davenport MRN: [id number] DOB: [birth year] Today's Date: 10/24/2023   History of Present Illness Rhonda Davenport is an 82 y.o. female who presented to the ED with c/o of nausea, vomiting, and recent fall. MRI showed Evidence of C7-T1 interspinous ligament injury as well as Subacute appearing Right PICA Cerebellar Infarct. PMH includes: rheumatoid arthritis, iron deficient anemia, GERD, gait disorder, Still's disease, CHF, CKD, seizure.    PT Comments  Pt alert and oriented, denies pain, and reports frustration with not having her needs met here ("no one ever comes to help me"), but she is receptive to participating in therapy. Pt able to stand without RW and tie robe with supervision and no LOB. Pt mod I to stand with RW and required CGA to amb today 2/2 reporting that her RW always veers to the L (PT checked and switched out her RW for one that rolls straight at end of session). Pt tolerated standing balance activities with varied UE support required (see high level balance details below). Pt would continue to benefit from skilled PT to maximize functional independence and progress toward mobility goals.    If plan is discharge home, recommend the following: A little help with walking and/or transfers;A little help with bathing/dressing/bathroom;Assistance with cooking/housework;Assist for transportation;Help with stairs or ramp for entrance   Can travel by private vehicle      yes  Equipment Recommendations  Other (comment) (youth size RW)    Recommendations for Other Services       Precautions / Restrictions Precautions Precautions: Fall Restrictions Weight Bearing Restrictions: No     Mobility  Bed Mobility               General bed mobility comments: NT- pt in recliner    Transfers Overall transfer level: Needs assistance Equipment used: Rolling walker (2 wheels), None Transfers: Sit to/from Stand Sit to Stand:  Supervision, Modified independent (Device/Increase time)           General transfer comment: supervision to stand without AD; mod I to stand with RW    Ambulation/Gait Ambulation/Gait assistance: Contact guard assist Gait Distance (Feet): 320 Feet Assistive device: Rolling walker (2 wheels) Gait Pattern/deviations: Step-through pattern, Narrow base of support       General Gait Details: CGA to keep RW straight, frustrated with RW veering L, so declined further mobility- PT switched out youth RW for one that rolls straight forward at end of session, pt very grateful   Stairs             Wheelchair Mobility     Tilt Bed    Modified Rankin (Stroke Patients Only)       Balance Overall balance assessment: Needs assistance Sitting-balance support: Feet supported, No upper extremity supported Sitting balance-Leahy Scale: Good     Standing balance support: No upper extremity supported, During functional activity, Bilateral upper extremity supported   Standing balance comment: able to tie robe in standing with supervision               High Level Balance Comments: able to stand without UE support for 1 min with supervision for slight postural sway; required U/L UE support for standing functional forward reach; B/L UE support for standing marches x 10 and mini squats x 10            Cognition Arousal: Alert Behavior During Therapy: WFL for tasks assessed/performed Overall Cognitive Status: Within Functional Limits for tasks assessed  Exercises      General Comments        Pertinent Vitals/Pain Pain Assessment Pain Assessment: No/denies pain    Home Living                          Prior Function            PT Goals (current goals can now be found in the care plan section) Acute Rehab PT Goals Patient Stated Goal: to return to ALF PT Goal Formulation: With patient Time For  Goal Achievement: 11/02/23 Potential to Achieve Goals: Good Progress towards PT goals: Progressing toward goals    Frequency    Min 1X/week      PT Plan      Co-evaluation              AM-PAC PT "6 Clicks" Mobility   Outcome Measure  Help needed turning from your back to your side while in a flat bed without using bedrails?: None Help needed moving from lying on your back to sitting on the side of a flat bed without using bedrails?: None Help needed moving to and from a bed to a chair (including a wheelchair)?: None Help needed standing up from a chair using your arms (e.g., wheelchair or bedside chair)?: None Help needed to walk in hospital room?: A Little Help needed climbing 3-5 steps with a railing? : A Little 6 Click Score: 22    End of Session Equipment Utilized During Treatment: Gait belt Activity Tolerance: Patient tolerated treatment well Patient left: with chair alarm set;in chair;with call bell/phone within reach Nurse Communication: Mobility status PT Visit Diagnosis: Unsteadiness on feet (R26.81);Other abnormalities of gait and mobility (R26.89)     Time: 1610-9604 PT Time Calculation (min) (ACUTE ONLY): 13 min  Charges:    $Gait Training: 8-22 mins PT General Charges $$ ACUTE PT VISIT: 1 Visit                        Shauna Hugh, SPT 10/24/2023, 11:25 AM

## 2023-10-24 NOTE — TOC Progression Note (Addendum)
Transition of Care [facility]) - Progression Note    Patient Details  Name: Rhonda Davenport MRN: [id number] Date of Birth: 19-Jul-1941  [facility] CM/SW Contact  Allena Katz, LCSW Phone Number: 10/24/2023, 11:14 AM  Clinical Narrative:  Pt has been accepted at [facility].  CSW has requested a TB test be ordered. Facility cannot take until Monday. CSW will discuss with patient if she is able to stay with family.   CSW spoke with patient who is agreeable to plan TB blood test ordered. Pt is going to follow up with Sussanna her niece to see if she is able to stay with her tomorrow.        Expected Discharge Plan and Services                                               Social Determinants of Health (SDOH) Interventions SDOH Screenings   Food Insecurity: No Food Insecurity (10/19/2023)  Housing: Low Risk  (10/19/2023)  Transportation Needs: No Transportation Needs (10/19/2023)  Utilities: Not At Risk (10/19/2023)  Tobacco Use: Medium Risk (10/18/2023)    Readmission Risk Interventions     No data to display

## 2023-10-25 DIAGNOSIS — N1832 Chronic kidney disease, stage 3b: Secondary | ICD-10-CM | POA: Diagnosis not present

## 2023-10-25 DIAGNOSIS — M069 Rheumatoid arthritis, unspecified: Secondary | ICD-10-CM | POA: Diagnosis not present

## 2023-10-25 DIAGNOSIS — I5022 Chronic systolic (congestive) heart failure: Secondary | ICD-10-CM | POA: Diagnosis not present

## 2023-10-25 DIAGNOSIS — I10 Essential (primary) hypertension: Secondary | ICD-10-CM | POA: Diagnosis not present

## 2023-10-25 MED ORDER — LACTULOSE 10 GM/15ML PO SOLN: 10.0000 g | Freq: Every day | ORAL | Status: DC | PRN

## 2023-10-25 MED ORDER — HYDROCORTISONE 1 % EX CREA: TOPICAL_CREAM | Freq: Three times a day (TID) | CUTANEOUS | Status: DC | PRN

## 2023-10-25 MED ORDER — OXYCODONE-ACETAMINOPHEN 5-325 MG PO TABS
1.0000 | ORAL_TABLET | Freq: Two times a day (BID) | ORAL | Status: DC
  Administered 2023-10-25: 1 via ORAL
  Filled 2023-10-25: qty 1

## 2023-10-25 MED ORDER — ASPIRIN 81 MG PO TBEC: 81.0000 mg | DELAYED_RELEASE_TABLET | Freq: Every day | ORAL | Status: AC

## 2023-10-25 MED ORDER — DOCUSATE SODIUM 100 MG PO CAPS: 100.0000 mg | ORAL_CAPSULE | Freq: Every day | ORAL | Status: DC

## 2023-10-25 MED ORDER — PRAVASTATIN SODIUM 20 MG PO TABS
40.0000 mg | ORAL_TABLET | Freq: Every day | ORAL | Status: DC
  Administered 2023-10-25: 40 mg via ORAL
  Filled 2023-10-25: qty 2

## 2023-10-25 MED ORDER — FLUTICASONE PROPIONATE 50 MCG/ACT NA SUSP
2.0000 | Freq: Every day | NASAL | Status: DC
Start: 2023-10-25 — End: 2023-10-25
  Administered 2023-10-25: 2 via NASAL
  Filled 2023-10-25: qty 16

## 2023-10-25 NOTE — Plan of Care (Signed)
  Problem: Education: Goal: Knowledge of General Education information will improve Description: Including pain rating scale, medication(s)/side effects and non-pharmacologic comfort measures Outcome: Adequate for Discharge   Problem: Health Behavior/Discharge Planning: Goal: Ability to manage health-related needs will improve Outcome: Adequate for Discharge   Problem: Clinical Measurements: Goal: Ability to maintain clinical measurements within normal limits will improve Outcome: Adequate for Discharge Goal: Will remain free from infection Outcome: Adequate for Discharge Goal: Diagnostic test results will improve Outcome: Adequate for Discharge Goal: Respiratory complications will improve Outcome: Adequate for Discharge Goal: Cardiovascular complication will be avoided Outcome: Adequate for Discharge   Problem: Activity: Goal: Risk for activity intolerance will decrease Outcome: Adequate for Discharge   Problem: Nutrition: Goal: Adequate nutrition will be maintained Outcome: Adequate for Discharge   Problem: Coping: Goal: Level of anxiety will decrease Outcome: Adequate for Discharge   Problem: Elimination: Goal: Will not experience complications related to bowel motility Outcome: Adequate for Discharge Goal: Will not experience complications related to urinary retention Outcome: Adequate for Discharge   Problem: Pain Management: Goal: General experience of comfort will improve Outcome: Adequate for Discharge   Problem: Safety: Goal: Ability to remain free from injury will improve Outcome: Adequate for Discharge   Problem: Skin Integrity: Goal: Risk for impaired skin integrity will decrease Outcome: Adequate for Discharge   Problem: Education: Goal: Knowledge of disease or condition will improve Outcome: Adequate for Discharge Goal: Knowledge of secondary prevention will improve (MUST DOCUMENT ALL) Outcome: Adequate for Discharge Goal: Knowledge of patient  specific risk factors will improve Loraine Leriche N/A or DELETE if not current risk factor) Outcome: Adequate for Discharge   Problem: Ischemic Stroke/TIA Tissue Perfusion: Goal: Complications of ischemic stroke/TIA will be minimized Outcome: Adequate for Discharge   Problem: Coping: Goal: Will verbalize positive feelings about self Outcome: Adequate for Discharge Goal: Will identify appropriate support needs Outcome: Adequate for Discharge   Problem: Health Behavior/Discharge Planning: Goal: Ability to manage health-related needs will improve Outcome: Adequate for Discharge Goal: Goals will be collaboratively established with patient/family Outcome: Adequate for Discharge   Problem: Self-Care: Goal: Ability to participate in self-care as condition permits will improve Outcome: Adequate for Discharge Goal: Verbalization of feelings and concerns over difficulty with self-care will improve Outcome: Adequate for Discharge Goal: Ability to communicate needs accurately will improve Outcome: Adequate for Discharge   Problem: Nutrition: Goal: Risk of aspiration will decrease Outcome: Adequate for Discharge Goal: Dietary intake will improve Outcome: Adequate for Discharge   Problem: Acute Rehab OT Goals (only OT should resolve) Goal: Pt. Will Perform Grooming Outcome: Adequate for Discharge Goal: Pt. Will Perform Lower Body Dressing Outcome: Adequate for Discharge Goal: Pt. Will Transfer To Toilet Outcome: Adequate for Discharge Goal: Pt. Will Perform Toileting-Clothing Manipulation Outcome: Adequate for Discharge   Problem: Acute Rehab PT Goals(only PT should resolve) Goal: Pt Will Perform Standing Balance Or Pre-Gait Outcome: Adequate for Discharge Goal: Pt Will Ambulate Outcome: Adequate for Discharge Goal: Pt/caregiver will Perform Home Exercise Program Outcome: Adequate for Discharge   Problem: SLP Dysphagia Goals Goal: Misc Dysphagia Goal Outcome: Adequate for Discharge

## 2023-10-25 NOTE — Discharge Summary (Signed)
Physician Discharge Summary   Patient: Rhonda Davenport MRN: [id number] DOB: [birth year]  Admit date:     10/18/2023  Discharge date: 10/25/23  Discharge Physician: Pennie Banter   PCP: Andreas Blower., MD   Recommendations at discharge:    Follow up with Primary Care in 1-2 weeks Repeat CBC, BMP, Mg at follow up Follow up on blood pressure. Multiple medications held during admission with controlled BPs are discontinued on admission to prevent hypotension  Discharge Diagnoses: Principal Problem:   CVA (cerebral vascular accident) (HCC) Active Problems:   Leg weakness, bilateral   Chronic kidney disease (CKD) stage G3b/A3, moderately decreased glomerular filtration rate (GFR) between 30-44 mL/min/1.73 square meter and albuminuria creatinine ratio greater than 300 mg/g (HCC)   Chronic systolic heart failure (HCC)   Essential hypertension   GERD (gastroesophageal reflux disease)   Rheumatoid arthritis involving multiple joints (HCC)   C1 cervical fracture (HCC)   Acute CVA (cerebrovascular accident) (HCC)  Resolved Problems:   * No resolved hospital problems. *  Hospital Course:  Rhonda Davenport is a 82 y.o. female with medical history significant of CVA/TIA, HFrEF with CAD followed by [facility] cardiology, chronic C1 dens fracture, Rheumatoid arthritis followed by Dr. Deanne Coffer locally, CKD, hypothyroidism presenting with CVA with LE weakness. Has baseline lower extremity weakness that seem to have gotten significantly worsened especially since yesterday. Reports chronic back pain and weakness in the setting of chronic C1 fracture. Presented to the ER afebrile, hemodynamically stable.  Satting well on room air.  White count 8.7, hemoglobin 11.1, platelets 356, creatinine 1.1, troponin 20.  EKG normal sinus rhythm.  CT head within normal limits.  Noted interval worsening of resorption of the dens.  MRI C-spine showing chronic severe cervical spine degeneration around C5 and C6 with  unhealed C1 ring fracture.  Positive degenerative stenosis from cervical medullary junction through T1-T2.  Also with subacute appearing right PICA cerebellar infarct with developing encephalomalacia and laminar necrosis. Brain MRI: Evolving subacute right PICA distribution infarct.  Neurology recommended aspirin and plavix. No further workup at this time.    PT/OT evaluated and recommended HH.   Further hospital course and management as outlined below.   11/15 --- pt medically stable and accepted for discharge to [facility] today.    Assessment and Plan:  Leg weakness, bilateral Acute CVA Brain MRI: Evolving subacute right PICA distribution infarct.  LE weakness now improved.  Echo unremarkable.  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 Marianna Fuss cardiology  Appears euvolemic to clinically dry Continue home regimen for now Monitor   Chronic kidney disease (CKD) stage G3b/A3, moderately decreased glomerular filtration rate (GFR) between 30-44 mL/min/1.73 square meter and albuminuria creatinine ratio greater than 300 mg/g (HCC) Creatinine 1.1  with GFR in the 50s Monitor       Consultants:  Neurology Procedures performed: Echo - see report  Disposition: Assisted living  Diet recommendation:  Discharge Diet Orders (From admission, onward)     Start     Ordered   10/25/23 0000  Diet - low sodium heart healthy        10/25/23 1144            DISCHARGE MEDICATION: Allergies as of 10/25/2023       Reactions   Ibuprofen Itching   Oxycodone    Other reaction(s): Lethargy (intolerance), Malaise (intolerance) "sleeps for 2 days"   Tramadol Itching   Atorvastatin    Other reaction(s): Myalgias (intolerance)   Ceftriaxone Other (See Comments)   Benztropine Rash   Codeine Palpitations   Diphenhydramine Hcl Itching   Tolerates ATARAX   Hydrocodone-acetaminophen Rash   Leflunomide Rash   Meloxicam Itching        Medication List     STOP taking these medications    amLODipine 2.5 MG tablet Commonly known as: NORVASC   furosemide 20 MG tablet Commonly known as: LASIX   losartan 25 MG tablet Commonly known as: COZAAR   metoprolol tartrate 25 MG tablet Commonly known as: LOPRESSOR       TAKE these medications    acetaminophen 500 MG tablet Commonly known as: TYLENOL Take 1,000 mg by mouth every 8 (eight) hours as needed for mild pain (pain score 1-3) or moderate pain (pain score 4-6). Give 2 tablets (1g) every 8 hours as needed for pain management   aspirin EC 81 MG tablet Take 1 tablet (81 mg total) by mouth daily. Swallow whole. Start taking on: October 26, 2023   busPIRone 10 MG tablet Commonly known as: BUSPAR Take 15 mg by mouth 2 (two) times daily.   calcium carbonate 500 MG chewable tablet Commonly known as: TUMS - dosed in mg elemental calcium Chew 1 tablet by mouth daily as needed for indigestion. Takes PRN for indigestion   clopidogrel 75 MG tablet Commonly known as: PLAVIX Take 1 tablet by mouth daily.   docusate sodium 100 MG capsule Commonly known as: COLACE Take 100 mg by mouth at bedtime.   fluticasone 50 MCG/ACT nasal  spray Commonly known as: FLONASE Place 2 sprays into both nostrils daily.   hydrocortisone cream 1 % Apply topically 3 (three) times daily as needed for itching.   hydrOXYzine 25 MG tablet Commonly known as: ATARAX Take 25 mg by mouth at bedtime.   lactulose 10 g packet Commonly known as: CEPHULAC Take 10 g by mouth daily as needed (constipation).   levothyroxine 50 MCG tablet Commonly known as: SYNTHROID Take 50 mcg by mouth daily before breakfast.   ondansetron 4 MG tablet Commonly known as: ZOFRAN Take 4 mg by mouth every 4 (four) hours as needed for nausea or vomiting.   oxyCODONE-acetaminophen 5-325 MG tablet Commonly known as: PERCOCET/ROXICET Take 1 tablet by mouth 2 (two) times daily. Takes scheduled per nursing home administration records   pantoprazole 40 MG tablet Commonly known as: PROTONIX Take 40 mg by mouth daily as needed (reflux).   pravastatin 40 MG tablet Commonly known as: PRAVACHOL Take 40 mg by mouth daily.   Salonpas 3.12-15-08 % Ptch Generic drug: Camphor-Menthol-Methyl Sal Apply 1 application  topically daily at 6 (six) AM. Apply to neck topically in the morning for neck pain and remove per schedule (apply @0600 , remove @2000 )   traZODone 50 MG tablet  Commonly known as: DESYREL Take 50 mg by mouth at bedtime.        Discharge Exam: Filed Weights   10/19/23 0842  Weight: 38.4 kg   General exam: awake, alert, no acute distress, frail, underweight HEENT: moist mucus membranes, hearing grossly normal  Respiratory system: on room air, normal respiratory effort. Cardiovascular system: RRR, no pedal edema.   Central nervous system: no gross focal neurologic deficits, normal speech Extremities: moves all, no edema, normal tone Skin: dry, intact, normal temperature Psychiatry: normal mood, congruent affect   Condition at discharge: stable  The results of significant diagnostics from this hospitalization (including imaging, microbiology,  ancillary and laboratory) are listed below for reference.   Imaging Studies: DG Chest Port 1 View  Result Date: 10/24/2023 CLINICAL DATA:  [id number] Screening-pulmonary TB [id number] EXAM: PORTABLE CHEST 1 VIEW COMPARISON:  Chest x-ray 04/26/2023 FINDINGS: The heart and mediastinal contours are unchanged. Atherosclerotic plaque. No focal consolidation. No pulmonary edema. No pleural effusion. No pneumothorax. No acute osseous abnormality. Bilateral shoulder reverse total arthroplasty partially visualized. Lumbar spine surgical hardware. IMPRESSION: 1. No active disease. 2.  Aortic Atherosclerosis (ICD10-I70.0). Electronically Signed   By: Tish Frederickson M.D.   On: 10/24/2023 17:58   ECHOCARDIOGRAM COMPLETE  Result Date: 10/20/2023    ECHOCARDIOGRAM REPORT   Patient Name:   Rhonda Davenport Date of Exam: 10/19/2023 Medical Rec #:  [id number]      Height:       55.0 in Accession #:    [id number]     Weight:       84.7 lb Date of Birth:  September 14, 1941      BSA:          1.216 m Patient Age:    82 years       BP:           153/79 mmHg Patient Gender: F              HR:           95 bpm. Exam Location:  [facility] Procedure: 2D Echo Indications:     TIA G45.9  History:         Patient has no prior history of Echocardiogram examinations.  Sonographer:     Overton Mam Referring Phys:  [id number] Francoise Schaumann [place] Diagnosing Phys: Debbe Odea MD IMPRESSIONS  1. Left ventricular ejection fraction, by estimation, is 60 to 65%. The left ventricle has normal function. The left ventricle has no regional wall motion abnormalities. Left ventricular diastolic parameters are consistent with Grade I diastolic dysfunction (impaired relaxation).  2. Right ventricular systolic function is normal. The right ventricular size is normal.  3. The mitral valve is normal in structure. No evidence of mitral valve regurgitation.  4. The aortic valve is tricuspid. Aortic valve regurgitation is not visualized. Aortic valve sclerosis is present, with no  evidence of aortic valve stenosis.  5. The inferior vena cava is normal in size with greater than 50% respiratory variability, suggesting right atrial pressure of 3 mmHg. FINDINGS  Left Ventricle: Left ventricular ejection fraction, by estimation, is 60 to 65%. The left ventricle has normal function. The left ventricle has no regional wall motion abnormalities. The left ventricular internal cavity size was normal in size. There is  no left ventricular hypertrophy. Left ventricular diastolic parameters are consistent with Grade I diastolic dysfunction (impaired relaxation). Right Ventricle: The right ventricular size is normal. No increase in right ventricular wall thickness. Right ventricular systolic function is  normal. Left Atrium: Left atrial size was normal in size. Right Atrium: Right atrial size was normal in size. Pericardium: There is no evidence of pericardial effusion. Mitral Valve: The mitral valve is normal in structure. No evidence of mitral valve regurgitation. Tricuspid Valve: The tricuspid valve is normal in structure. Tricuspid valve regurgitation is mild. Aortic Valve: The aortic valve is tricuspid. Aortic valve regurgitation is not visualized. Aortic valve sclerosis is present, with no evidence of aortic valve stenosis. Aortic valve peak gradient measures 7.0 mmHg. Pulmonic Valve: The pulmonic valve was normal in structure. Pulmonic valve regurgitation is not visualized. Aorta: The aortic root and ascending aorta are structurally normal, with no evidence of dilitation. Venous: The inferior vena cava is normal in size with greater than 50% respiratory variability, suggesting right atrial pressure of 3 mmHg. IAS/Shunts: No atrial level shunt detected by color flow Doppler.  LEFT VENTRICLE PLAX 2D LVIDd:         3.30 cm   Diastology LVIDs:         2.10 cm   LV e' medial:    5.55 cm/s LV PW:         0.90 cm   LV E/e' medial:  14.0 LV IVS:        1.00 cm   LV e' lateral:   7.51 cm/s LVOT diam:     1.80  cm   LV E/e' lateral: 10.3 LV SV:         41 LV SV Index:   34 LVOT Area:     2.54 cm  RIGHT VENTRICLE RV Basal diam:  2.30 cm RV S prime:     17.20 cm/s TAPSE (M-mode): 2.1 cm LEFT ATRIUM             Index        RIGHT ATRIUM          Index LA diam:        2.10 cm 1.73 cm/m   RA Area:     9.31 cm LA Vol (A2C):   23.5 ml 19.32 ml/m  RA Volume:   20.00 ml 16.45 ml/m LA Vol (A4C):   31.8 ml 26.15 ml/m LA Biplane Vol: 30.5 ml 25.08 ml/m  AORTIC VALVE                 PULMONIC VALVE AV Area (Vmax): 1.75 cm     PV Vmax:        1.14 m/s AV Vmax:        132.00 cm/s  PV Peak grad:   5.2 mmHg AV Peak Grad:   7.0 mmHg     RVOT Peak grad: 3 mmHg LVOT Vmax:      91.00 cm/s LVOT Vmean:     53.300 cm/s LVOT VTI:       0.161 m  AORTA Ao Root diam: 2.60 cm Ao Asc diam:  2.50 cm MITRAL VALVE                TRICUSPID VALVE MV Area (PHT): 5.54 cm     TV Peak grad:   16.7 mmHg MV Decel Time: 137 msec     TV Vmax:        2.04 m/s MV E velocity: 77.60 cm/s MV A velocity: 103.00 cm/s  SHUNTS MV E/A ratio:  0.75         Systemic VTI:  0.16 m  Systemic Diam: 1.80 cm Debbe Odea MD Electronically signed by Debbe Odea MD Signature Date/Time: 10/20/2023/1:25:24 PM    Final    CT ANGIO HEAD NECK W WO CM  Result Date: 10/20/2023 CLINICAL DATA:  Follow-up examination for stroke. EXAM: CT ANGIOGRAPHY HEAD AND NECK WITH AND WITHOUT CONTRAST TECHNIQUE: Multidetector CT imaging of the head and neck was performed using the standard protocol during bolus administration of intravenous contrast. Multiplanar CT image reconstructions and MIPs were obtained to evaluate the vascular anatomy. Carotid stenosis measurements (when applicable) are obtained utilizing NASCET criteria, using the distal internal carotid diameter as the denominator. RADIATION DOSE REDUCTION: This exam was performed according to the departmental dose-optimization program which includes automated exposure control, adjustment of the mA  and/or kV according to patient size and/or use of iterative reconstruction technique. CONTRAST:  75mL OMNIPAQUE IOHEXOL 350 MG/ML SOLN COMPARISON:  Prior brain MRI from earlier the same day. FINDINGS: CT HEAD FINDINGS Brain: Age-related cerebral atrophy with moderate chronic microvascular ischemic disease. Evolving subacute right PICA distribution infarct again noted, better seen on prior brain MRI. No associated hemorrhage or mass effect. No other acute large vessel territory infarct. No other acute intracranial hemorrhage. No mass lesion or midline shift. Mild ventricular prominence related to global parenchymal volume loss without hydrocephalus. No extra-axial fluid collection. Vascular: No abnormal hyperdense vessel. Calcified atherosclerosis present at the skull base. Skull: Scalp soft tissues within normal limits.  Calvarium intact. Sinuses/Orbits: Globes and orbital soft tissues within normal limits. Chronic left maxillary sinusitis noted. Trace left mastoid effusion, of doubtful significance. Other: Prominent osteoarthritic changes with degenerative pannus formation noted about the C1-2 articulations. Resultant mild to moderate stenosis at the craniocervical junction. Review of the MIP images confirms the above findings CTA NECK FINDINGS Aortic arch: Visualized aorta within normal limits for caliber. Standard branch pattern present at the aortic arch. Mild aortic atherosclerosis. Right carotid system: Right common and internal carotid arteries are tortuous without dissection or stenosis. Left carotid system: Left common and internal carotid arteries are tortuous without dissection. Mild atheromatous change about the left carotid bulb without hemodynamically significant stenosis. Vertebral arteries: Both vertebral arteries arise from subclavian arteries. Left vertebral artery dominant. Both vertebral arteries are tortuous. The left vertebral artery is patent without stenosis or dissection. On the right, there  is irregularity involving the distal right vertebral artery at the V3 segment (series 10, images 187, 185). Secondary long segment severe near occlusive stenosis at this level, best seen on coronal images (series 11, image 119, 113). 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 No visible raised dissection flap or intraluminal thrombus. Distally, the right vertebral artery is relatively normal in appearance as it enters the cranial vault. Skeleton: No worrisome osseous lesions. Severe degenerative spondylosis seen throughout the cervical spine, described on recent cervical spine MRI. Chronic C1 fracture again noted. Other neck: No other acute finding. Mild asymmetric fullness at the left nasopharynx without discrete mass. Upper chest: Extensive streak artifact from bilateral shoulder arthroplasties. Scattered atelectatic changes noted within the visualized lungs. No other acute finding. Review of the MIP images confirms the above findings CTA HEAD FINDINGS Anterior circulation: Mild atheromatous change about the carotid siphons without hemodynamically significant stenosis. A1 segments patent bilaterally. Normal anterior communicating artery complex. Anterior cerebral arteries patent without stenosis. No M1 stenosis or occlusion. Distal MCA branches perfused and symmetric. Posterior circulation: Both V4 segments patent without stenosis. Right PICA grossly patent at its origin. Left PICA not seen. Basilar widely  patent without stenosis. Superior cerebellar and posterior cerebral arteries patent bilaterally. Venous sinuses: Grossly patent allowing for timing the contrast bolus. Anatomic variants: As above.  No aneurysm. Review of the MIP images confirms the above findings IMPRESSION: CT HEAD: 1. Evolving subacute right PICA distribution infarct, better seen on prior brain MRI. No associated hemorrhage or mass effect. 2. No other acute intracranial abnormality. 3. Age-related cerebral  atrophy with moderate chronic microvascular ischemic disease. CTA HEAD AND NECK: 1. Irregularity involving the distal right vertebral artery at the V3 segment, with secondary long segment severe near occlusive stenosis at this level. 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 No visible raised dissection flap or intraluminal thrombus. 2. Mild atheromatous change about the carotid siphons and left carotid bulb without hemodynamically significant stenosis. 3. Diffuse tortuosity of the major arterial vasculature of the head and neck, suggesting chronic underlying hypertension. 4. Aortic Atherosclerosis (ICD10-I70.0). Electronically Signed   By: Rise Mu M.D.   On: 10/20/2023 05:32   MR BRAIN WO CONTRAST  Result Date: 10/19/2023 CLINICAL DATA:  Initial evaluation for neuro deficit, stroke suspected. EXAM: MRI HEAD WITHOUT CONTRAST TECHNIQUE: Multiplanar, multiecho pulse sequences of the brain and surrounding structures were obtained without intravenous contrast. COMPARISON:  Prior MRI from earlier the same day as well as CT from 10/18/2023. FINDINGS: Brain: Examination moderately to severely degraded by motion artifact. Generalized age-related cerebral atrophy. Patchy and confluent T2/FLAIR hyperintensity involving the periventricular deep white matter both cerebral hemispheres, consistent with chronic small vessel ischemic disease, moderately advanced in nature. Remote hemorrhagic lacunar infarct noted at the right basal ganglia with chronic hemosiderin staining. Mild diffusion signal abnormality involving the inferior right cerebellum, consistent with an evolving subacute right PICA distribution infarct. Developing encephalomalacia of and laminar necrosis noted within this region. No visible associated hemorrhage on this motion degraded exam. No significant regional mass effect. No other evidence for acute or subacute infarct. Diffusion signal abnormality at the right  basal ganglia of felt to be consistent with underlying susceptibility artifact. No mass lesion or midline shift. Mild ventricular prominence related to global parenchymal volume loss without hydrocephalus. No extra-axial fluid collection. Pituitary gland suprasellar region within normal limits. Vascular: Major intracranial vascular flow voids are maintained. Skull and upper cervical spine: Osteoarthritic and spondylitic changes noted about the skull base and visualized upper cervical spine, better evaluated on prior MRI. Chronic C1 fracture better seen on prior CT. Bone marrow signal intensity grossly within normal limits. No scalp soft tissue abnormality. Sinuses/Orbits: Prior bilateral ocular lens replacement. Changes of chronic left maxillary sinusitis. Scattered mucosal thickening noted elsewhere about the paranasal sinuses. Small left mastoid effusion noted, of doubtful significance. Other: None. IMPRESSION: 1. Motion degraded exam. 2. Evolving subacute right PICA distribution infarct. No visible associated hemorrhage or significant regional mass effect. 3. Underlying age-related cerebral atrophy with moderately advanced chronic microvascular ischemic disease, with remote hemorrhagic lacunar infarct at the right basal ganglia. Electronically Signed   By: Rise Mu M.D.   On: 10/19/2023 20:22   MR Cervical Spine W and Wo Contrast  Result Date: 10/19/2023 CLINICAL DATA:  82 year old female with trauma, neck pain. Chronic C1 fracture. Abnormal dens on cervical spine CT. EXAM: MRI CERVICAL SPINE WITHOUT AND WITH CONTRAST TECHNIQUE: Multiplanar and multiecho pulse sequences of the cervical spine, to include the craniocervical junction and cervicothoracic junction, were obtained without and with intravenous contrast. CONTRAST:  3mL GADAVIST GADOBUTROL 1 MMOL/ML IV SOLN COMPARISON:  CT cervical spine yesterday. CTA head  and neck 04/05/2023. Cervical spine MRI 03/26/2014.  Cervical spine CT 03/21/2014.  FINDINGS: Alignment: Chronically abnormal. Reversal of the normal cervical lordosis and up to 5 mm of anterolisthesis of C4 on C5 was present in 2015. Chronic anterolisthesis at C3-C4. Alignment at those levels not significantly changed since that time. Chronically progressed anterolisthesis of C7 on T1. Abnormal C1-C2 alignment, further detailed below. 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 Partial collapse of those levels since 2015. Similar partial collapse of C4 and C7 since that time. Development of C2-C3 ankylosis since 2015. And similar partial collapse of multiple visible upper thoracic vertebral bodies. Early erosion of the odontoid in 2015, progressed and now severe. Large pannus about the odontoid now. Chronic anterior and posterior unhealed C1 ring fracture is new since 2015, 1st identified in April this year, and contributes to abnormal C1-C2 alignment. Underlying normal background bone marrow signal. Marrow edema is visible at the C1 ring, and odontoid. But no other marrow edema identified in the cervical or upper thoracic levels. Cord: Multilevel abnormal cervical spinal cord mass effect. But no cord edema or myelomalacia is identified. No abnormal intradural enhancement. Mild posterior cervical dural thickening and enhancement appears to be degenerative in nature. Posterior Fossa, vertebral arteries, paraspinal tissues: No signal abnormality at the cervicomedullary junction. T2 heterogeneity in the pons compatible with chronic small vessel disease. Encephalomalacia in the right cerebellum PICA territory with intrinsic T1 hyperintensity but also some enhancement. Normal basilar cisterns above the cervicomedullary junction. Preserved major vascular flow voids in the neck. Tortuous vertebral arteries, the left appears to be dominant. There is mild T2 and STIR hyperintense signal abnormality of the interspinous ligament at C7-T1. No prevertebral fluid.  No other definite signal abnormality in the cervical spine ligamentous complex or in the cervical paraspinal soft tissues. Disc levels: C1-C2: Bulky and partially enhancing pannus surrounding the odontoid with un healed C1 fracture and mild displacement. Superimposed anterior and posterior ligamentous hypertrophy. Spinal stenosis at the cervicomedullary junction with up to mild mass effect there. C2-C3:  Degenerative ankylosis.  No spinal stenosis. C3-C4: Chronic anterolisthesis. Bulky ligamentous hypertrophy. Mild spinal stenosis with up to mild cord mass effect. C4-C5: Pronounced chronic anterolisthesis. Severe ligament flavum hypertrophy superimposed. Moderate spinal stenosis and spinal cord mass effect (series 8, image 15). No cord signal abnormality. C5-C6: Chronic or congenital ankylosis. Mild ligamentous hypertrophy. Mild spinal stenosis. Mild if any cord mass effect. C6-C7:  Chronic degeneration without spinal stenosis. C7-T1: Chronic anterolisthesis. Bulky ligament flavum hypertrophy but no spinal stenosis. Similar extensive visible thoracic spine degeneration. There is mild multifactorial upper thoracic spinal stenosis at T1-T2 (series 8, image 29) with bulky ligament flavum hypertrophy and mild if any spinal stenosis. No other upper thoracic spinal stenosis through the T7-T8 level. IMPRESSION: 1. 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 2. Marrow edema in the C1 ring and associated with progressive chronic erosion of the odontoid which appears related to degenerative versus inflammatory soft tissue pannus there. Widespread partial collapse of cervical and upper thoracic vertebral bodies since 2015, but appears chronic with no other marrow edema identified. 3. Evidence of C7-T1 interspinous ligament injury with mild signal abnormality there. 4.  Degenerative stenosis from the cervicomedullary junction through T1-T2. Intermittent mass effect on the cervicomedullary junction and spinal cord, up to moderate at the C4-C5 level. But no cord edema or myelomalacia identified. No evidence  of cord contusion. 5. Subacute appearing Right PICA Cerebellar Infarct. Developing encephalomalacia, laminar necrosis. Electronically Signed   By: Odessa Fleming M.D.   On: 10/19/2023 05:04   CT ABDOMEN PELVIS W CONTRAST  Result Date: 10/19/2023 CLINICAL DATA:  Nausea, vomiting, loss of appetite, weakness. EXAM: CT ABDOMEN AND PELVIS WITH CONTRAST TECHNIQUE: Multidetector CT imaging of the abdomen and pelvis was performed using the standard protocol following bolus administration of intravenous contrast. RADIATION DOSE REDUCTION: This exam was performed according to the departmental dose-optimization program which includes automated exposure control, adjustment of the mA and/or kV according to patient size and/or use of iterative reconstruction technique. CONTRAST:  75mL OMNIPAQUE IOHEXOL 300 MG/ML  SOLN COMPARISON:  CT abdomen and pelvis 12/11/2022 FINDINGS: Lower chest: No acute abnormality. Hepatobiliary: No focal liver abnormality is seen. No gallstones, gallbladder wall thickening, or biliary dilatation. Pancreas: Unremarkable. Spleen: Unremarkable. Adrenals/Urinary Tract: Stable adrenal glands. Cortical renal scarring in both kidneys. No urinary calculi or hydronephrosis. Unremarkable bladder. Stomach/Bowel: Normal caliber large and small bowel. Colonic diverticulosis without diverticulitis. Moderate colonic stool burden. The appendix is not definitively visualized. Moderate-to-large hiatal hernia containing a portion of the stomach in the chest. Vascular/Lymphatic: Aortic atherosclerosis. No enlarged abdominal or pelvic lymph nodes. Reproductive: Hysterectomy. Other: No free intraperitoneal fluid or air. Musculoskeletal: No acute fracture. Posterior fusion L3-S1. Grade 1-2  anterolisthesis of L4. Multilevel advanced spondylosis and disc space height loss with degenerative endplate changes. IMPRESSION: 1. No acute abnormality in the abdomen or pelvis. 2. Moderate colonic stool burden. 3. Moderate-to-large hiatal hernia containing a portion of the stomach in the chest. 4.  Colonic diverticulosis without diverticulitis. Aortic Atherosclerosis (ICD10-I70.0). Electronically Signed   By: Minerva Fester M.D.   On: 10/19/2023 02:22   CT HEAD WO CONTRAST ( )  Result Date: 10/19/2023 CLINICAL DATA:  Head trauma, minor (Age >= 65y); Neck trauma (Age >= 65y) Pt reprots woke up this morning at 1am feeling nausea and vomtied several times. EXAM: CT HEAD WITHOUT CONTRAST CT CERVICAL SPINE WITHOUT CONTRAST TECHNIQUE: Multidetector CT imaging of the head and cervical spine was performed following the standard protocol without intravenous contrast. Multiplanar CT image reconstructions of the cervical spine were also generated. RADIATION DOSE REDUCTION: This exam was performed according to the departmental dose-optimization program which includes automated exposure control, adjustment of the mA and/or kV according to patient size and/or use of iterative reconstruction technique. COMPARISON:  CT angio head and neck 04/05/2023, MRI cervical spine 03/26/2014 FINDINGS: CT HEAD FINDINGS Brain: Patchy and confluent areas of decreased attenuation are noted throughout the deep and periventricular white matter of the cerebral hemispheres bilaterally, compatible with chronic microvascular ischemic disease. No evidence of large-territorial acute infarction. No parenchymal hemorrhage. No mass lesion. No extra-axial collection. No mass effect or midline shift. No hydrocephalus. Basilar cisterns are patent. Vascular: No hyperdense vessel. Skull: No acute fracture or focal lesion. Sinuses/Orbits: Persistent complete opacification visualized left maxillary sinus with associated sinus wall hypertrophy and  intraluminal calcifications likely chronic or fungal sinusitis. Paranasal sinuses and mastoid air cells are clear. The orbits are unremarkable. Other: None. 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 Interval worsening of grade 1 anterolisthesis of C3 on C4 with 4 mm (from 2 mm) anterolisthesis. Skull base and vertebrae: Severe multilevel degenerative changes of the spine. Severe bilateral C4-C5 osseous neural foraminal stenosis. No severe osseous central canal stenosis. Interval worsening of severe resorption of the  dens. Interval new resorption of the right aspect of the C1 posterior arch (2:35). 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 No acute fracture. No aggressive appearing focal osseous lesion or focal pathologic process. Soft tissues and spinal canal: No prevertebral fluid or swelling. No visible canal hematoma. Upper chest: Unremarkable. Other: Atherosclerotic plaque of the aortic arch and its branches. IMPRESSION: 1.  No acute intracranial abnormality. 2. No acute displaced fracture or traumatic listhesis of the cervical spine. 3. Interval worsening of severe resorption of the dens. Interval new resorption of the right aspect of the C1 posterior arch. Concern for underlying infection. Recommend MRI cervical spine for further evaluation. 4. Interval worsening of a known grade 2 anterolisthesis of C4 on C5 with 6 mm (from 4 mm) anterolisthesis. Interval worsening of grade 1 anterolisthesis of C3 on C4 with 4 mm (from 2 mm) anterolisthesis. Multilevel severe degenerative changes of the spine. This can further be evaluated on MRI. 5. Severe bilateral C4-C5 osseous neural foraminal stenosis. 6.  Aortic Atherosclerosis (ICD10-I70.0). Electronically Signed   By: Tish Frederickson M.D.   On: 10/19/2023 01:31   CT Cervical Spine Wo Contrast  Result Date: 10/19/2023 CLINICAL DATA:  Head trauma, minor (Age  >= 65y); Neck trauma (Age >= 65y) Pt reprots woke up this morning at 1am feeling nausea and vomtied several times. EXAM: CT HEAD WITHOUT CONTRAST CT CERVICAL SPINE WITHOUT CONTRAST TECHNIQUE: Multidetector CT imaging of the head and cervical spine was performed following the standard protocol without intravenous contrast. Multiplanar CT image reconstructions of the cervical spine were also generated. RADIATION DOSE REDUCTION: This exam was performed according to the departmental dose-optimization program which includes automated exposure control, adjustment of the mA and/or kV according to patient size and/or use of iterative reconstruction technique. COMPARISON:  CT angio head and neck 04/05/2023, MRI cervical spine 03/26/2014 FINDINGS: CT HEAD FINDINGS Brain: Patchy and confluent areas of decreased attenuation are noted throughout the deep and periventricular white matter of the cerebral hemispheres bilaterally, compatible with chronic microvascular ischemic disease. No evidence of large-territorial acute infarction. No parenchymal hemorrhage. No mass lesion. No extra-axial collection. No mass effect or midline shift. No hydrocephalus. Basilar cisterns are patent. Vascular: No hyperdense vessel. Skull: No acute fracture or focal lesion. Sinuses/Orbits: Persistent complete opacification visualized left maxillary sinus with associated sinus wall hypertrophy and intraluminal calcifications likely chronic or fungal sinusitis. Paranasal sinuses and mastoid air cells are clear. The orbits are unremarkable. Other: None. 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 Interval worsening of grade 1 anterolisthesis of C3 on C4 with 4 mm (from 2 mm) anterolisthesis. Skull base and vertebrae: Severe multilevel degenerative changes of the spine. Severe bilateral C4-C5 osseous neural foraminal stenosis. No severe osseous central canal  stenosis. Interval worsening of severe resorption of the dens. Interval new resorption of the right aspect of the C1 posterior arch (2:35). 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 No acute fracture. No aggressive appearing focal osseous lesion or focal pathologic process. Soft tissues and spinal canal: No prevertebral fluid or swelling. No visible canal hematoma. Upper chest: Unremarkable. Other: Atherosclerotic plaque of the aortic arch and its branches. IMPRESSION: 1.  No acute intracranial abnormality. 2. No acute displaced fracture or traumatic listhesis of the cervical spine. 3. Interval worsening of severe resorption of the dens. Interval new resorption of the right aspect of the C1 posterior arch. Concern  for underlying infection. Recommend MRI cervical spine for further evaluation. 4. Interval worsening of a known grade 2 anterolisthesis of C4 on C5 with 6 mm (from 4 mm) anterolisthesis. Interval worsening of grade 1 anterolisthesis of C3 on C4 with 4 mm (from 2 mm) anterolisthesis. Multilevel severe degenerative changes of the spine. This can further be evaluated on MRI. 5. Severe bilateral C4-C5 osseous neural foraminal stenosis. 6.  Aortic Atherosclerosis (ICD10-I70.0). Electronically Signed   By: Tish Frederickson M.D.   On: 10/19/2023 01:31    Microbiology: Results for orders placed or performed during the hospital encounter of 10/18/23  Resp panel by RT-PCR (RSV, Flu A&B, Covid) Anterior Nasal Swab     Status: None   Collection Time: 10/18/23 10:39 PM   Specimen: Anterior Nasal Swab  Result Value Ref Range Status   SARS Coronavirus 2 by RT PCR NEGATIVE NEGATIVE Final    Comment: (NOTE) SARS-CoV-2 target nucleic acids are NOT DETECTED.  The SARS-CoV-2 RNA is generally detectable in upper respiratory specimens during the acute phase of infection. The lowest concentration of SARS-CoV-2 viral copies this assay can detect is 138 copies/mL. A negative result does not preclude  SARS-Cov-2 infection and should not be used as the sole basis for treatment or other patient management decisions. A negative result may occur with  improper specimen collection/handling, submission of specimen other than nasopharyngeal swab, presence of viral mutation(s) within the areas targeted by this assay, and inadequate number of viral copies(<138 copies/mL). A negative result must be combined with clinical observations, patient history, and epidemiological information. The expected result is Negative.  Fact Sheet for Patients:  BloggerCourse.com  Fact Sheet for Healthcare Providers:  SeriousBroker.it  This test is no t yet approved or cleared by the [place] FDA and  has been authorized for detection and/or diagnosis of SARS-CoV-2 by FDA under an Emergency Use Authorization (EUA). This EUA will remain  in effect (meaning this test can be used) for the duration of the COVID-19 declaration under Section 564(b)(1) of the Act, 21 U.S.C.section 360bbb-3(b)(1), unless the authorization is terminated  or revoked sooner.       Influenza A by PCR NEGATIVE NEGATIVE Final   Influenza B by PCR NEGATIVE NEGATIVE Final    Comment: (NOTE) The Xpert Xpress SARS-CoV-2/FLU/RSV plus assay is intended as an aid in the diagnosis of influenza from Nasopharyngeal swab specimens and should not be used as a sole basis for treatment. Nasal washings and aspirates are unacceptable for Xpert Xpress SARS-CoV-2/FLU/RSV testing.  Fact Sheet for Patients: BloggerCourse.com  Fact Sheet for Healthcare Providers: SeriousBroker.it  This test is not yet approved or cleared by the [place] FDA and has been authorized for detection and/or diagnosis of SARS-CoV-2 by FDA under an Emergency Use Authorization (EUA). This EUA will remain in effect (meaning this test can be used) for the duration of  the COVID-19 declaration under Section 564(b)(1) of the Act, 21 U.S.C. section 360bbb-3(b)(1), unless the authorization is terminated or revoked.     Resp Syncytial Virus by PCR NEGATIVE NEGATIVE Final    Comment: (NOTE) Fact Sheet for Patients: BloggerCourse.com  Fact Sheet for Healthcare Providers: SeriousBroker.it  This test is not yet approved or cleared by the [place] FDA and has been authorized for detection and/or diagnosis of SARS-CoV-2 by FDA under an Emergency Use Authorization (EUA). This EUA will remain in effect (meaning this test can be used) for the duration of the COVID-19 declaration under Section 564(b)(1) of the Act, 21 U.S.C.  section 360bbb-3(b)(1), unless the authorization is terminated or revoked.  Performed at [facility], [address] [place], [place] [address]     Labs: CBC: Recent Labs  Lab 10/18/23 1656 10/24/23 0439  WBC 8.7 6.3  HGB 11.1* 10.1*  HCT 33.5* 31.1*  MCV 84.2 87.1  PLT 356 319   Basic Metabolic Panel: Recent Labs  Lab 10/18/23 1656 10/24/23 0439  NA 135 136  K 3.9 4.6  CL 96* 108  CO2 25 19*  GLUCOSE 109* 91  BUN 28* 17  CREATININE 1.09* 0.72  CALCIUM 10.1 8.3*  MG  --  2.4   Liver Function Tests: Recent Labs  Lab 10/18/23 1656  AST 18  ALT 7  ALKPHOS 46  BILITOT 0.5  PROT 8.0  ALBUMIN 4.2   CBG: Recent Labs  Lab 10/19/23 1623  GLUCAP 80    Discharge time spent: less than 30 minutes.  Signed: Pennie Banter, DO [facility] 10/25/2023

## 2023-10-25 NOTE — Progress Notes (Signed)
Mobility Specialist - Progress Note   10/25/23 1100  Mobility  Activity Ambulated with assistance in hallway  Level of Assistance Standby assist, set-up cues, supervision of patient - no hands on  Assistive Device Front wheel walker  Distance Ambulated (ft) 480 ft  Activity Response Tolerated well  $Mobility charge 1 Mobility     Pt sitting in recliner upon arrival, utilizing RA. Pt motivated for activity. STS and ambulation in hallway with minG. No LOB. No complaints. Pt returned to chair with needs in reach; family at bedside.    Filiberto Pinks Mobility Specialist 10/25/23, 11:31 AM

## 2023-10-25 NOTE — Progress Notes (Signed)
Speech Language Pathology Treatment: Dysphagia  Patient Details Name: Rhonda Davenport MRN: [id number] DOB: 22-Jul-1941 Today's Date: 10/25/2023 Time: 4132-4401 SLP Time Calculation (min) (ACUTE ONLY): 35 min  Assessment / Plan / Recommendation Clinical Impression  Pt seen for ongoing assessment of swallowing and toleration of diet w/ education on general aspiration precautions. She is alert, verbally responsive and engaged in conversation w/ SLP stating she "would really like some more coffee". Noted pt had eaten some of her eggs from breakfast meal but not much. Pt often spoke about incidents w/ NSG and repeated the same phrases/details ~4 times during session. Per MD, pt has some Baseline, Chronic Cognitive decline.    Pt is on RA; afebrile; wbc wnl. Dentures in place.   Pt explained general aspiration precautions and was able to recall 3 w/ verbal cues to elaborate; she verbalized the need to sit upright for all oral intake. SLP encouraged her to continue to sit in a chair for all of her meals. Discussed precautions including: SMALL single sips "like drinking hot coffee"; SINGLE sips; SMALL bites of foods. Pt fed herself another few bites of soft solids and thin liquids(coffee) via Cup(NO straw). No overt coughing noted; no decline in respiratory presentation; vocal quality remained clear b/t trials. Pt often talked b/t trials -- instructed her to talk less immediately post swallowing. Oral phase appeared grossly [facility] for bolus management, mastication of softened solids, and timely A-P transfer for swallowing -- encouraged SMALL bites for better oral control and mastication ease vs larger bites. Oral clearing achieved w/ all consistencies - encouraged lingual sweeping to maintain clearing b/t bites.  Pt requests Regular foods including fruit plates and salads. Explained the difference b/t cooked, cohesive foods vs particulate foods and to ensure oral clearing b/t bites. 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 Pt agreed to focus on small bites, chewing these foods well, and oral clearing b/t bites.    Pt appears at reduced risk for aspiration when following aspiration precautions as given Education/hands-on practice with today. Pt gave verbal understanding of the increased risk for coughing/"strangling" if drinking too big of a sip, too fast.  Recommend continue a fairly Regular diet but w/ Cut Meats for ease of eating and d/t RA in hands restricting use for such tasks as cutting per pt; Thin liquids via Cup. Moistened foods for ease of chewing/eating in general w/ Dentures, age. Recommend aspiration precautions to include No talking when eating/drinking and No straws when drinking liquids. Recommend Pills Whole in Puree; tray setup and positioning assistance for meals.  ST services will sign off at this time as pt appears at/close to her Baseline. Education left w/ pt. NSG to reconsult ST services if new needs arise during admit. NSG/Team updated. Precautions posted at bedside.  Re: pt's Cognitive-linguistic status and needs, recommend f/u at her next venue of care, as any Cognitive decline can impact communication abilities. F/u for Cognitive intervention could be beneficial for cognitive-linguistic abilities and Safety in ADLs in her D/C environment. Recommend f/u w/ Neurology for any formal assessment of pt's Cognitive function/dx.       HPI HPI: Rhonda Davenport is a 82 y.o. 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 who presents with difficulty walking that started two nights ago.  She states that she woke up in the middle the night (10/18/2023) and felt the need to vomit, and when she  tried to stand up she was unable to walk.  Since that time, she has noticed some improvement both in her walking and her nausea.  She does complain of a bifrontal headache, stating that this is typical of her migraines. MRI (10/19/2023) revealed 1. Motion degraded exam.  2. Evolving subacute right PICA distribution infarct. No visible  associated hemorrhage or significant regional mass effect.  3. 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      SLP Plan  All goals met      Recommendations for follow up therapy are one component of a multi-disciplinary discharge planning process, led by the attending physician.  Recommendations may be updated based on patient status, additional functional criteria and insurance authorization.    Recommendations  Diet recommendations: Dysphagia 3 (mechanical soft);Regular;Thin liquid (cut meats w/ gravies added for ease of cutting (RA in hands) and chewing (dentures)) Liquids provided via: Cup;No straw Medication Administration: Whole meds with puree Supervision: Patient able to self feed;Intermittent supervision to cue for compensatory strategies (general tray setup and sitting up) Compensations: Minimize environmental distractions;Slow rate;Small sips/bites;Lingual sweep for clearance of pocketing;Follow solids with liquid Postural Changes and/or Swallow Maneuvers: Out of bed for meals;Seated upright 90 degrees;Upright 30-60 min after meal                 (Dietician f/u; Palliative Care f/u for GOC support) Oral care BID;Patient independent with oral care (denture care support)   Set up Supervision/Assistance Dysphagia, unspecified (R13.10)     All goals met       Jerilynn Som, MS, CCC-SLP Speech Language Pathologist Rehab Services; [facility] [phone] (ascom) Sarayah Bacchi  10/25/2023, 12:22  PM

## 2023-10-25 NOTE — TOC Transition Note (Signed)
Transition of Care [facility]) - CM/SW Discharge Note   Patient Details  Name: Rhonda Davenport MRN: [id number] Date of Birth: 1941/05/12  [facility]) CM/SW Contact:  Allena Katz, LCSW Phone Number: 10/25/2023, 11:34 AM   Clinical Narrative:  Pt has orders to discharge home. TB test emailed to [facility] with [facility]. CSW spoke with patient and her niece [name]. Pt is going to stay with her niece over the weekend and will admit to [facility] on Monday. No other needs at this time.     Final next level of care:  (staying with niece over weekend and then [employer]) Barriers to Discharge: Barriers Resolved   Patient Goals and CMS Choice CMS Medicare.gov Compare Post Acute Care list provided to:: Patient Choice offered to / list presented to : Patient  Discharge Placement                         Discharge Plan and Services Additional resources added to the After Visit Summary for                                       Social Determinants of Health (SDOH) Interventions SDOH Screenings   Food Insecurity: No Food Insecurity (10/19/2023)  Housing: Low Risk  (10/19/2023)  Transportation Needs: No Transportation Needs (10/19/2023)  Utilities: Not At Risk (10/19/2023)  Tobacco Use: Medium Risk (10/18/2023)     Readmission Risk Interventions     No data to display

## 2023-10-25 NOTE — Progress Notes (Signed)
Approximately 1300-- Discharge and AVS instructions provided to pt with teach-back technique utilized. All questions answered at this time. All PIVs removed, sites WDL. Pt to be transported home with family member, with plans to admit to [facility] on Monday. All pt belongings taken with pt--pt verified.

## 2023-10-28 LAB — QUANTIFERON-TB GOLD PLUS: QuantiFERON-TB Gold Plus: NEGATIVE

## 2023-10-28 LAB — QUANTIFERON-TB GOLD PLUS (RQFGPL)
QuantiFERON Mitogen Value: >10 [IU]/mL
QuantiFERON Nil Value: 0.03 [IU]/mL
QuantiFERON TB1 Ag Value: 0.04 [IU]/mL
QuantiFERON TB2 Ag Value: 0.03 [IU]/mL

## 2023-12-25 ENCOUNTER — Encounter: Payer: Self-pay | Admitting: Neurology

## 2023-12-25 ENCOUNTER — Encounter: Payer: Self-pay | Admitting: *Deleted

## 2023-12-25 ENCOUNTER — Ambulatory Visit (INDEPENDENT_AMBULATORY_CARE_PROVIDER_SITE_OTHER): Payer: Medicare Other | Admitting: Neurology

## 2023-12-25 VITALS — BP 102/72 | HR 79 | Ht <= 58 in | Wt 90.0 lb

## 2023-12-25 DIAGNOSIS — Z9189 Other specified personal risk factors, not elsewhere classified: Secondary | ICD-10-CM

## 2023-12-25 DIAGNOSIS — Z79899 Other long term (current) drug therapy: Secondary | ICD-10-CM

## 2023-12-25 DIAGNOSIS — R296 Repeated falls: Secondary | ICD-10-CM

## 2023-12-25 DIAGNOSIS — G4486 Cervicogenic headache: Secondary | ICD-10-CM | POA: Diagnosis not present

## 2023-12-25 NOTE — Progress Notes (Signed)
 Subjective:    Patient ID: Rhonda Davenport is a 83 y.o. female.  HPI    Rhonda Fairy, MD, PhD [facility] [address] [address] [place], [place] [address]  Dear Rhonda Davenport,  I saw your patient, Rhonda Davenport, upon your kind request in my neurologic clinic today for evaluation of her recurrent headaches.  The patient is accompanied by a friend today, who brought her from [facility], assisted living.  As you know, Rhonda Davenport is an 83 year old female with an underlying complex medical history of rheumatoid arthritis, coronary artery disease, chronic kidney disease, TIA, stroke, osteoporosis chronic low back pain, neck pain with history of nondisplaced C1 fracture, stills disease, thyroid  disease, hyperlipidemia, reflux disease, anemia, and gait disorder, who reports recurrent headaches for the past 2 years.  Her headaches often start on the right side and in the back of her head and can last all day.  She reports that her headaches started after she fell about 2 years ago and injured her neck.  I reviewed your visit note from 12/16/2023.  She had trigger point injection at the time.  She does not believe that the trigger point injection has helped yet.  Of note, she is on several medications including Lyrica 25 mg strength 2 capsules 3 times a day, she also takes hydroxyzine  25 mg 4 times a day, she is on baclofen 5 mg strength 1 pill 3 times daily as needed and oxycodone  5 mg strength half a pill to 1 tablet twice daily as needed.  However, in reviewing her MAR from [facility], she is no longer on baclofen, Lyrica, or oxycodone .  She is on multiple psychotropic medications currently include desvenlafaxine, Butrans patch, buspirone , hydroxyzine , and trazodone .  She reports that she slid out of her bed recently and landed on her tailbone, she did not seek medical attention at the time and did not wish to be taken to the emergency room as I understand.  She reports  that she did not injure herself.  She admits that she does not drink a lot of water, estimates that she drinks about 1 or 2 cups of water per day.  She does not drink any alcohol .  She limits her caffeine to 1 cup of coffee in the morning, tea up to 1 pint per day, occasional soda.  She reports that oxycodone  helped her pain.  She reports that she was taken off of it about 3 months ago.  She had seen [facility] neurology in the past for TIA in September 2016.    She had seen Dr. Tilda Fogo in this office for gait disorder in January 2016.  Of note, she had a brain MRI without contrast through [facility] on 10/19/2023 and I reviewed the results:  IMPRESSION: 1. Motion degraded exam. 2. Evolving subacute right PICA distribution infarct. No visible associated hemorrhage or significant regional mass effect. 3. Underlying age-related cerebral atrophy with moderately advanced chronic microvascular ischemic disease, with remote hemorrhagic lacunar infarct at the right basal ganglia.  In addition, I personally and independently reviewed images through the PACS system.    She had a C-spine MRI with and without contrast through [facility] on 10/19/2023 and I reviewed the results:   IMPRESSION: 1. 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   2. Marrow edema in the C1 ring and associated  with progressive chronic erosion of the odontoid which appears related to degenerative versus inflammatory soft tissue pannus there. Widespread partial collapse of cervical and upper thoracic vertebral bodies since 2015, but appears chronic with no other marrow edema identified.   3. Evidence of C7-T1 interspinous ligament injury with mild signal abnormality there.   4. Degenerative stenosis from the cervicomedullary  junction through T1-T2. Intermittent mass effect on the cervicomedullary junction and spinal cord, up to moderate at the C4-C5 level. But no cord edema or myelomalacia identified. No evidence of cord contusion.   5. Subacute appearing Right PICA Cerebellar Infarct. Developing encephalomalacia, laminar necrosis.  She was admitted to the hospital in November 2024 for worsening lower extremity weakness.  She was found to have a subacute right PICA infarct.  Her weakness improved.  She was advised to continue with aspirin  and Plavix  and a statin.  She did not have any speech therapy needs and was recommended for home health PT and OT.    She works that she is in therapy currently.  She uses a 2 wheeled walker to mobilize.  She has limited range of motion in her neck and upper body.  She is status post bilateral shoulder surgeries and status post left hip replacement, status post carpal tunnel surgery.  She has eye examinations about twice a year, last exam was about 6 months ago and has an appointment next month.  She denies any visual disturbance.  Her Past Medical History Is Significant For: Past Medical History:  Diagnosis Date   Anemia    Chronic low back pain    Fall    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   GERD (gastroesophageal reflux disease)    H/O blood clots    pt states had blood clot in her rt arm   High cholesterol    Hypertension    Still's disease (HCC)    Thyroid  disease    Hypothyroidism    Her Past Surgical History Is Significant For: Past Surgical History:  Procedure Laterality Date   ABDOMINAL HYSTERECTOMY     APPENDECTOMY     CARPAL TUNNEL RELEASE Bilateral    JOINT REPLACEMENT     TKR, left   SHOULDER SURGERY     Bilateral   TONSILLECTOMY      Her Family History Is Significant For: Family History  Problem Relation Age of Onset   Rheum arthritis Mother    Rheum arthritis Father    Healthy Brother     Her Social History Is Significant For: Social History    Socioeconomic History   Marital status: Widowed    Spouse name: Not on file   Number of children: 0   Years of education: Not on file   Highest education level: Not on file  Occupational History   Occupation: Retired    Comment: Airline pilot  Tobacco Use   Smoking status: Former    Current packs/day: 0.00    Average packs/day: 1 pack/day for 26.0 years (26.0 ttl pk-yrs)    Types: Cigarettes    Start date: 12/10/1956    Quit date: 12/10/1982    Years since quitting: 41.0   Smokeless tobacco: Never  Substance and Sexual Activity   Alcohol  use: No   Drug use: No   Sexual activity: Not on file  Other Topics Concern   Not on file  Social History Narrative   Patient is right handed.   Patient drinks 1 cup caffeine daily   Social Drivers of Corporate investment banker  Strain: Not on file  Food Insecurity: Low Risk  (10/28/2023)   Received from [facility]   Hunger Vital Sign    Worried About Running Out of Food in the Last Year: Never true    Ran Out of Food in the Last Year: Never true  Transportation Needs: No Transportation Needs (10/28/2023)   Received from [facility]    In the past 12 months, has lack of reliable transportation kept you from medical appointments, meetings, work or from getting things needed for daily living? : No  Physical Activity: Not on file  Stress: Not on file  Social Connections: Not on file    Her Allergies Are:  Allergies  Allergen Reactions   Ibuprofen  Itching   Oxycodone      Other reaction(s): Lethargy (intolerance), Malaise (intolerance) "sleeps for 2 days"   Tramadol Itching   Atorvastatin     Other reaction(s): Myalgias (intolerance)   Ceftriaxone  Other (See Comments)   Benztropine Rash   Codeine Palpitations   Diphenhydramine Hcl Itching    Tolerates ATARAX    Hydrocodone-Acetaminophen  Rash   Leflunomide Rash   Meloxicam  Itching  :   Her Current Medications Are:  Outpatient Encounter Medications as of  12/25/2023  Medication Sig   acetaminophen  (TYLENOL ) 500 MG tablet Take 1,000 mg by mouth every 8 (eight) hours as needed for mild pain (pain score 1-3) or moderate pain (pain score 4-6). Give 2 tablets (1g) every 8 hours as needed for pain management   aspirin  EC 81 MG tablet Take 1 tablet (81 mg total) by mouth daily. Swallow whole.   buprenorphine (BUTRANS) 5 MCG/HR PTWK Place 1 patch onto the skin once a week.   busPIRone  (BUSPAR ) 10 MG tablet Take 15 mg by mouth 2 (two) times daily.   calcium carbonate (TUMS - DOSED IN MG ELEMENTAL CALCIUM) 500 MG chewable tablet Chew 1 tablet by mouth daily as needed for indigestion. Takes PRN for indigestion   Camphor-Menthol-Methyl Sal (SALONPAS) 3.12-15-08 % PTCH Apply 1 application  topically daily at 6 (six) AM. Apply to neck topically in the morning for neck pain and remove per schedule (apply @0600 , remove @2000 )   clopidogrel  (PLAVIX ) 75 MG tablet Take 1 tablet by mouth daily.   desvenlafaxine (PRISTIQ) 50 MG 24 hr tablet Take 50 mg by mouth daily.   ergocalciferol  (VITAMIN D2) 1.25 MG (50000 UT) capsule Take 50,000 Units by mouth once a week.   fluticasone  (FLONASE ) 50 MCG/ACT nasal spray Place 2 sprays into both nostrils daily.   hydrOXYzine  (ATARAX /VISTARIL ) 25 MG tablet Take 25 mg by mouth at bedtime.   lactulose  (CEPHULAC ) 10 g packet Take 10 g by mouth daily as needed (constipation).   levothyroxine  (SYNTHROID , LEVOTHROID) 50 MCG tablet Take 50 mcg by mouth daily before breakfast.    ondansetron  (ZOFRAN ) 4 MG tablet Take 4 mg by mouth every 4 (four) hours as needed for nausea or vomiting.   pantoprazole  (PROTONIX ) 40 MG tablet Take 40 mg by mouth 2 (two) times daily.   pravastatin  (PRAVACHOL ) 40 MG tablet Take 40 mg by mouth daily.   senna (SENOKOT) 8.6 MG TABS tablet Take 2 tablets by mouth at bedtime as needed for mild constipation.   traZODone  (DESYREL ) 50 MG tablet Take 25 mg by mouth at bedtime.   Upadacitinib ER (RINVOQ) 15 MG TB24 Take 1  tablet by mouth daily at 6 (six) AM.   [DISCONTINUED] Baclofen 5 MG TABS Take 5 mg by mouth 3 (three) times daily  as needed.   [DISCONTINUED] docusate sodium  (COLACE) 100 MG capsule Take 100 mg by mouth at bedtime.   [DISCONTINUED] famotidine  (PEPCID ) 20 MG tablet Take 20 mg by mouth 2 (two) times daily.   [DISCONTINUED] losartan  (COZAAR ) 50 MG tablet Take 50 mg by mouth daily.   [DISCONTINUED] metoprolol  tartrate (LOPRESSOR ) 50 MG tablet Take 50 mg by mouth 2 (two) times daily.   [DISCONTINUED] pantoprazole  (PROTONIX ) 40 MG tablet Take 40 mg by mouth daily as needed (reflux).   [DISCONTINUED] pregabalin (LYRICA) 25 MG capsule Take 50 mg by mouth 3 (three) times daily.   [DISCONTINUED] hydrocortisone  cream 1 % Apply topically 3 (three) times daily as needed for itching.   [DISCONTINUED] oxyCODONE -acetaminophen  (PERCOCET/ROXICET) 5-325 MG tablet Take 1 tablet by mouth 2 (two) times daily. Takes scheduled per nursing home administration records (Patient not taking: Reported on 12/25/2023)   No facility-administered encounter medications on file as of 12/25/2023.  :   Review of Systems:  Out of a complete 14 point review of systems, all are reviewed and negative with the exception of these symptoms as listed below:  Review of Systems  Neurological:        Referral for headaches. For several years, after neck fracture, and pain since then).      Objective:  Neurological Exam  Physical Exam Physical Examination:   Vitals:   12/25/23 1256  BP: 102/72  Pulse: 79    General Examination: The patient is a very pleasant 83 y.o. female in no acute distress. She appears frail.  Well-groomed.   HEENT: Normocephalic, atraumatic, limited range of motion in her neck, no carotid bruits.  Pupils are reactive to light, no photophobia noted, funduscopic exam reasonably benign appearing, status post cataract repairs bilaterally.  Airway examination reveals dentures in place, moderate mouth dryness,  tongue protrudes centrally and palate elevates symmetrically, face is symmetric, no dysarthria, hearing grossly intact.    Chest: Clear to auscultation without wheezing, rhonchi or crackles noted.  Heart: S1+S2+0, regular and normal without murmurs, rubs or gallops noted.   Abdomen: Soft, non-tender and non-distended.  Extremities: There is no pitting edema in the distal lower extremities bilaterally.   Skin: Warm and dry without trophic changes noted.   Musculoskeletal: exam reveals significantly limited range of motion in the shoulders bilaterally, arthritic changes which are prominent in both hands, scoliosis and increased lumbar kyphosis and mid back curvature.  Limited range of motion in the neck.     Neurologically:  Mental status: The patient is awake, alert and oriented in all 4 spheres. Her immediate and remote memory, attention, language skills and fund of knowledge are appropriate. There is no evidence of aphasia, agnosia, apraxia or anomia. Speech is clear with normal prosody and enunciation. Thought process is linear. Mood is normal and affect is normal.  Cranial nerves II - XII are as described above under HEENT exam.  Motor exam: Thin bulk, global strength of about 4 out of 5, significantly reduced range of motion in both shoulders and neck area.  Arthritic changes in both hands.  Abnormal posture with increased forward curvature and scoliosis noted.   Fine motor skills and coordination: Mildly impaired globally.  Reflexes are 1+ in the upper extremities and diminished in the lower extremities.   Cerebellar testing: No dysmetria or intention tremor. There is no truncal or gait ataxia.  Sensory exam: intact to light touch in the upper and lower extremities.  Gait, station and balance: She stands with difficulty and has to  push herself up.  She walks with her walker.   Assessment and Plan:  In summary, Rhonda Davenport is a very pleasant 83 y.o.-year old female with an underlying  complex medical history of rheumatoid arthritis, coronary artery disease, chronic kidney disease, TIA, stroke, osteoporosis chronic low back pain, neck pain with history of nondisplaced C1 fracture, stills disease, thyroid  disease, hyperlipidemia, reflux disease, anemia, and gait disorder, who presents for evaluation of her recurrent headaches.  She has significant cervical spine disease and history of cervical spine fracture.  She has been on multiple pain medications and currently on multiple psychotropic medications.  We talked about the importance of fall prevention and contributors to recurrent headaches.  She has had recurrent falls unfortunately.   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  She is currently no longer on Lyrica, baclofen and hydrocodone.  She is advised to follow-up with your office on a regular basis but also talk to her prescribing providers about streamlining her medication regimen.  She is on medications for sleep, including trazodone  and hydroxyzine .  She is on multiple sedating medications and that is a big concern.  She is not hydrating well with water and she is advised to increase her water intake to about 6 to 8 cups of water per day as dehydration can exacerbate headaches.  Unfortunately, there is not a whole lot else I can offer at this time.  We talked about the importance of continuing with physical therapy and using her walker at all times.  She is advised to follow-up with her PCP and other providers including your office.  I answered all her questions today and the patient was in agreement.  This was an extended visit over 60 minutes with copious record review involved and significant counseling and coordination of care.   I filled out her consultation sheet from her assisted living facility as well.   Thank you very much for allowing me to participate in the care of this nice patient. If I can be of any further assistance to you please do not hesitate to call me at  [phone].  Sincerely,   Rhonda Fairy, MD, PhD

## 2023-12-25 NOTE — Patient Instructions (Addendum)
 It was nice to meet you today.  Your headache is likely from your severe degenerative neck disease but may also be exacerbated by your decreased water intake.  You already take multiple medications for pain management.  I would not recommend any additional medication.  I would like for your primary care and spine specialist to critically review your medication regimen.  Please increase your water intake to about 6 to 8 cups of water per day, 8 ounce size each.  Limit your caffeine intake to 1-2 servings per day.  Try to get about 7 to 8 hours of sleep.  Use your walker at all times.  Continue with physical therapy and follow-up with your spine specialist, [facility].   We can see you back in this clinic as needed.

## 2024-01-01 ENCOUNTER — Telehealth: Payer: Self-pay | Admitting: Neurology

## 2024-01-01 NOTE — Telephone Encounter (Signed)
Dr office of pt requested Medical records re: recent appointment

## 2024-09-17 DIAGNOSIS — F321 Major depressive disorder, single episode, moderate: Secondary | ICD-10-CM | POA: Diagnosis not present

## 2024-09-17 DIAGNOSIS — S81809D Unspecified open wound, unspecified lower leg, subsequent encounter: Secondary | ICD-10-CM

## 2024-09-17 DIAGNOSIS — R296 Repeated falls: Secondary | ICD-10-CM | POA: Diagnosis not present

## 2024-09-17 DIAGNOSIS — N183 Chronic kidney disease, stage 3 unspecified: Secondary | ICD-10-CM | POA: Diagnosis not present

## 2024-09-17 DIAGNOSIS — G894 Chronic pain syndrome: Secondary | ICD-10-CM | POA: Diagnosis not present

## 2024-09-24 DIAGNOSIS — K219 Gastro-esophageal reflux disease without esophagitis: Secondary | ICD-10-CM

## 2024-09-24 DIAGNOSIS — R1310 Dysphagia, unspecified: Secondary | ICD-10-CM

## 2024-12-06 ENCOUNTER — Emergency Department (HOSPITAL_COMMUNITY)
Admission: EM | Admit: 2024-12-06 | Discharge: 2024-12-07 | Disposition: A | Discharge status: Skilled Nursing Facility | Source: Skilled Nursing Facility | Attending: Emergency Medicine | Admitting: Emergency Medicine

## 2024-12-06 ENCOUNTER — Encounter (HOSPITAL_COMMUNITY): Payer: Self-pay | Admitting: Emergency Medicine

## 2024-12-06 ENCOUNTER — Emergency Department (HOSPITAL_COMMUNITY)

## 2024-12-06 ENCOUNTER — Other Ambulatory Visit: Payer: Self-pay

## 2024-12-06 DIAGNOSIS — N1831 Chronic kidney disease, stage 3a: Secondary | ICD-10-CM | POA: Insufficient documentation

## 2024-12-06 DIAGNOSIS — Z96652 Presence of left artificial knee joint: Secondary | ICD-10-CM | POA: Insufficient documentation

## 2024-12-06 DIAGNOSIS — W19XXXA Unspecified fall, initial encounter: Secondary | ICD-10-CM | POA: Diagnosis not present

## 2024-12-06 DIAGNOSIS — I5022 Chronic systolic (congestive) heart failure: Secondary | ICD-10-CM | POA: Diagnosis not present

## 2024-12-06 DIAGNOSIS — Z23 Encounter for immunization: Secondary | ICD-10-CM | POA: Insufficient documentation

## 2024-12-06 DIAGNOSIS — Z7902 Long term (current) use of antithrombotics/antiplatelets: Secondary | ICD-10-CM | POA: Insufficient documentation

## 2024-12-06 DIAGNOSIS — J45909 Unspecified asthma, uncomplicated: Secondary | ICD-10-CM | POA: Diagnosis not present

## 2024-12-06 DIAGNOSIS — Z79899 Other long term (current) drug therapy: Secondary | ICD-10-CM | POA: Insufficient documentation

## 2024-12-06 DIAGNOSIS — S0100XA Unspecified open wound of scalp, initial encounter: Secondary | ICD-10-CM | POA: Insufficient documentation

## 2024-12-06 DIAGNOSIS — E039 Hypothyroidism, unspecified: Secondary | ICD-10-CM | POA: Diagnosis not present

## 2024-12-06 DIAGNOSIS — Z7982 Long term (current) use of aspirin: Secondary | ICD-10-CM | POA: Insufficient documentation

## 2024-12-06 DIAGNOSIS — I130 Hypertensive heart and chronic kidney disease with heart failure and stage 1 through stage 4 chronic kidney disease, or unspecified chronic kidney disease: Secondary | ICD-10-CM | POA: Insufficient documentation

## 2024-12-06 DIAGNOSIS — S0990XA Unspecified injury of head, initial encounter: Secondary | ICD-10-CM

## 2024-12-06 MED ORDER — ACETAMINOPHEN 500 MG PO TABS
1000.0000 mg | ORAL_TABLET | Freq: Once | ORAL | Status: AC
  Administered 2024-12-06: 1000 mg via ORAL
  Filled 2024-12-06: qty 2

## 2024-12-06 MED ORDER — TETANUS-DIPHTH-ACELL PERTUSSIS 5-2-15.5 LF-MCG/0.5 IM SUSP
0.5000 mL | Freq: Once | INTRAMUSCULAR | Status: AC
  Administered 2024-12-06: 0.5 mL via INTRAMUSCULAR
  Filled 2024-12-06: qty 0.5

## 2024-12-06 NOTE — ED Notes (Addendum)
 Trauma Event Note   Pt ready for CT, called at 2138 and planned to transport but received notice that code blue was currently taking place in scanner. Pt scan delayed d/t code blue on another pt.  Surya Folden O Remonia Otte  Trauma Response RN  Please call TRN at [phone] for further assistance.

## 2024-12-06 NOTE — ED Notes (Signed)
 Un able to take pt to CT due to CPR in progress on CT area with another pt.

## 2024-12-06 NOTE — ED Notes (Signed)
 Trauma Response Nurse Documentation   Rhonda Davenport is a 83 y.o. female arriving to [facility] ED via EMS  On clopidogrel  75 mg daily. Trauma was activated as a Level 2 by ED charge RN based on the following trauma criteria Elderly patients > 65 with head trauma on anti-coagulation (excluding ASA).  Patient cleared for CT by Dr. Francesca EDP. Pt transported to CT with trauma response nurse present to monitor. RN remained with the patient throughout their absence from the department for clinical observation.   GCS 15.   History   Past Medical History:  Diagnosis Date   Anemia    Chronic low back pain    Fall    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   GERD (gastroesophageal reflux disease)    H/O blood clots    pt states had blood clot in her rt arm   High cholesterol    Hypertension    Still's disease (HCC)    Thyroid  disease    Hypothyroidism     Past Surgical History:  Procedure Laterality Date   ABDOMINAL HYSTERECTOMY     APPENDECTOMY     CARPAL TUNNEL RELEASE Bilateral    JOINT REPLACEMENT     TKR, left   SHOULDER SURGERY     Bilateral   TONSILLECTOMY         Initial Focused Assessment (If applicable, or please see trauma documentation): Alert/oriented female presents via EMS after a fall with posterior head lac, bleeding controlled.   Airway patent, BS  No obvious uncontrolled hemorrhage GCS 15 PERRLA   CT's Completed:   CT Head and CT C-Spine   Interventions:  IV start and trauma lab draw Portable chest and pelvis XRAY CT head and c-spine Wound care, lac repair by EDP TDAP Tylenol  for pain control  Plan for disposition:  Discharge anticipated  Consults completed:  None   Event Summary: Presents via EMS from SNF after a fall with posterior head lac, bleeding controlled. Delay in CT d/t another code blue. Escorted to CT by TRN. Trauma scans unremarkable, anticipate D/C.   Bedside handoff with ED RN Jinnie.    Davonna Ertl O Krishav Mamone  Trauma Response RN  Please  call TRN at [phone] for further assistance.

## 2024-12-06 NOTE — ED Triage Notes (Signed)
 Level 2 trauma fall on thinner 45 min pta to ED with small lac to posterior  head. Pt coming form [facility].

## 2024-12-06 NOTE — Discharge Instructions (Addendum)
 Be careful when walking at home to avoid any further falls. Return to the ER as needed.

## 2024-12-06 NOTE — ED Provider Notes (Signed)
 " [place] EMERGENCY DEPARTMENT AT [facility] Provider Note  CSN: [id number] Arrival date & time: 12/06/24 2113  Chief Complaint(s) No chief complaint on file.  HPI Rhonda Davenport is a 83 y.o. female with history of prior stroke on Plavix , CKD presenting to the emergency department with head injury.  Patient reports that she was at home, was walking and lost her balance and hit the back of her head on her dresser.  Reports that overall she feels fine.  She denies any headache, neck pain, back pain, pain in her upper or lower extremities, chest pain, abdominal pain.  She lives at an assisted living facility and they brought her to the emergency department.   Past Medical History Past Medical History:  Diagnosis Date   Anemia    Chronic low back pain    Fall    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   GERD (gastroesophageal reflux disease)    H/O blood clots    pt states had blood clot in her rt arm   High cholesterol    Hypertension    Still's disease (HCC)    Thyroid  disease    Hypothyroidism   Patient Active Problem List   Diagnosis Date Noted   Acute CVA (cerebrovascular accident) (HCC) 10/21/2023   CVA (cerebral vascular accident) (HCC) 10/19/2023   Degenerative scoliosis in adult patient 04/16/2023   C1 cervical fracture (HCC) 04/05/2023   Neck pain 04/05/2023   Chronic pain syndrome 02/27/2021   History of recurrent UTIs 11/16/2019   Polypharmacy 02/23/2019   Acute cystitis without hematuria 03/03/2018   Sciatica of right side 03/03/2018   Vitamin B12 deficiency 02/13/2018   Pleural effusion, left 06/19/2017   Abnormal EKG 06/16/2017   Chronic kidney disease (CKD) stage G3b/A3, moderately decreased glomerular filtration rate (GFR) between 30-44 mL/min/1.73 square meter and albuminuria creatinine ratio greater than 300 mg/g (HCC) 05/13/2017   Grief reaction 02/21/2017   Diverticulitis of large intestine without perforation or abscess without bleeding 07/18/2016   LLQ  abdominal pain 05/21/2016   Eczema 05/07/2016   Diaphoresis 03/28/2016   Orthostatic hypotension 03/22/2016   Anxiety 03/15/2016   Chronic cerebral ischemia 03/15/2016   Cystocele 03/15/2016   Osteoarthritis 03/15/2016   Macular degeneration 03/15/2016   Senile osteoporosis 03/15/2016   Thrombocytosis 03/15/2016   Vitamin D  deficiency 03/15/2016   Adjustment disorder with anxious mood 03/14/2016   GERD (gastroesophageal reflux disease) 03/14/2016   Nausea 03/14/2016   Primary insomnia 02/29/2016   Drug-induced confusion, acute 12/24/2015   Overuse of medication 12/24/2015   Adjustment and management of cardiac pacemaker 12/24/2015   Contusion    Acute renal failure (ARF) 12/19/2015   Altered mental status 12/19/2015   Lethargy 12/15/2015   Ataxia 10/27/2015   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   Frequent falls 10/27/2015   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   Major depressive disorder, single episode 10/06/2015   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   TIA (transient ischemic attack) 09/06/2015   Iron deficiency anemia 06/22/2015   Chronic systolic heart failure (HCC) 06/21/2015   Localized edema 06/21/2015   Essential hypertension 05/30/2015   Malaise and fatigue 05/30/2015   Lumbar spondylolysis 02/07/2015   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   Leg weakness, bilateral 01/05/2015   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 08/02/2014   Lumbar degenerative disc disease 06/07/2014   Lumbar radiculopathy 06/07/2014   Elevated blood pressure 05/06/2014   Rheumatoid arthritis involving multiple joints (HCC) 05/06/2014   Seizure (HCC) 03/24/2014   Altered  mental state 03/24/2014   Generalized anxiety disorder 02/15/2014   Hypothyroidism 05/16/2013   Hyperlipidemia 05/16/2013   Cough 12/25/2012   Home Medication(s) Prior to Admission medications  Medication Sig Start Date End Date Taking? Authorizing Provider  acetaminophen  (TYLENOL ) 500 MG  tablet Take 1,000 mg by mouth every 8 (eight) hours as needed for mild pain (pain score 1-3) or moderate pain (pain score 4-6). Give 2 tablets (1g) every 8 hours as needed for pain management    [provider]  aspirin  EC 81 MG tablet Take 1 tablet (81 mg total) by mouth daily. Swallow whole. 10/26/23   Fausto Burnard LABOR, DO  buprenorphine (BUTRANS) 5 MCG/HR PTWK Place 1 patch onto the skin once a week.    [provider]  busPIRone  (BUSPAR ) 10 MG tablet Take 15 mg by mouth 2 (two) times daily.    [provider]  calcium carbonate (TUMS - DOSED IN MG ELEMENTAL CALCIUM) 500 MG chewable tablet Chew 1 tablet by mouth daily as needed for indigestion. Takes PRN for indigestion    [provider]  Camphor-Menthol-Methyl Sal (SALONPAS) 3.12-15-08 % PTCH Apply 1 application  topically daily at 6 (six) AM. Apply to neck topically in the morning for neck pain and remove per schedule (apply @0600 , remove @2000 )    [provider]  clopidogrel  (PLAVIX ) 75 MG tablet Take 1 tablet by mouth daily. 04/06/15   [provider]  desvenlafaxine (PRISTIQ) 50 MG 24 hr tablet Take 50 mg by mouth daily.    [provider]  ergocalciferol  (VITAMIN D2) 1.25 MG (50000 UT) capsule Take 50,000 Units by mouth once a week.    [provider]  fluticasone  (FLONASE ) 50 MCG/ACT nasal spray Place 2 sprays into both nostrils daily.    [provider]  hydrOXYzine  (ATARAX /VISTARIL ) 25 MG tablet Take 25 mg by mouth at bedtime.    [provider]  lactulose  (CEPHULAC ) 10 g packet Take 10 g by mouth daily as needed (constipation).    [provider]  levothyroxine  (SYNTHROID , LEVOTHROID) 50 MCG tablet Take 50 mcg by mouth daily before breakfast.     [provider]  ondansetron  (ZOFRAN ) 4 MG tablet Take 4 mg by mouth every 4 (four) hours as needed for nausea or vomiting.    [provider]  pantoprazole  (PROTONIX ) 40 MG tablet  Take 40 mg by mouth 2 (two) times daily.    [provider]  pravastatin  (PRAVACHOL ) 40 MG tablet Take 40 mg by mouth daily.    [provider]  senna (SENOKOT) 8.6 MG TABS tablet Take 2 tablets by mouth at bedtime as needed for mild constipation.    [provider]  traZODone  (DESYREL ) 50 MG tablet Take 25 mg by mouth at bedtime.    [provider]  Upadacitinib ER (RINVOQ) 15 MG TB24 Take 1 tablet by mouth daily at 6 (six) AM.    [provider]  Past Surgical History Past Surgical History:  Procedure Laterality Date   ABDOMINAL HYSTERECTOMY     APPENDECTOMY     CARPAL TUNNEL RELEASE Bilateral    JOINT REPLACEMENT     TKR, left   SHOULDER SURGERY     Bilateral   TONSILLECTOMY     Family History Family History  Problem Relation Age of Onset   Rheum arthritis Mother    Rheum arthritis Father    Healthy Brother     Social History Social History[1] Allergies Ibuprofen , Oxycodone , Tramadol, Atorvastatin, Ceftriaxone , Benztropine, Codeine, Diphenhydramine hcl, Hydrocodone-acetaminophen , Leflunomide, and Meloxicam   Review of Systems Review of Systems  All other systems reviewed and are negative.   Physical Exam Vital Signs  I have reviewed the triage vital signs BP 110/64 Comment: manual  Pulse 77   Temp 98 F (36.7 C)   Resp 16   Ht 4' 7 (1.397 m)   Wt 41 kg   SpO2 96%   BMI 21.01 kg/m  Physical Exam Vitals and nursing note reviewed.  Constitutional:      General: She is not in acute distress.    Appearance: She is well-developed.  HENT:     Head: Normocephalic and atraumatic.     Mouth/Throat:     Mouth: Mucous membranes are moist.  Eyes:     Pupils: Pupils are equal, round, and reactive to light.  Cardiovascular:     Rate and Rhythm: Normal rate and regular rhythm.     Heart sounds:  No murmur heard. Pulmonary:     Effort: Pulmonary effort is normal. No respiratory distress.     Breath sounds: Normal breath sounds.  Abdominal:     General: Abdomen is flat.     Palpations: Abdomen is soft.     Tenderness: There is no abdominal tenderness.  Musculoskeletal:        General: No tenderness.     Right lower leg: No edema.     Left lower leg: No edema.     Comments: No midline C, T, L-spine tenderness.  No chest wall tenderness or crepitus.  Full painless range of motion at the bilateral upper extremities including the shoulders, elbows, wrists, hand and fingers, and in the bilateral lower extremities including the hips, knees, ankle, toes.  No focal bony tenderness, injury or deformity.  Skin:    General: Skin is warm and dry.     Comments: Punctate wound to posterior scalp with mild surrounding hematoma.  Neurological:     General: No focal deficit present.     Mental Status: She is alert. Mental status is at baseline.  Psychiatric:        Mood and Affect: Mood normal.        Behavior: Behavior normal.     ED Results and Treatments Labs (all labs ordered are listed, but only abnormal results are displayed) Labs Reviewed - No data to display  Radiology CT Head Wo Contrast Result Date: 12/06/2024 EXAM: CT HEAD AND CERVICAL SPINE 12/06/2024 10:10:46 PM TECHNIQUE: CT of the head and cervical spine was performed without the administration of intravenous contrast. Multiplanar reformatted images are provided for review. Automated exposure control, iterative reconstruction, and/or weight based adjustment of the mA/kV was utilized to reduce the radiation dose to as low as reasonably achievable. COMPARISON: CT head and CT cervical spine 10/18/2023.MRI cervical spine 10/19/2023. CLINICAL HISTORY: Neck trauma (Age >= 65y) FINDINGS: CT HEAD BRAIN AND VENTRICLES: No  acute intracranial hemorrhage. No mass effect or midline shift. No abnormal extra-axial fluid collection. No evidence of acute infarct. No hydrocephalus. Patchy white matter hypodensities, compatible with chronic small vessel ischemic disease. Cerebral atrophy. Remote right cerebellar infarcts. ORBITS: No acute abnormality. SINUSES AND MASTOIDS: Chronic left maxillary sinusitis with near total opacification and surrounding osteitis. SOFT TISSUES AND SKULL: No acute skull fracture. Posterior right scalp contusion. CT CERVICAL SPINE BONES AND ALIGNMENT: Similar grade 1 anterolisthesis of C3 on C4 and grade 2 anterolisthesis of C4 on C5. Similar perched right C4-C5 facet joint with severe bony remodeling. Chronic C5-C6 ankylosis. Chronic C1 fracture with nonunion DEGENERATIVE CHANGES: Severe multilevel degenerative and stenosis better characterized on prior MRI. This includes severe craniocervical degenerative change with large pannus, severe degenerative disc disease and facet/uncovertebral hypertrophy. SOFT TISSUES: No prevertebral soft tissue swelling. IMPRESSION: 1. No acute intracranial abnormality. Posterior right scalp contusion. 2. No acute fracture or traumatic malalignment of the cervical spine. 3. Similar grade 1 anterolisthesis of C3 on C4 and grade 2 anterolisthesis of C4 on C5. 4. Similar perched right C4-C5 facet joint with bony remodeling. 5. Chronic very severe multilevel degenerative change and stenosis better characterized on prior MRI on Oct 19, 2023. 6. Chronic C5-C6 ankylosis. 7. Chronic C1 fracture with nonunion. Electronically signed by: Gilmore Molt 12/06/2024 11:30 PM EST RP Workstation: [id number]   CT Cervical Spine Wo Contrast Result Date: 12/06/2024 EXAM: CT HEAD AND CERVICAL SPINE 12/06/2024 10:10:46 PM TECHNIQUE: CT of the head and cervical spine was performed without the administration of intravenous contrast. Multiplanar reformatted images are provided for review. Automated  exposure control, iterative reconstruction, and/or weight based adjustment of the mA/kV was utilized to reduce the radiation dose to as low as reasonably achievable. COMPARISON: CT head and CT cervical spine 10/18/2023.MRI cervical spine 10/19/2023. CLINICAL HISTORY: Neck trauma (Age >= 65y) FINDINGS: CT HEAD BRAIN AND VENTRICLES: No acute intracranial hemorrhage. No mass effect or midline shift. No abnormal extra-axial fluid collection. No evidence of acute infarct. No hydrocephalus. Patchy white matter hypodensities, compatible with chronic small vessel ischemic disease. Cerebral atrophy. Remote right cerebellar infarcts. ORBITS: No acute abnormality. SINUSES AND MASTOIDS: Chronic left maxillary sinusitis with near total opacification and surrounding osteitis. SOFT TISSUES AND SKULL: No acute skull fracture. Posterior right scalp contusion. CT CERVICAL SPINE BONES AND ALIGNMENT: Similar grade 1 anterolisthesis of C3 on C4 and grade 2 anterolisthesis of C4 on C5. Similar perched right C4-C5 facet joint with severe bony remodeling. Chronic C5-C6 ankylosis. Chronic C1 fracture with nonunion DEGENERATIVE CHANGES: Severe multilevel degenerative and stenosis better characterized on prior MRI. This includes severe craniocervical degenerative change with large pannus, severe degenerative disc disease and facet/uncovertebral hypertrophy. SOFT TISSUES: No prevertebral soft tissue swelling. IMPRESSION: 1. No acute intracranial abnormality. Posterior right scalp contusion. 2. No acute fracture or traumatic malalignment of the cervical spine. 3. Similar grade 1 anterolisthesis of C3 on C4 and grade 2 anterolisthesis of C4 on C5. 4. Similar perched right  C4-C5 facet joint with bony remodeling. 5. Chronic very severe multilevel degenerative change and stenosis better characterized on prior MRI on Oct 19, 2023. 6. Chronic C5-C6 ankylosis. 7. Chronic C1 fracture with nonunion. Electronically signed by: Gilmore Molt 12/06/2024  11:30 PM EST RP Workstation: [id number]    Pertinent labs & imaging results that were available during my care of the patient were reviewed by me and considered in my medical decision making (see MDM for details).  Medications Ordered in ED Medications  Tdap (ADACEL ) injection 0.5 mL (0.5 mLs Intramuscular Given 12/06/24 2130)  acetaminophen  (TYLENOL ) tablet 1,000 mg (1,000 mg Oral Given 12/06/24 2130)                                                                                                                                     Procedures Procedures  (including critical care time)  Medical Decision Making / ED Course   MDM:  83 year old presenting to the emergency department with head injury.  Patient is overall well-appearing, physical examination is reassuring without signs of acute traumatic injury other than mild head injury and mild head wound.  Patient does not need closure given size of wound.  Her tetanus was updated and the wound was irrigated.  Clinical Course as of 12/06/24 2343  [place] Dec 06, 2024  2342 CT scans are negative.  Patient is stable for discharge to home. Will discharge patient to home. All questions answered. Patient comfortable with plan of discharge. Return precautions discussed with patient and specified on the after visit summary.  [WS]    Clinical Course User Index [WS] Francesca Elsie CROME, MD     Additional history obtained: -Additional history obtained from ems -External records from outside source obtained and reviewed including: Chart review including previous notes, labs, imaging, consultation notes including prior notes    Imaging Studies ordered: I ordered imaging studies including CT scans  On my interpretation imaging demonstrates chronic neck abnormalities without signs of acute fracture  I independently visualized and interpreted imaging. I agree with the radiologist interpretation   Medicines ordered and prescription drug  management: Meds ordered this encounter  Medications   Tdap (ADACEL ) injection 0.5 mL   acetaminophen  (TYLENOL ) tablet 1,000 mg    -I have reviewed the patients home medicines and have made adjustments as needed     Reevaluation: After the interventions noted above, I reevaluated the patient and found that their symptoms have improved  Co morbidities that complicate the patient evaluation  Past Medical History:  Diagnosis Date   Anemia    Chronic low back pain    Fall    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   GERD (gastroesophageal reflux disease)    H/O blood clots    pt states had blood clot in her rt arm   High cholesterol    Hypertension    Still's disease (HCC)    Thyroid  disease    Hypothyroidism  Dispostion: Disposition decision including need for hospitalization was considered, and patient discharged from emergency department.    Final Clinical Impression(s) / ED Diagnoses Final diagnoses:  Minor head injury, initial encounter     This chart was dictated using voice recognition software.  Despite best efforts to proofread,  errors can occur which can change the documentation meaning.     [1]  Social History Tobacco Use   Smoking status: Former    Current packs/day: 0.00    Average packs/day: 1 pack/day for 26.0 years (26.0 ttl pk-yrs)    Types: Cigarettes    Start date: 12/10/1956    Quit date: 12/10/1982    Years since quitting: 42.0   Smokeless tobacco: Never  Substance Use Topics   Alcohol  use: No   Drug use: No     Francesca Elsie CROME, MD 12/06/24 2343  "

## 2024-12-07 NOTE — ED Notes (Signed)
 [facility] at bedside for transport.

## 2024-12-07 NOTE — Progress Notes (Signed)
 Orthopedic Tech Progress Note Patient Details:  Rhonda Davenport May 14, 1941 [id number]  Patient ID: [name], female   DOB: [birth year], 83 y.o.   MRN: [id number] I attended trauma page. Chandra Dorn PARAS 12/07/2024, 3:59 AM

## 2024-12-15 ENCOUNTER — Other Ambulatory Visit: Payer: Self-pay | Admitting: Internal Medicine
